# Patient Record
Sex: Female | Born: 1950 | Race: White | Hispanic: No | Smoking: Never smoker
Health system: Southern US, Community
[De-identification: ages and names within clinical notes are randomized; demographics above are authoritative.]

## PROBLEM LIST (undated history)

## (undated) DIAGNOSIS — T7840XA Allergy, unspecified, initial encounter: Secondary | ICD-10-CM

## (undated) DIAGNOSIS — F419 Anxiety disorder, unspecified: Secondary | ICD-10-CM

## (undated) DIAGNOSIS — F32A Depression, unspecified: Secondary | ICD-10-CM

## (undated) DIAGNOSIS — I10 Essential (primary) hypertension: Secondary | ICD-10-CM

## (undated) DIAGNOSIS — R519 Headache, unspecified: Secondary | ICD-10-CM

## (undated) DIAGNOSIS — K219 Gastro-esophageal reflux disease without esophagitis: Secondary | ICD-10-CM

## (undated) DIAGNOSIS — E785 Hyperlipidemia, unspecified: Secondary | ICD-10-CM

## (undated) DIAGNOSIS — M8580 Other specified disorders of bone density and structure, unspecified site: Secondary | ICD-10-CM

## (undated) DIAGNOSIS — R51 Headache: Secondary | ICD-10-CM

## (undated) DIAGNOSIS — J45909 Unspecified asthma, uncomplicated: Secondary | ICD-10-CM

## (undated) DIAGNOSIS — E079 Disorder of thyroid, unspecified: Secondary | ICD-10-CM

## (undated) DIAGNOSIS — M1990 Unspecified osteoarthritis, unspecified site: Secondary | ICD-10-CM

## (undated) DIAGNOSIS — R160 Hepatomegaly, not elsewhere classified: Secondary | ICD-10-CM

## (undated) HISTORY — DX: Depression, unspecified: F32.A

## (undated) HISTORY — DX: Gastro-esophageal reflux disease without esophagitis: K21.9

## (undated) HISTORY — DX: Headache, unspecified: R51.9

## (undated) HISTORY — DX: Anxiety disorder, unspecified: F41.9

## (undated) HISTORY — DX: Essential (primary) hypertension: I10

## (undated) HISTORY — DX: Hyperlipidemia, unspecified: E78.5

## (undated) HISTORY — DX: Unspecified asthma, uncomplicated: J45.909

## (undated) HISTORY — DX: Hepatomegaly, not elsewhere classified: R16.0

## (undated) HISTORY — DX: Allergy, unspecified, initial encounter: T78.40XA

## (undated) HISTORY — PX: DILATION AND CURETTAGE OF UTERUS: SHX78

## (undated) HISTORY — DX: Disorder of thyroid, unspecified: E07.9

## (undated) HISTORY — DX: Headache: R51

## (undated) HISTORY — PX: OTHER SURGICAL HISTORY: SHX169

## (undated) HISTORY — DX: Unspecified osteoarthritis, unspecified site: M19.90

## (undated) HISTORY — DX: Other specified disorders of bone density and structure, unspecified site: M85.80

---

## 1984-06-03 HISTORY — PX: NASAL SINUS SURGERY: SHX719

## 1998-01-18 ENCOUNTER — Encounter: Admission: RE | Admit: 1998-01-18 | Discharge: 1998-04-18 | Payer: Self-pay | Admitting: Internal Medicine

## 1999-06-01 ENCOUNTER — Encounter: Admission: RE | Admit: 1999-06-01 | Discharge: 1999-06-01 | Payer: Self-pay | Admitting: Internal Medicine

## 1999-06-01 ENCOUNTER — Encounter: Payer: Self-pay | Admitting: Internal Medicine

## 1999-06-06 ENCOUNTER — Encounter: Admission: RE | Admit: 1999-06-06 | Discharge: 1999-06-27 | Payer: Self-pay | Admitting: Internal Medicine

## 1999-11-28 ENCOUNTER — Other Ambulatory Visit: Admission: RE | Admit: 1999-11-28 | Discharge: 1999-11-28 | Payer: Self-pay | Admitting: Obstetrics & Gynecology

## 2001-01-28 ENCOUNTER — Encounter: Payer: Self-pay | Admitting: Internal Medicine

## 2001-01-28 ENCOUNTER — Encounter: Admission: RE | Admit: 2001-01-28 | Discharge: 2001-01-28 | Payer: Self-pay | Admitting: Internal Medicine

## 2001-04-28 ENCOUNTER — Other Ambulatory Visit: Admission: RE | Admit: 2001-04-28 | Discharge: 2001-04-28 | Payer: Self-pay | Admitting: Obstetrics & Gynecology

## 2002-05-03 ENCOUNTER — Other Ambulatory Visit: Admission: RE | Admit: 2002-05-03 | Discharge: 2002-05-03 | Payer: Self-pay | Admitting: Obstetrics & Gynecology

## 2002-08-20 ENCOUNTER — Encounter: Admission: RE | Admit: 2002-08-20 | Discharge: 2002-08-20 | Payer: Self-pay | Admitting: Internal Medicine

## 2002-08-20 ENCOUNTER — Encounter: Payer: Self-pay | Admitting: Internal Medicine

## 2003-05-18 ENCOUNTER — Other Ambulatory Visit: Admission: RE | Admit: 2003-05-18 | Discharge: 2003-05-18 | Payer: Self-pay | Admitting: Obstetrics & Gynecology

## 2004-05-29 ENCOUNTER — Other Ambulatory Visit: Admission: RE | Admit: 2004-05-29 | Discharge: 2004-05-29 | Payer: Self-pay | Admitting: Obstetrics & Gynecology

## 2004-07-16 ENCOUNTER — Ambulatory Visit (HOSPITAL_COMMUNITY): Admission: RE | Admit: 2004-07-16 | Discharge: 2004-07-16 | Payer: Self-pay | Admitting: Gastroenterology

## 2004-07-16 LAB — HM COLONOSCOPY

## 2004-08-07 ENCOUNTER — Other Ambulatory Visit: Admission: RE | Admit: 2004-08-07 | Discharge: 2004-08-07 | Payer: Self-pay | Admitting: Obstetrics & Gynecology

## 2005-01-11 ENCOUNTER — Encounter: Admission: RE | Admit: 2005-01-11 | Discharge: 2005-01-11 | Payer: Self-pay | Admitting: Internal Medicine

## 2005-07-14 ENCOUNTER — Ambulatory Visit: Payer: Self-pay | Admitting: Cardiology

## 2005-07-14 ENCOUNTER — Observation Stay (HOSPITAL_COMMUNITY): Admission: EM | Admit: 2005-07-14 | Discharge: 2005-07-15 | Payer: Self-pay | Admitting: Emergency Medicine

## 2005-07-15 ENCOUNTER — Ambulatory Visit: Payer: Self-pay

## 2006-10-04 ENCOUNTER — Emergency Department (HOSPITAL_COMMUNITY): Admission: EM | Admit: 2006-10-04 | Discharge: 2006-10-04 | Payer: Self-pay | Admitting: Family Medicine

## 2008-03-08 ENCOUNTER — Ambulatory Visit: Payer: Self-pay | Admitting: Internal Medicine

## 2008-09-27 ENCOUNTER — Ambulatory Visit: Payer: Self-pay | Admitting: Internal Medicine

## 2008-10-17 ENCOUNTER — Ambulatory Visit: Payer: Self-pay | Admitting: Internal Medicine

## 2009-03-22 ENCOUNTER — Ambulatory Visit: Payer: Self-pay | Admitting: Internal Medicine

## 2009-05-02 ENCOUNTER — Ambulatory Visit: Payer: Self-pay | Admitting: Internal Medicine

## 2009-05-09 ENCOUNTER — Ambulatory Visit: Payer: Self-pay | Admitting: Internal Medicine

## 2009-11-02 ENCOUNTER — Ambulatory Visit: Payer: Self-pay | Admitting: Internal Medicine

## 2010-03-23 ENCOUNTER — Ambulatory Visit: Payer: Self-pay | Admitting: Internal Medicine

## 2010-05-21 ENCOUNTER — Ambulatory Visit: Payer: Self-pay | Admitting: Internal Medicine

## 2010-08-06 ENCOUNTER — Ambulatory Visit (INDEPENDENT_AMBULATORY_CARE_PROVIDER_SITE_OTHER): Payer: 59 | Admitting: Internal Medicine

## 2010-09-06 ENCOUNTER — Ambulatory Visit (INDEPENDENT_AMBULATORY_CARE_PROVIDER_SITE_OTHER): Payer: 59 | Admitting: Internal Medicine

## 2010-09-06 DIAGNOSIS — R42 Dizziness and giddiness: Secondary | ICD-10-CM

## 2010-09-06 DIAGNOSIS — J209 Acute bronchitis, unspecified: Secondary | ICD-10-CM

## 2010-09-19 ENCOUNTER — Ambulatory Visit (INDEPENDENT_AMBULATORY_CARE_PROVIDER_SITE_OTHER): Payer: 59

## 2010-09-19 ENCOUNTER — Inpatient Hospital Stay (INDEPENDENT_AMBULATORY_CARE_PROVIDER_SITE_OTHER)
Admission: RE | Admit: 2010-09-19 | Discharge: 2010-09-19 | Disposition: A | Discharge status: Home / Self Care | Payer: 59 | Source: Ambulatory Visit | Attending: Family Medicine | Admitting: Family Medicine

## 2010-09-19 DIAGNOSIS — J45909 Unspecified asthma, uncomplicated: Secondary | ICD-10-CM

## 2010-09-27 ENCOUNTER — Ambulatory Visit (INDEPENDENT_AMBULATORY_CARE_PROVIDER_SITE_OTHER): Payer: 59 | Admitting: Internal Medicine

## 2010-09-27 DIAGNOSIS — J209 Acute bronchitis, unspecified: Secondary | ICD-10-CM

## 2010-10-19 NOTE — Consult Note (Signed)
Olivia Bender, Olivia Bender                ACCOUNT NO.:  [id number]   MEDICAL RECORD NO.:  [id number]          PATIENT TYPE:  OBV   LOCATION:  3703                         FACILITY:  [facility]   PHYSICIAN:  Pricilla Riffle, M.D.    DATE OF BIRTH:  09/24/1950   DATE OF CONSULTATION:  DATE OF DISCHARGE:  07/15/2005                                   CONSULTATION   PRIMARY CARDIOLOGIST:  Dr. Dietrich Pates.   PRIMARY CARE:  Dr. Marlan Palau.   DISCHARGING DIAGNOSES:  1.  Chest pain with both typical and atypical features with cardiac enzymes      negative x3 sets.  2.  Hyperlipidemia.  3.  Gastroesophageal reflux disease.  4.  Anxiety.  5.  History of palpitations.   HOSPITAL COURSE:  Olivia Bender is a very pleasant 60 year old Caucasian female  with no known coronary artery disease history, who woke up on day of  admission complaining of chest pain.  The patient states the symptoms were  worse with deep inspiration and use of left arm.  Olivia Bender has a long  history of brief sharp pains in chest and was actually evaluated by Dr. Tenny Craw  in 2003 with a benign workup.  Her pain on admission, however, was an 8 with  pressure.  She rated it a 5-6 on a scale of 1-10 that started at around 7  a.m.  She eventually took 2 sublingual nitroglycerin that belonged to her  husband.  She states the second nitroglycerin relieved her chest discomfort.  She presented to [facility] for further evaluation and was admitted to  telemetry, ruled out for a myocardial infarction.  Dr. Tenny Craw in to examine  patient prior to discharge.  The patient without further episodes of  discomfort, up ambulating in halls, telemetry showing normal sinus rhythm, a  12-lead EKG without ST or T-wave changes.  Hemoglobin stable at 12.4 and  hematocrit 35.4.  The patient afebrile, blood pressure 112/60 and satting  95% on room air.  The patient is being discharged home with saline lock  intact.  We have her scheduled for an exercise Myoview in our  office at  12:30 today and she has been n.p.o. for this test.  She is to continue her  previous medications including Protonix, Lexapro, Zyrtec, Zetia and Fosamax.  I have started her on a low-dose aspirin 81 mg daily, gave her a  prescription  for nitroglycerin if needed for chest discomfort, I have scheduled her for a  follow up appointment with April Humphry on February 13 at 9:45 to discuss  the results of her Myoview.   DURATION OF DISCHARGE ENCOUNTER:  Twenty minutes.      Dorian Pod, NP    ______________________________  Pricilla Riffle, M.D.    MB/MEDQ  D:  07/15/2005  T:  07/15/2005  Job:  [id number]   cc:   Pricilla Riffle, M.D.  [address]  [address]  [place]  [place] [address]   Luanna Cole. Lenord Fellers, M.D.  Fax: [phone]

## 2010-10-19 NOTE — Op Note (Signed)
Olivia Bender, Olivia Bender                ACCOUNT NO.:  [id number]   MEDICAL RECORD NO.:  [id number]          PATIENT TYPE:  AMB   LOCATION:  ENDO                         FACILITY:  [facility]   PHYSICIAN:  Anselmo Rod, M.D.  DATE OF BIRTH:  1951/05/29   DATE OF PROCEDURE:  07/16/2004  DATE OF DISCHARGE:                                 OPERATIVE REPORT   PROCEDURE PERFORMED:  Screening colonoscopy.   ENDOSCOPIST:  Anselmo Rod, M.D.   INSTRUMENT USED:  Olympus video colonoscope.   INDICATIONS FOR PROCEDURE:  A 60 year old white female undergoing a  screening colonoscopy.  The patient has a history of occasional BRPPR which  she attributes to hemorrhoids.  Rule out colonic polyps, masses, etc.   PRE-PROCEDURE PREPARATION:  Informed consent was procured from the patient.  Risks and benefits of the procedure, including a 10% miss rate of cancer and  polyps, was discussed with her as well.  The patient was fasted for eight  hours prior to the procedure after being prepped with a bottle of magnesium  citrate and a gallon of GoLYTELY the night prior to the procedure.   PRE-PROCEDURE PHYSICAL:  VITAL SIGNS:  The patient had stable vital signs.  NECK:  Supple.  CHEST:  Clear to auscultation.  CARDIOVASCULAR:  S1, S2 regular.  ABDOMEN:  Soft, with normal bowel sounds.   DESCRIPTION OF THE PROCEDURE:  The patient was placed in the left lateral  decubitus position, sedated with 60 mg of Demerol and 6 mg of Versed in slow  incremental doses.  Once the patient was adequately sedated and maintained  on low-flow oxygen and continuous cardiac monitoring, the Olympus video  colonoscope was advanced from the rectum to the cecum.  The appendiceal  orifice and the ileocecal valve were clearly visualized and photographed.  The patient's position was changed from the left lateral to the supine  position, with gentle application of abdominal pressure to reach the cecal  base.  No masses, polyps,  erosions, ulcerations, or diverticula were seen.  Small internal hemorrhoids were appreciated on retroflexion in the rectum.  The patient tolerated the procedure well, without immediate complications.   IMPRESSION:  1.  Normal colonoscopy up to the cecum, except for small internal      hemorrhoids.  2.  No masses, polyps, or diverticula seen.   RECOMMENDATIONS:  1.  Continue a high-fiber diet, with liberal fluid intake.  2.  Repeat colonoscopy in the next 10 years unless the patient develops any      abnormal symptoms in the interim.  3.  Outpatient followup as need arises in the future.      JNM/MEDQ  D:  07/16/2004  T:  07/16/2004  Job:  [id number]   cc:   Luanna Cole. Lenord Fellers, M.D.  [address] [place]  [place]  [place] [address]  Fax: [phone]

## 2010-11-02 ENCOUNTER — Other Ambulatory Visit: Payer: Self-pay | Admitting: Internal Medicine

## 2010-11-02 DIAGNOSIS — K219 Gastro-esophageal reflux disease without esophagitis: Secondary | ICD-10-CM

## 2010-11-05 ENCOUNTER — Ambulatory Visit (INDEPENDENT_AMBULATORY_CARE_PROVIDER_SITE_OTHER): Payer: 59 | Admitting: Internal Medicine

## 2010-11-05 ENCOUNTER — Encounter: Payer: Self-pay | Admitting: Internal Medicine

## 2010-11-05 DIAGNOSIS — E039 Hypothyroidism, unspecified: Secondary | ICD-10-CM

## 2010-11-05 DIAGNOSIS — M8580 Other specified disorders of bone density and structure, unspecified site: Secondary | ICD-10-CM

## 2010-11-05 DIAGNOSIS — J309 Allergic rhinitis, unspecified: Secondary | ICD-10-CM

## 2010-11-05 DIAGNOSIS — K219 Gastro-esophageal reflux disease without esophagitis: Secondary | ICD-10-CM

## 2010-11-05 DIAGNOSIS — E7800 Pure hypercholesterolemia, unspecified: Secondary | ICD-10-CM

## 2010-11-05 DIAGNOSIS — M899 Disorder of bone, unspecified: Secondary | ICD-10-CM

## 2010-11-05 DIAGNOSIS — Z0000 Encounter for general adult medical examination without abnormal findings: Secondary | ICD-10-CM

## 2010-11-05 DIAGNOSIS — J45909 Unspecified asthma, uncomplicated: Secondary | ICD-10-CM

## 2010-11-05 DIAGNOSIS — E785 Hyperlipidemia, unspecified: Secondary | ICD-10-CM

## 2010-11-05 LAB — LIPID PANEL
HDL: 49 mg/dL (ref >39)
LDL Cholesterol: 111 mg/dL — ABNORMAL HIGH (ref 0–99)
Total CHOL/HDL Ratio: 3.7 Ratio

## 2010-11-05 LAB — COMPREHENSIVE METABOLIC PANEL
ALT: 34 U/L (ref 0–35)
AST: 33 U/L (ref 0–37)
Alkaline Phosphatase: 52 U/L (ref 39–117)
CO2: 30 mEq/L (ref 19–32)
Creat: 0.71 mg/dL (ref 0.50–1.10)
Sodium: 140 mEq/L (ref 135–145)
Total Bilirubin: 0.5 mg/dL (ref 0.3–1.2)
Total Protein: 6.6 g/dL (ref 6.0–8.3)

## 2010-11-05 LAB — CBC WITH DIFFERENTIAL/PLATELET
Basophils Absolute: 0 10*3/uL (ref 0.0–0.1)
Eosinophils Absolute: 0.1 10*3/uL (ref 0.0–0.7)
Eosinophils Relative: 2 % (ref 0–5)
HCT: 37.4 % (ref 36.0–46.0)
Lymphocytes Relative: 30 % (ref 12–46)
MCH: 30.4 pg (ref 26.0–34.0)
MCV: 98.9 fL (ref 78.0–100.0)
Monocytes Absolute: 0.6 10*3/uL (ref 0.1–1.0)
Platelets: 246 10*3/uL (ref 150–400)
RDW: 14 % (ref 11.5–15.5)
WBC: 5.9 10*3/uL (ref 4.0–10.5)

## 2010-11-05 LAB — VITAMIN D 25 HYDROXY (VIT D DEFICIENCY, FRACTURES): Vit D, 25-Hydroxy: 37 ng/mL (ref 30–89)

## 2010-11-05 LAB — POCT URINALYSIS DIPSTICK
Bilirubin, UA: NEGATIVE
Blood, UA: NEGATIVE
Glucose, UA: NEGATIVE
Nitrite, UA: NEGATIVE

## 2010-11-05 LAB — TSH: TSH: 1.08 u[IU]/mL (ref 0.350–4.500)

## 2010-11-06 ENCOUNTER — Encounter: Payer: Self-pay | Admitting: Internal Medicine

## 2010-12-01 ENCOUNTER — Encounter: Payer: Self-pay | Admitting: Internal Medicine

## 2010-12-01 DIAGNOSIS — J309 Allergic rhinitis, unspecified: Secondary | ICD-10-CM | POA: Insufficient documentation

## 2010-12-01 DIAGNOSIS — E039 Hypothyroidism, unspecified: Secondary | ICD-10-CM | POA: Insufficient documentation

## 2010-12-01 DIAGNOSIS — K219 Gastro-esophageal reflux disease without esophagitis: Secondary | ICD-10-CM | POA: Insufficient documentation

## 2010-12-01 DIAGNOSIS — E785 Hyperlipidemia, unspecified: Secondary | ICD-10-CM | POA: Insufficient documentation

## 2010-12-01 DIAGNOSIS — M8580 Other specified disorders of bone density and structure, unspecified site: Secondary | ICD-10-CM | POA: Insufficient documentation

## 2010-12-01 DIAGNOSIS — J45909 Unspecified asthma, uncomplicated: Secondary | ICD-10-CM | POA: Insufficient documentation

## 2010-12-01 NOTE — Progress Notes (Signed)
  Subjective:    Patient ID: Olivia Bender, female    DOB: 1951-02-16, 60 y.o.   MRN: [id number]  HPI pleasant white female with history of hyperlipidemia, osteoarthritis of hands, allergic rhinitis, osteopenia, headaches, hypothyroidism, anxiety, constipation. Patient has had considerable problems with persistent cough and congestion for several weeks. Recently saw allergist 10/02/2010. Has taken immunotherapy in the past. Despite being on antibiotics, and Advair inhaler and albuterol inhaler, patient continued to have cough. Allergist changed patient to Alvesco aerosol solution and Advair inhaler was discontinued. Patient has gotten some better after steroid taper with Medrol for 5 days. Recent chest x-ray within normal limits. Patient took approximately five-year course of Fosamax. This was stopped in 2010. Maintained on Synthroid 0.05 mg daily since 2000 and when diagnosed with hypothyroidism.    Review of Systems     Objective:   Physical Exam HEENT exam: TMs clear pharynx is clear; neck supple no thyromegaly no carotid bruits; chest clear; cardiac exam regular rate and rhythm normal S1/S2; extremities without edema        Assessment & Plan:  Allergic rhinitis  Allergic bronchitis  Hyperlipidemia   GE reflux  Hypothyroidism  Osteoarthritis hands  Headaches  Constipation  Osteopenia  Plan patient will continue to see allergist and take medications as prescribed. Return in 6 months for physical examination here. Recommend annual influenza immunization. Consider Pneumovax immunization.

## 2011-01-13 ENCOUNTER — Other Ambulatory Visit: Payer: Self-pay | Admitting: Internal Medicine

## 2011-04-01 ENCOUNTER — Other Ambulatory Visit: Payer: Self-pay | Admitting: Internal Medicine

## 2011-04-01 NOTE — Telephone Encounter (Signed)
Pull chart and see if due for OV 

## 2011-05-07 ENCOUNTER — Encounter: Payer: Self-pay | Admitting: Internal Medicine

## 2011-05-07 ENCOUNTER — Ambulatory Visit (INDEPENDENT_AMBULATORY_CARE_PROVIDER_SITE_OTHER): Payer: 59 | Admitting: Internal Medicine

## 2011-05-07 VITALS — BP 144/84 | HR 76 | Temp 98.2°F | Wt 196.5 lb

## 2011-05-07 DIAGNOSIS — J309 Allergic rhinitis, unspecified: Secondary | ICD-10-CM

## 2011-05-07 DIAGNOSIS — J45909 Unspecified asthma, uncomplicated: Secondary | ICD-10-CM

## 2011-05-07 DIAGNOSIS — F411 Generalized anxiety disorder: Secondary | ICD-10-CM

## 2011-05-07 DIAGNOSIS — F419 Anxiety disorder, unspecified: Secondary | ICD-10-CM

## 2011-05-07 DIAGNOSIS — K219 Gastro-esophageal reflux disease without esophagitis: Secondary | ICD-10-CM

## 2011-05-07 DIAGNOSIS — E039 Hypothyroidism, unspecified: Secondary | ICD-10-CM

## 2011-05-07 DIAGNOSIS — E785 Hyperlipidemia, unspecified: Secondary | ICD-10-CM

## 2011-05-07 LAB — LIPID PANEL
Cholesterol: 261 mg/dL — ABNORMAL HIGH (ref 0–200)
LDL Cholesterol: 182 mg/dL — ABNORMAL HIGH (ref 0–99)
Triglycerides: 97 mg/dL (ref <150)
VLDL: 19 mg/dL (ref 0–40)

## 2011-05-07 NOTE — Progress Notes (Signed)
Addended by: Judy Pimple on: 05/07/2011 10:58 AM   Modules accepted: Orders

## 2011-05-07 NOTE — Patient Instructions (Signed)
Try Crestor 10 mg daily or hyperlipidemia. Use triamcinolone cream and use a run 3 times daily for rash until resolved. Continue same dose of Synthroid. We have not given influenza immunization because of possible allergic reaction last year. Call us if he developed flulike symptoms. Take Xanax 3 times a day for anxiety. Return in 6 months for physical exam.

## 2011-05-07 NOTE — Progress Notes (Signed)
  Subjective:    Patient ID: Olivia Bender, female    DOB: Jul 16, 1950, 60 y.o.   MRN: [id number]  HPI 60 year old white female with history of anxiety, hyperlipidemia, allergic rhinitis, asthma, osteopenia, hypothyroidism for six-month recheck. Has been unable to take a number of statin medications do to myalgias. Last when she tried was Livalo. Has not tried Crestor so I gave her prescription for Crestor 10 mg daily to try. Currently not on any statin medication. Lipid panel checked today. She has developed a nonspecific rash on the left lateral upper arm, abdomen, right arm. Her father was killed in a normal bili accident 02/12/2011 and her mother has had knee surgery. Mother is now living with her and is 17 years old. Patient is tearful in the office today. She used to have some Xanax to take for anxiety  but ran out. She is afraid to take an influenza immunization because last year after she took it hurts tongue swelled. She's also allergic to tetanus toxoid. Husband has chronic medical problems that worry her as well.    Review of Systems     Objective:   Physical Exam has macular papular rash on abdomen just below the umbilicus right upper lateral arm and left upper lateral arm and inside left arm. Says rash is itchy. Has not responded to calamine lotion or 1% over-the-counter hydrocortisone cream.  Chest is clear; cardiac exam regular rate and rhythm; neck no thyromegaly        Assessment & Plan:  Hypothyroidism  GE reflux  Anxiety  Eczema  Asthma  Allergic rhinitis  Hyperlipidemia  Plan: Triamcinolone cream 0.1% 60 g in 4 ounces of used to run to use 3 times a day on rash with one refill. Try Crestor 10 mg daily. Return in 6 months for physical examination and fasting lab work. Xanax 0.5 mg (#90 (1 by mouth 3 times a day with 3 refills. TSH drawn today as well as fasting lipid panel.

## 2011-09-28 ENCOUNTER — Other Ambulatory Visit: Payer: Self-pay | Admitting: Internal Medicine

## 2011-11-14 ENCOUNTER — Ambulatory Visit (INDEPENDENT_AMBULATORY_CARE_PROVIDER_SITE_OTHER): Payer: 59 | Admitting: Internal Medicine

## 2011-11-14 ENCOUNTER — Other Ambulatory Visit: Payer: 59 | Admitting: Internal Medicine

## 2011-11-14 ENCOUNTER — Encounter: Payer: Self-pay | Admitting: Internal Medicine

## 2011-11-14 VITALS — BP 126/84 | HR 92 | Temp 99.2°F | Ht 60.5 in | Wt 206.0 lb

## 2011-11-14 DIAGNOSIS — E039 Hypothyroidism, unspecified: Secondary | ICD-10-CM

## 2011-11-14 DIAGNOSIS — Z0000 Encounter for general adult medical examination without abnormal findings: Secondary | ICD-10-CM

## 2011-11-14 LAB — POCT URINALYSIS DIPSTICK
Bilirubin, UA: NEGATIVE
Glucose, UA: NEGATIVE
Leukocytes, UA: NEGATIVE
Nitrite, UA: NEGATIVE
pH, UA: 6

## 2011-11-14 LAB — CBC WITH DIFFERENTIAL/PLATELET
Hemoglobin: 11.7 g/dL — ABNORMAL LOW (ref 12.0–15.0)
Lymphocytes Relative: 37 % (ref 12–46)
Lymphs Abs: 1.7 10*3/uL (ref 0.7–4.0)
MCH: 31 pg (ref 26.0–34.0)
MCV: 95.5 fL (ref 78.0–100.0)
Monocytes Relative: 10 % (ref 3–12)
Neutrophils Relative %: 50 % (ref 43–77)
Platelets: 235 10*3/uL (ref 150–400)
RBC: 3.78 MIL/uL — ABNORMAL LOW (ref 3.87–5.11)
WBC: 4.5 10*3/uL (ref 4.0–10.5)

## 2011-11-14 LAB — LIPID PANEL
Cholesterol: 241 mg/dL — ABNORMAL HIGH (ref 0–200)
Triglycerides: 165 mg/dL — ABNORMAL HIGH (ref <150)

## 2011-11-14 LAB — COMPREHENSIVE METABOLIC PANEL
ALT: 58 U/L — ABNORMAL HIGH (ref 0–35)
Albumin: 4 g/dL (ref 3.5–5.2)
CO2: 27 mEq/L (ref 19–32)
Calcium: 9.2 mg/dL (ref 8.4–10.5)
Chloride: 109 mEq/L (ref 96–112)
Glucose, Bld: 91 mg/dL (ref 70–99)
Sodium: 143 mEq/L (ref 135–145)
Total Protein: 6.5 g/dL (ref 6.0–8.3)

## 2011-11-14 NOTE — Patient Instructions (Addendum)
Need to diet and exercise. Lose weight. Return in 6 months.

## 2011-11-15 LAB — VITAMIN D 25 HYDROXY (VIT D DEFICIENCY, FRACTURES): Vit D, 25-Hydroxy: 39 ng/mL (ref 30–89)

## 2011-11-22 ENCOUNTER — Other Ambulatory Visit: Payer: Self-pay | Admitting: Internal Medicine

## 2012-02-03 NOTE — Progress Notes (Signed)
Subjective:    Patient ID: Olivia Bender, female    DOB: [birth year], 61 y.o.   MRN: [id number]  HPI 61 year old white female with multiple medical problems in today for health maintenance exam and medical management. History of hyperlipidemia, allergic rhinitis and bronchitis, osteopenia, GE reflux, hypothyroidism. She's gained 10 pounds since December 2012. Is allergic to tetanus toxoid and cannot take prophylactic immunization for tetanus. Says Zocor causes leg and hip pain. Says Levaquin makes her constipated. Says she's allergic to latex. 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 Has frequent headaches. Has bunion of left foot. Had sinus surgery in [place] in 1986. History of left fractured foot 1985. Fractured left arm H. 12. Hospitalized for chest pain with MI ruled out February 2007. She took Fosamax for 4 years and stopped in 2010. Last bone density study April 2010. Dr. Arlyce Dice did colonoscopy December 2005 and Dr. Loreta Ave did colonoscopy 07/16/2004. Negative Cardiolite study February 2007. Diagnosed with hypothyroidism 2010.  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 Completed high school. He is married. Husband has had many medical problems involving his heart and this is been stressed to her. 2 adult sons. One sister and mother with history of headaches. Mother with history of hypertension and arthritis. Patient does not smoke or consume alcohol. And  GYN is Dr. Ilda Mori.  History of anxiety over situational stress.   Had allergy evaluation at [facility] in May 2012 used to take allergy injections in [place] and at [facility].  Had allergy testing several years ago at [facility] with minimal reactions. Repeated skin tests in 2012 not revealing any significant antigenicity. However patient complained of itchy, watery eyes, rhinorrhea, postnasal drip, and protracted cough. Was been started on Alvesco aerosol solution  instead of Advair. Advised to use Nasonex and Patanol nasl solution. Symptoms eventually improved. Tends to get bronchitis fairly easily.    Review of Systems  Constitutional: Positive for fatigue.       Weight gain  HENT:       Postnasal drip  Eyes:       Has had itchy eyes  Respiratory:       History of cough  Neurological:       History of headaches  Psychiatric/Behavioral: Positive for dysphoric mood.       Anxiety       Objective:   Physical Exam  Vitals reviewed. Constitutional: She is oriented to person, place, and time. She appears well-developed and well-nourished. No distress.  HENT:  Head: Normocephalic and atraumatic.  Right Ear: External ear normal.  Left Ear: External ear normal.  Mouth/Throat: Oropharynx is clear and moist.  Eyes: Conjunctivae are normal. Pupils are equal, round, and reactive to light. Right eye exhibits no discharge. Left eye exhibits no discharge. No scleral icterus.  Neck: No JVD present. No thyromegaly present.  Cardiovascular: Normal rate, regular rhythm, normal heart sounds and intact distal pulses.   No murmur heard. Pulmonary/Chest: Effort normal. She has wheezes. She has no rales.  Abdominal: Soft. Bowel sounds are normal. She exhibits no distension and no mass. There is no tenderness. There is no rebound and no guarding.  Genitourinary:       Deferred to Dr. Alysia Penna  Musculoskeletal: She exhibits no edema.  Lymphadenopathy:    She has no cervical adenopathy.  Neurological: She is alert and oriented to person, place, and time. She has normal reflexes. No cranial nerve deficit. Coordination normal.  Skin:  Skin is warm and dry. No rash noted. She is not diaphoretic.  Psychiatric: Her behavior is normal. Judgment and thought content normal.       Dysphoric mood          Assessment & Plan:  Situational stress with anxiety and depression. Issue seems to center around her mother is giving her a hard time and is requiring more  care. Mother is not being very cooperative. Patient will like her mother to move out of her home but so far she has not done that. Also has issues with husband's illness causing stress.  Hyperlipidemia  Osteopenia  History of bronchitis  GE reflux  Hypothyroidism  Plan: Patient needs refill on Xanax for anxiety. Continue same dose of Synthroid. Patient may need to consider counseling to deal with family stress. Return in 6 months or as needed.

## 2012-03-18 ENCOUNTER — Other Ambulatory Visit: Payer: Self-pay | Admitting: Internal Medicine

## 2012-05-15 ENCOUNTER — Ambulatory Visit (INDEPENDENT_AMBULATORY_CARE_PROVIDER_SITE_OTHER): Payer: 59 | Admitting: Internal Medicine

## 2012-05-15 ENCOUNTER — Other Ambulatory Visit: Payer: 59 | Admitting: Internal Medicine

## 2012-05-15 ENCOUNTER — Encounter: Payer: Self-pay | Admitting: Internal Medicine

## 2012-05-15 VITALS — BP 132/74 | HR 92 | Temp 98.6°F | Wt 201.0 lb

## 2012-05-15 DIAGNOSIS — M19049 Primary osteoarthritis, unspecified hand: Secondary | ICD-10-CM

## 2012-05-15 DIAGNOSIS — M79672 Pain in left foot: Secondary | ICD-10-CM

## 2012-05-15 DIAGNOSIS — K219 Gastro-esophageal reflux disease without esophagitis: Secondary | ICD-10-CM

## 2012-05-15 DIAGNOSIS — E785 Hyperlipidemia, unspecified: Secondary | ICD-10-CM

## 2012-05-15 DIAGNOSIS — E039 Hypothyroidism, unspecified: Secondary | ICD-10-CM

## 2012-05-15 DIAGNOSIS — M1710 Unilateral primary osteoarthritis, unspecified knee: Secondary | ICD-10-CM

## 2012-05-15 DIAGNOSIS — M79609 Pain in unspecified limb: Secondary | ICD-10-CM

## 2012-05-15 DIAGNOSIS — M1712 Unilateral primary osteoarthritis, left knee: Secondary | ICD-10-CM

## 2012-05-15 DIAGNOSIS — F411 Generalized anxiety disorder: Secondary | ICD-10-CM

## 2012-05-15 LAB — LIPID PANEL
HDL: 49 mg/dL (ref >39)
LDL Cholesterol: 201 mg/dL — ABNORMAL HIGH (ref 0–99)
Total CHOL/HDL Ratio: 5.7 Ratio
Triglycerides: 149 mg/dL (ref <150)
VLDL: 30 mg/dL (ref 0–40)

## 2012-05-15 NOTE — Patient Instructions (Addendum)
Call [facility] at [phone] for appointment regarding left knee pain and foot pain. Lab work drawn today off statin medication. Unable to tolerate Crestor because of myalgias. Blood work done for rheumatoid arthritis today at patient request

## 2012-05-15 NOTE — Progress Notes (Signed)
  Subjective:    Patient ID: Olivia Bender, female    DOB: 1951/01/05, 61 y.o.   MRN: [id number]  HPI For 6 month follow up of hyperlipidemia. History of hyperlipidemia, osteopenia, anxiety, hypothyroidism, GE reflux. Seems to have some chronic dysthymia associated with her anxiety. Worried about family issues. Discussed at length today. Also having considerable trouble with arthralgias. Arthritis studies will be drawn. Patient intolerant of Zocor. Has osteoarthritis of her hands. Has bunion of left foot. Took Fosamax for 4 years and stopped in 2010. Diagnosed with hypothyroidism in 2010. History of allergic rhinitis and used to take allergy injections. Repeat skin testing 2012 did not reveal any significant antigenicity. Patient was complaining of itchy watery eyes rhinorrhea and postnasal drip with cough. Allergist placed her on Alvesco aerosol solution instead of Advair. She was advised to use Nasonex and Patanol nasal solution and symptoms eventually improved. Could not tolerate Crestor because of myalgias. Therefore she has failed 2 statins and am not sure Zetia will help that much.    Review of Systems     Objective:   Physical Exam skin warm and dry. Heberden's and Bouchard's nodes both hands. No redness of joints. Neck is supple without thyromegaly JVD or carotid bruits. Chest clear to auscultation. Cardiac exam regular rate and rhythm. Extremities without edema.        Assessment & Plan:  Osteoarthritis of hands  Arthralgias-arthritis studies draw  Situational stress  Hyperlipidemia-unable to take Zocor do to musculoskeletal pain. Tried on other statins without success. Total cholesterol is 280 with an LDL cholesterol of 201. Patient has not been watching diet.  Hypothyroidism-TSH normal  Dysthymia and anxiety  Plan: Return in 6 months for physical exam and office visit. Arthritis studies drawn today including CCP to rule out rheumatoid arthritis. TSH is within normal limits.  Lipid panel is highly abnormal but she cannot tolerate statins. She needs to get serious about diet and exercis  Time spent with patient 30 minutese.

## 2012-05-18 LAB — ANA: Anti Nuclear Antibody(ANA): NEGATIVE

## 2012-05-19 NOTE — Progress Notes (Signed)
Patient informed of lab results. 

## 2012-10-19 ENCOUNTER — Other Ambulatory Visit: Payer: Self-pay | Admitting: Internal Medicine

## 2012-10-31 ENCOUNTER — Other Ambulatory Visit: Payer: Self-pay | Admitting: Internal Medicine

## 2012-11-16 ENCOUNTER — Other Ambulatory Visit: Payer: Self-pay | Admitting: Internal Medicine

## 2012-12-24 ENCOUNTER — Encounter: Payer: Self-pay | Admitting: Internal Medicine

## 2012-12-24 ENCOUNTER — Ambulatory Visit (INDEPENDENT_AMBULATORY_CARE_PROVIDER_SITE_OTHER): Payer: 59 | Admitting: Internal Medicine

## 2012-12-24 ENCOUNTER — Other Ambulatory Visit: Payer: 59 | Admitting: Internal Medicine

## 2012-12-24 VITALS — BP 132/80 | HR 88 | Temp 97.9°F | Ht 60.5 in | Wt 207.0 lb

## 2012-12-24 DIAGNOSIS — E039 Hypothyroidism, unspecified: Secondary | ICD-10-CM

## 2012-12-24 DIAGNOSIS — F329 Major depressive disorder, single episode, unspecified: Secondary | ICD-10-CM

## 2012-12-24 DIAGNOSIS — J45909 Unspecified asthma, uncomplicated: Secondary | ICD-10-CM

## 2012-12-24 DIAGNOSIS — M8580 Other specified disorders of bone density and structure, unspecified site: Secondary | ICD-10-CM

## 2012-12-24 DIAGNOSIS — Z130 Encounter for screening for diseases of the blood and blood-forming organs and certain disorders involving the immune mechanism: Secondary | ICD-10-CM

## 2012-12-24 DIAGNOSIS — E785 Hyperlipidemia, unspecified: Secondary | ICD-10-CM

## 2012-12-24 DIAGNOSIS — Z0000 Encounter for general adult medical examination without abnormal findings: Secondary | ICD-10-CM

## 2012-12-24 DIAGNOSIS — F32A Depression, unspecified: Secondary | ICD-10-CM

## 2012-12-24 DIAGNOSIS — J309 Allergic rhinitis, unspecified: Secondary | ICD-10-CM

## 2012-12-24 DIAGNOSIS — F439 Reaction to severe stress, unspecified: Secondary | ICD-10-CM

## 2012-12-24 DIAGNOSIS — Z733 Stress, not elsewhere classified: Secondary | ICD-10-CM

## 2012-12-24 LAB — POCT URINALYSIS DIPSTICK
Glucose, UA: NEGATIVE
Ketones, UA: NEGATIVE
Spec Grav, UA: 1.01
Urobilinogen, UA: NEGATIVE

## 2012-12-24 MED ORDER — ALPRAZOLAM 0.25 MG PO TABS: 0.2500 mg | ORAL_TABLET | Freq: Two times a day (BID) | ORAL | Status: DC | PRN

## 2012-12-24 MED ORDER — SERTRALINE HCL 50 MG PO TABS: ORAL_TABLET | ORAL | Status: DC

## 2012-12-24 NOTE — Patient Instructions (Addendum)
Take Xanax as needed for anxiety. Take Zoloft daily Return in 6 months

## 2012-12-24 NOTE — Progress Notes (Signed)
Subjective:    Patient ID: Olivia Bender, female    DOB: [birth year], 62 y.o.   MRN: [id number]  HPI  62 year old White female for health maintenance and evaluation of medical problems.  Mother and husband have medical problems . Mother has been in hospital and is now in nursing home. Pt is stressed with this.    Patient has history of hyperlipidemia and is statin intolerant, history of allergic rhinitis, allergic bronchitis, osteopenia, GE reflux, hypothyroidism. Says Zocor causes leg and hip pain. Says Levaquin makes her constipated. She is allergic to latex. 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 Has frequent headaches. Has bunion of left foot. Is allergic to tetanus toxoid.  Patient had sinus surgery [place] in 1986. History of left fractured foot 1985. Fractured left arm. Hospitalized for chest pain with MI being ruled out February 2007. She took Fosamax for 4 years and stopped in 2010. Had colonoscopy by Dr. Barnet Pall 2005, Dr. Loreta Ave did colonoscopy in February 2006. Had negative Cardiolite study February 2007. Diagnosed with hypothyroidism 2010.  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 Completed high school. She is married. Husband is had many medical problems involving his heart and this is been stressful. She has 2 adult sons. Does not smoke or consume alcohol.  Family history: One sister and mother with history of headaches. Mother with history of hypertension and arthritis.  GYN is Dr. Ilda Mori.  History of anxiety and situational stress.  Patient had allergy evaluation at [facility] May 2012. She is to take allergy injections in [place] and [place]. She had allergy testing several years ago with minimal reactions. Repeated skin testing 2012 not revealing any significant antigenicity. Patient was subsequently changed from Advair to L. [place] aerosol solution and 2012 after complaining of itchy watery  eyes rhinorrhea postnasal drip and protracted cough. Was advised to use Nasonex and Patanol nasal solution. Symptoms eventually improved. Tends to get bronchitis easily   Review of Systems  Constitutional: Positive for fatigue.  HENT: Negative.   Eyes: Negative.   Respiratory:       History of allergic rhinitis allergic bronchitis  Cardiovascular: Negative.   Gastrointestinal: Negative.   Endocrine:       History of hypothyroidism  Genitourinary: Negative.   Neurological:       Frequent headaches  Psychiatric/Behavioral:       Anxiety       Objective:   Physical Exam  Vitals reviewed. Constitutional: She is oriented to person, place, and time. She appears well-developed and well-nourished. No distress.  HENT:  Head: Normocephalic and atraumatic.  Right Ear: External ear normal.  Left Ear: External ear normal.  Nose: Nose normal.  Mouth/Throat: Oropharynx is clear and moist. No oropharyngeal exudate.  Eyes: Conjunctivae and EOM are normal. Pupils are equal, round, and reactive to light. Right eye exhibits no discharge. Left eye exhibits no discharge.  Neck: Neck supple. No JVD present. No thyromegaly present.  Cardiovascular: Normal rate and regular rhythm.   No murmur heard. Pulmonary/Chest: Breath sounds normal. No respiratory distress. She has no wheezes. She has no rales. She exhibits no tenderness.  Breasts normal female  Abdominal: Soft. Bowel sounds are normal. She exhibits no distension and no mass. There is no tenderness. There is no rebound and no guarding.  Genitourinary:  Deferred to Dr. Ilda Mori  Musculoskeletal: Normal range of motion. She exhibits no edema.  Lymphadenopathy:    She has  no cervical adenopathy.  Neurological: She is alert and oriented to person, place, and time. She has normal reflexes. She displays normal reflexes. No cranial nerve deficit. Coordination normal.  Skin: Skin is warm and dry. No rash noted. She is not diaphoretic.   Psychiatric: She has a normal mood and affect. Her behavior is normal. Judgment and thought content normal.          Assessment & Plan:   Situational stress. Main issues today are she is worried about husband's health and mother's health. Mother is demanding at times.  Anxiety  Depression-treat with Xanax and Zoloft  History of hyperlipidemia-intolerant of statin  History of allergy to tetanus toxoid and latex  Hypothyroidism  History of allergic rhinitis  History of allergic bronchitis  Plan: Return in 6 months. Continue same medications.

## 2012-12-25 LAB — COMPREHENSIVE METABOLIC PANEL
Albumin: 4.1 g/dL (ref 3.5–5.2)
BUN: 12 mg/dL (ref 6–23)
Calcium: 9.2 mg/dL (ref 8.4–10.5)
Chloride: 106 mEq/L (ref 96–112)
Glucose, Bld: 95 mg/dL (ref 70–99)
Potassium: 4 mEq/L (ref 3.5–5.3)

## 2012-12-25 LAB — CBC WITH DIFFERENTIAL/PLATELET
Basophils Relative: 1 % (ref 0–1)
HCT: 37.7 % (ref 36.0–46.0)
Hemoglobin: 12.2 g/dL (ref 12.0–15.0)
Lymphocytes Relative: 35 % (ref 12–46)
MCHC: 32.4 g/dL (ref 30.0–36.0)
Monocytes Absolute: 0.4 10*3/uL (ref 0.1–1.0)
Monocytes Relative: 7 % (ref 3–12)
Neutro Abs: 3 10*3/uL (ref 1.7–7.7)

## 2012-12-25 LAB — LIPID PANEL
HDL: 41 mg/dL (ref >39)
Triglycerides: 169 mg/dL — ABNORMAL HIGH (ref <150)

## 2013-04-18 ENCOUNTER — Other Ambulatory Visit: Payer: Self-pay | Admitting: Internal Medicine

## 2013-06-15 ENCOUNTER — Ambulatory Visit: Payer: 59 | Admitting: Internal Medicine

## 2013-07-01 ENCOUNTER — Ambulatory Visit (INDEPENDENT_AMBULATORY_CARE_PROVIDER_SITE_OTHER): Payer: 59 | Admitting: Internal Medicine

## 2013-07-01 ENCOUNTER — Encounter: Payer: Self-pay | Admitting: Internal Medicine

## 2013-07-01 VITALS — BP 138/90 | HR 80 | Temp 98.0°F | Wt 209.0 lb

## 2013-07-01 DIAGNOSIS — J209 Acute bronchitis, unspecified: Secondary | ICD-10-CM

## 2013-07-01 DIAGNOSIS — E039 Hypothyroidism, unspecified: Secondary | ICD-10-CM

## 2013-07-01 DIAGNOSIS — Z79899 Other long term (current) drug therapy: Secondary | ICD-10-CM

## 2013-07-01 DIAGNOSIS — Z131 Encounter for screening for diabetes mellitus: Secondary | ICD-10-CM

## 2013-07-01 DIAGNOSIS — R509 Fever, unspecified: Secondary | ICD-10-CM

## 2013-07-01 DIAGNOSIS — E785 Hyperlipidemia, unspecified: Secondary | ICD-10-CM

## 2013-07-01 LAB — LIPID PANEL
CHOL/HDL RATIO: 6 ratio
CHOLESTEROL: 266 mg/dL — AB (ref 0–200)
HDL: 44 mg/dL (ref >39)
LDL CALC: 181 mg/dL — AB (ref 0–99)
TRIGLYCERIDES: 206 mg/dL — AB (ref <150)
VLDL: 41 mg/dL — AB (ref 0–40)

## 2013-07-01 LAB — CBC WITH DIFFERENTIAL/PLATELET
Basophils Absolute: 0 10*3/uL (ref 0.0–0.1)
Basophils Relative: 0 % (ref 0–1)
Eosinophils Absolute: 0.2 10*3/uL (ref 0.0–0.7)
Eosinophils Relative: 3 % (ref 0–5)
HEMATOCRIT: 35.5 % — AB (ref 36.0–46.0)
HEMOGLOBIN: 11.8 g/dL — AB (ref 12.0–15.0)
Lymphocytes Relative: 28 % (ref 12–46)
Lymphs Abs: 1.6 10*3/uL (ref 0.7–4.0)
MCH: 30.7 pg (ref 26.0–34.0)
MCHC: 33.2 g/dL (ref 30.0–36.0)
MCV: 92.4 fL (ref 78.0–100.0)
MONO ABS: 0.6 10*3/uL (ref 0.1–1.0)
MONOS PCT: 11 % (ref 3–12)
NEUTROS ABS: 3.3 10*3/uL (ref 1.7–7.7)
Neutrophils Relative %: 58 % (ref 43–77)
Platelets: 229 10*3/uL (ref 150–400)
RBC: 3.84 MIL/uL — AB (ref 3.87–5.11)
RDW: 14.1 % (ref 11.5–15.5)
WBC: 5.7 10*3/uL (ref 4.0–10.5)

## 2013-07-01 LAB — HEPATIC FUNCTION PANEL
ALT: 71 U/L — ABNORMAL HIGH (ref 0–35)
AST: 64 U/L — ABNORMAL HIGH (ref 0–37)
Albumin: 3.9 g/dL (ref 3.5–5.2)
Alkaline Phosphatase: 66 U/L (ref 39–117)
Bilirubin, Direct: 0.1 mg/dL (ref 0.0–0.3)
Indirect Bilirubin: 0.5 mg/dL (ref 0.2–1.2)
TOTAL PROTEIN: 6.7 g/dL (ref 6.0–8.3)
Total Bilirubin: 0.6 mg/dL (ref 0.2–1.2)

## 2013-07-01 LAB — POCT URINALYSIS DIPSTICK
Bilirubin, UA: NEGATIVE
GLUCOSE UA: NEGATIVE
Ketones, UA: NEGATIVE
Nitrite, UA: NEGATIVE
PH UA: 5.5
PROTEIN UA: NEGATIVE
RBC UA: NEGATIVE
Spec Grav, UA: <=1.005
UROBILINOGEN UA: NEGATIVE

## 2013-07-01 LAB — HEMOGLOBIN A1C
HEMOGLOBIN A1C: 6 % — AB (ref <5.7)
MEAN PLASMA GLUCOSE: 126 mg/dL — AB (ref <117)

## 2013-07-01 LAB — TSH: TSH: 1.535 u[IU]/mL (ref 0.350–4.500)

## 2013-07-01 MED ORDER — HYDROCODONE-HOMATROPINE 5-1.5 MG/5ML PO SYRP: 5.0000 mL | ORAL_SOLUTION | Freq: Two times a day (BID) | ORAL | Status: DC

## 2013-07-01 MED ORDER — AZITHROMYCIN 250 MG PO TABS: ORAL_TABLET | ORAL | Status: DC

## 2013-07-01 NOTE — Patient Instructions (Signed)
Take steroids prescribed at urgent care. Take Z-pak as directed. Take Hycodan for cough.

## 2013-07-01 NOTE — Progress Notes (Signed)
   Subjective:    Patient ID: Olivia Bender, female    DOB: 01/06/1951, 63 y.o.   MRN: [id number]  HPI  63 year old white female in today for six-month recheck. Her mother died recently of a stroke within the past 6 months in her late 56s. Patient had flulike illness around the Christmas holidays and has had protracted dry cough with very little sputum production. Sometimes has a little bit of green sputum. Says she had fever and headache with that illness. She went to an urgent care at [facility] and was given a three-day course of steroids which she did not take and some Tessalon Perles which did not help her cough. She had a near syncopal episode at home before going there and was concerned about her blood sugar. I checked her urine today and it is negative for glucose or infection. She's here to followup on hypothyroidism, hyperlipidemia, GE reflux, allergic rhinitis.    Review of Systems     Objective:   Physical Exam skin is warm and dry. Nodes none. HEENT exam: Right TM is slightly full. Left TM clear. Pharynx clear. No JVD. Chest completely clear to auscultation without rales or wheezing.        Assessment & Plan:  Bronchitis  Hyperlipidemia-lipid panel pending.  Hypothyroidism-TSH pending  History of allergic rhinitis  Plan: I think I will hold off on giving her Pneumovax immunization until she is 63 years old. She has no history of chronic recurrent pneumonia that I think would warrant Pneumovax immunization at this point in time. She seldom has bronchitis. She declines influenza immunization due to adverse reaction in the past. CBC with differential drawn do to multiple complaints of cough, malaise, near syncope and headache. Urinalysis normal. Treat bronchitis with Zithromax Z-Pak take as directed with one refill. If not better in one week have prescription refilled. Says Levaquin makes her constipated. Hycodan 120 cc 1 teaspoon by mouth every 12 hours when necessary cough. Return  July 2015 for physical exam.

## 2013-10-10 ENCOUNTER — Other Ambulatory Visit: Payer: Self-pay | Admitting: Internal Medicine

## 2013-12-13 ENCOUNTER — Other Ambulatory Visit: Payer: Self-pay | Admitting: Internal Medicine

## 2013-12-14 ENCOUNTER — Other Ambulatory Visit: Payer: Self-pay | Admitting: Internal Medicine

## 2013-12-15 ENCOUNTER — Other Ambulatory Visit: Payer: Self-pay | Admitting: Internal Medicine

## 2013-12-27 ENCOUNTER — Ambulatory Visit (INDEPENDENT_AMBULATORY_CARE_PROVIDER_SITE_OTHER): Payer: 59 | Admitting: Internal Medicine

## 2013-12-27 ENCOUNTER — Encounter: Payer: Self-pay | Admitting: Internal Medicine

## 2013-12-27 ENCOUNTER — Other Ambulatory Visit: Payer: 59 | Admitting: Internal Medicine

## 2013-12-27 VITALS — BP 126/82 | HR 80 | Ht 60.25 in | Wt 211.0 lb

## 2013-12-27 DIAGNOSIS — Z0000 Encounter for general adult medical examination without abnormal findings: Secondary | ICD-10-CM

## 2013-12-27 DIAGNOSIS — Z130 Encounter for screening for diseases of the blood and blood-forming organs and certain disorders involving the immune mechanism: Secondary | ICD-10-CM

## 2013-12-27 DIAGNOSIS — E785 Hyperlipidemia, unspecified: Secondary | ICD-10-CM

## 2013-12-27 DIAGNOSIS — Z79899 Other long term (current) drug therapy: Secondary | ICD-10-CM

## 2013-12-27 DIAGNOSIS — E039 Hypothyroidism, unspecified: Secondary | ICD-10-CM

## 2013-12-27 DIAGNOSIS — Z13228 Encounter for screening for other metabolic disorders: Secondary | ICD-10-CM

## 2013-12-27 DIAGNOSIS — Z1329 Encounter for screening for other suspected endocrine disorder: Secondary | ICD-10-CM

## 2013-12-27 LAB — COMPREHENSIVE METABOLIC PANEL
ALBUMIN: 3.9 g/dL (ref 3.5–5.2)
ALT: 69 U/L — AB (ref 0–35)
AST: 64 U/L — AB (ref 0–37)
Alkaline Phosphatase: 69 U/L (ref 39–117)
BUN: 11 mg/dL (ref 6–23)
CALCIUM: 8.9 mg/dL (ref 8.4–10.5)
CHLORIDE: 106 meq/L (ref 96–112)
CO2: 26 meq/L (ref 19–32)
Creat: 0.77 mg/dL (ref 0.50–1.10)
Glucose, Bld: 94 mg/dL (ref 70–99)
POTASSIUM: 4.3 meq/L (ref 3.5–5.3)
Sodium: 140 mEq/L (ref 135–145)
Total Bilirubin: 0.4 mg/dL (ref 0.2–1.2)
Total Protein: 6.9 g/dL (ref 6.0–8.3)

## 2013-12-27 LAB — POCT URINALYSIS DIPSTICK
Bilirubin, UA: NEGATIVE
Blood, UA: NEGATIVE
GLUCOSE UA: NEGATIVE
Ketones, UA: NEGATIVE
LEUKOCYTES UA: NEGATIVE
NITRITE UA: NEGATIVE
Protein, UA: NEGATIVE
Spec Grav, UA: <=1.005
UROBILINOGEN UA: NEGATIVE
pH, UA: 6

## 2013-12-27 LAB — CBC WITH DIFFERENTIAL/PLATELET
BASOS ABS: 0.1 10*3/uL (ref 0.0–0.1)
Basophils Relative: 1 % (ref 0–1)
Eosinophils Absolute: 0.2 10*3/uL (ref 0.0–0.7)
Eosinophils Relative: 3 % (ref 0–5)
HCT: 37 % (ref 36.0–46.0)
HEMOGLOBIN: 12.2 g/dL (ref 12.0–15.0)
LYMPHS PCT: 41 % (ref 12–46)
Lymphs Abs: 2.1 10*3/uL (ref 0.7–4.0)
MCH: 30.4 pg (ref 26.0–34.0)
MCHC: 33 g/dL (ref 30.0–36.0)
MCV: 92.3 fL (ref 78.0–100.0)
MONO ABS: 0.5 10*3/uL (ref 0.1–1.0)
Monocytes Relative: 10 % (ref 3–12)
NEUTROS ABS: 2.3 10*3/uL (ref 1.7–7.7)
Neutrophils Relative %: 45 % (ref 43–77)
Platelets: 230 10*3/uL (ref 150–400)
RBC: 4.01 MIL/uL (ref 3.87–5.11)
RDW: 14 % (ref 11.5–15.5)
WBC: 5.2 10*3/uL (ref 4.0–10.5)

## 2013-12-27 LAB — LIPID PANEL
Cholesterol: 258 mg/dL — ABNORMAL HIGH (ref 0–200)
HDL: 47 mg/dL (ref >39)
LDL Cholesterol: 185 mg/dL — ABNORMAL HIGH (ref 0–99)
Total CHOL/HDL Ratio: 5.5 Ratio
Triglycerides: 131 mg/dL (ref <150)
VLDL: 26 mg/dL (ref 0–40)

## 2013-12-27 LAB — TSH: TSH: 2.656 u[IU]/mL (ref 0.350–4.500)

## 2013-12-27 MED ORDER — TRIAMCINOLONE ACETONIDE 0.1 % EX CREA: 1.0000 "application " | TOPICAL_CREAM | Freq: Three times a day (TID) | CUTANEOUS | Status: DC

## 2013-12-27 NOTE — Progress Notes (Signed)
Subjective:    Patient ID: Olivia Bender, female    DOB: 01/05/1951, 63 y.o.   MRN: [id number]  HPI  63 year old White Female for health maintenance and evaluation of medical issues. Hx of asthma seen by Dr. Harold Hedge at [facility] and is on ProAir inhaler and Qvar inhaler.  Hx hyperlipidemia  and is statin intolerant. Hx hypothyroidism. Fasting labs drawn and pending today. No new complaints. GYN is Dr. Evlyn Kanner. Hx of allergic rhinitis, allergic bronchitis, osteopenia and GE reflux. History of anxiety and situational stress.  She is allergic to latex. Says Levaquin makes her constipated. Is allergic to tetanus toxoid.  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 History of frequent headaches. Bunion left foot. Patient had sinus surgery in [place] in 1986. Left fractured foot 1985. History of fractured left arm. Hospitalized for chest pain with MI being ruled out February 2007. Had negative Cardiolite study February 2007. Diagnosed with hypothyroidism in 2010. Had colonoscopy by Dr. Collene Mares 2006. Took Fosamax for 4 years and stopped in 2010. Patient had allergy evaluation [facility] allergy in May 2012. Repeat skin testing did not reveal any significant antigenicity. She was changed from Advair to Alvesco inhaler and did better. She uses Patanol eyedrops and steroid nasal spray. She tends to get bronchitis easily.  Family History: Mother died from complications of stroke and rheumatoid arthritis last August. Both mother and sister have history of headaches. Mother had hypertension. She also had an enlarged heart.  Recently went to nail salon and had gel applied to nails. Apparently developed a contact dermatitis. Will prescribe triamcinolone cream 01% to use 3 times a day until resolved.  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 Completed high school. She is married. Husband has many medical problems including heart  disease and back pain. She has 2 adult sons. Does not smoke or consume alcohol. 40 year old grandson is living with her. He's been there now for about 8 months and she's found that helpful. One son is a Mudlogger.  Review of Systems  Constitutional: Negative.   HENT: Negative.   Eyes: Negative.   Respiratory: Negative.   Cardiovascular: Negative.   Gastrointestinal:       History of GERD  Endocrine:       History of hypothyroidism  Genitourinary: Negative.   Musculoskeletal: Negative.   Skin:       Redness and itching around nail beds after recent manicure  Psychiatric/Behavioral:       History of anxiety       Objective:   Physical Exam  Vitals reviewed. Constitutional: She is oriented to person, place, and time. She appears well-developed and well-nourished. No distress.  HENT:  Head: Normocephalic and atraumatic.  Right Ear: External ear normal.  Left Ear: External ear normal.  Mouth/Throat: Oropharynx is clear and moist. No oropharyngeal exudate.  Eyes: Conjunctivae and EOM are normal. Pupils are equal, round, and reactive to light. Right eye exhibits no discharge. Left eye exhibits no discharge. No scleral icterus.  Neck: Neck supple. No JVD present. No thyromegaly present.  Cardiovascular: Normal rate, normal heart sounds and intact distal pulses.   No murmur heard. Pulmonary/Chest: Effort normal and breath sounds normal. No respiratory distress. She has no wheezes. She has no rales. She exhibits no tenderness.  Breasts normal female  Abdominal: Soft. Bowel sounds are normal. She exhibits no distension and no mass. There is no tenderness. There is  no rebound and no guarding.  Genitourinary:  Deferred to Dr. Deatra Ina  Musculoskeletal: Normal range of motion. She exhibits no edema.  Lymphadenopathy:    She has no cervical adenopathy.  Neurological: She is alert and oriented to person, place, and time. She has normal reflexes. No cranial nerve deficit. Coordination normal.    Skin: Skin is warm and dry. She is not diaphoretic.  Redness about nails. Nails are thin from manicure procedures.  Psychiatric: She has a normal mood and affect. Her behavior is normal. Judgment and thought content normal.          Assessment & Plan:  Hypothyroidism-TSH stable  Hyperlipidemia-statin intolerant fasting lipid panel drawn and pending  GE reflux-continue PPI  Situational stress-improved  History of anxiety  Contact dermatitis distal fingertips-treat with triamcinolone 0.1% cream 30 g 3 times a day until resolved. Watch manicure products for reactions.  Obesity-needs to diet and exercise. Gained 4 pounds since last year  Plan: Patient was to return in 6 months for office visit ,weight check, TSH and fasting lipid panel

## 2013-12-27 NOTE — Patient Instructions (Signed)
Continue diet exercise and weight loss efforts. He had gained 4 pounds since last year. Return in 6 months for office visit, lipid panel and TSH. Continue same medications.

## 2013-12-28 LAB — VITAMIN D 25 HYDROXY (VIT D DEFICIENCY, FRACTURES): VIT D 25 HYDROXY: 48 ng/mL (ref 30–89)

## 2014-02-10 ENCOUNTER — Ambulatory Visit (INDEPENDENT_AMBULATORY_CARE_PROVIDER_SITE_OTHER): Payer: 59 | Admitting: Internal Medicine

## 2014-02-10 ENCOUNTER — Encounter: Payer: Self-pay | Admitting: Internal Medicine

## 2014-02-10 VITALS — BP 140/82 | HR 80 | Temp 97.7°F | Ht 60.25 in | Wt 210.0 lb

## 2014-02-10 DIAGNOSIS — N390 Urinary tract infection, site not specified: Secondary | ICD-10-CM

## 2014-02-10 MED ORDER — CEPHALEXIN 500 MG PO CAPS: 500.0000 mg | ORAL_CAPSULE | Freq: Four times a day (QID) | ORAL | Status: DC

## 2014-02-10 NOTE — Progress Notes (Signed)
   Subjective:    Patient ID: Olivia Bender, female    DOB: Mar 19, 1951, 63 y.o.   MRN: [id number]  HPI  Patient complaining of UTI symptoms. Has noted frequency and urgency. Some dysuria. No fever or chills    Review of Systems     Objective:   Physical Exam  Urinalysis is normal but patient is symptomatic. No CVA tenderness.      Assessment & Plan:  Urethritis  Possible cystitis  Plan: Urine culture. Keflex 500 mg 4 times daily for 7 days.  Addendum: Urine culture 50,000 Col/mL multiple species

## 2014-02-10 NOTE — Patient Instructions (Addendum)
Take Keflex 500 mg 4 times daily for 7 days for urinary infection. Culture pending.

## 2014-02-11 LAB — URINE CULTURE: Colony Count: 50000

## 2014-02-24 ENCOUNTER — Other Ambulatory Visit: Payer: Self-pay | Admitting: Gastroenterology

## 2014-02-24 DIAGNOSIS — R7989 Other specified abnormal findings of blood chemistry: Secondary | ICD-10-CM

## 2014-02-24 DIAGNOSIS — R945 Abnormal results of liver function studies: Principal | ICD-10-CM

## 2014-02-24 LAB — HM HEPATITIS C SCREENING LAB: HM Hepatitis Screen: NEGATIVE

## 2014-03-04 ENCOUNTER — Ambulatory Visit
Admission: RE | Admit: 2014-03-04 | Discharge: 2014-03-04 | Disposition: A | Discharge status: Home / Self Care | Payer: 59 | Source: Ambulatory Visit | Attending: Gastroenterology | Admitting: Gastroenterology

## 2014-03-04 ENCOUNTER — Encounter (INDEPENDENT_AMBULATORY_CARE_PROVIDER_SITE_OTHER): Payer: Self-pay

## 2014-03-04 DIAGNOSIS — R945 Abnormal results of liver function studies: Principal | ICD-10-CM

## 2014-03-04 DIAGNOSIS — R7989 Other specified abnormal findings of blood chemistry: Secondary | ICD-10-CM

## 2014-04-07 ENCOUNTER — Other Ambulatory Visit: Payer: Self-pay | Admitting: Internal Medicine

## 2014-06-30 ENCOUNTER — Encounter: Payer: Self-pay | Admitting: Internal Medicine

## 2014-06-30 ENCOUNTER — Ambulatory Visit (INDEPENDENT_AMBULATORY_CARE_PROVIDER_SITE_OTHER): Payer: 59 | Admitting: Internal Medicine

## 2014-06-30 VITALS — BP 126/82 | HR 84 | Temp 97.8°F | Wt 205.0 lb

## 2014-06-30 DIAGNOSIS — M25562 Pain in left knee: Secondary | ICD-10-CM | POA: Insufficient documentation

## 2014-06-30 DIAGNOSIS — K8020 Calculus of gallbladder without cholecystitis without obstruction: Secondary | ICD-10-CM | POA: Insufficient documentation

## 2014-06-30 DIAGNOSIS — E669 Obesity, unspecified: Secondary | ICD-10-CM

## 2014-06-30 DIAGNOSIS — K219 Gastro-esophageal reflux disease without esophagitis: Secondary | ICD-10-CM

## 2014-06-30 DIAGNOSIS — F411 Generalized anxiety disorder: Secondary | ICD-10-CM

## 2014-06-30 DIAGNOSIS — R7303 Prediabetes: Secondary | ICD-10-CM | POA: Insufficient documentation

## 2014-06-30 DIAGNOSIS — E785 Hyperlipidemia, unspecified: Secondary | ICD-10-CM

## 2014-06-30 DIAGNOSIS — R7309 Other abnormal glucose: Secondary | ICD-10-CM

## 2014-06-30 DIAGNOSIS — Z789 Other specified health status: Secondary | ICD-10-CM | POA: Insufficient documentation

## 2014-06-30 DIAGNOSIS — Z889 Allergy status to unspecified drugs, medicaments and biological substances status: Secondary | ICD-10-CM

## 2014-06-30 DIAGNOSIS — Z131 Encounter for screening for diabetes mellitus: Secondary | ICD-10-CM

## 2014-06-30 DIAGNOSIS — K760 Fatty (change of) liver, not elsewhere classified: Secondary | ICD-10-CM

## 2014-06-30 DIAGNOSIS — Z8739 Personal history of other diseases of the musculoskeletal system and connective tissue: Secondary | ICD-10-CM

## 2014-06-30 DIAGNOSIS — E039 Hypothyroidism, unspecified: Secondary | ICD-10-CM

## 2014-06-30 LAB — HEPATIC FUNCTION PANEL
ALBUMIN: 4.2 g/dL (ref 3.5–5.2)
ALT: 51 U/L — AB (ref 0–35)
AST: 55 U/L — AB (ref 0–37)
Alkaline Phosphatase: 78 U/L (ref 39–117)
BILIRUBIN DIRECT: 0.1 mg/dL (ref 0.0–0.3)
Indirect Bilirubin: 0.5 mg/dL (ref 0.2–1.2)
TOTAL PROTEIN: 7.1 g/dL (ref 6.0–8.3)
Total Bilirubin: 0.6 mg/dL (ref 0.2–1.2)

## 2014-06-30 LAB — HEMOGLOBIN A1C
HEMOGLOBIN A1C: 5.8 % — AB (ref <5.7)
MEAN PLASMA GLUCOSE: 120 mg/dL — AB (ref <117)

## 2014-06-30 LAB — TSH: TSH: 2.166 u[IU]/mL (ref 0.350–4.500)

## 2014-06-30 LAB — LIPID PANEL
Cholesterol: 269 mg/dL — ABNORMAL HIGH (ref 0–200)
HDL: 50 mg/dL (ref >39)
LDL Cholesterol: 193 mg/dL — ABNORMAL HIGH (ref 0–99)
TRIGLYCERIDES: 131 mg/dL (ref <150)
Total CHOL/HDL Ratio: 5.4 Ratio
VLDL: 26 mg/dL (ref 0–40)

## 2014-06-30 NOTE — Progress Notes (Signed)
   Subjective:    Patient ID: Olivia Bender, female    DOB: Oct 22, 1950, 64 y.o.   MRN: [id number]  HPI In today for six-month recheck. History of mild elevation of liver functions thought to be secondary to fatty liver. She did recently have ultrasound of the liver in October ordered by Dr. Collene Mares confirming fatty liver. Also was found to have cholelithiasis. This is asymptomatic. She is having colonoscopy in the near future. Unable to tolerate statin medication because of myalgias. Unable to exercise very much because of left knee pain and back pain. Has been trying to walk some on treadmill but bothers knee. I suggested she try 2 Advil twice daily. History of left knee surgery. Has not been back to orthopedist. I suggested she see orthopedist once again to see what can be done about left knee so she can exercise more. She has lost 6 pounds since July 2015. Trying to eat better. Husband is a diabetic but he doesn't follow a strict diabetic diet. However she has had education in diabetic diet for him. Says sometimes she goes for while and doesn't eat and subsequently becomes shaky. May have  reactive hypoglycemia. I have written a prescription for her to have a home glucose monitor and diabetic test strips to start watching her glucose twice daily. We are also going to check hemoglobin A1c. Last time it was checked it was 6.0%. Last time liver functions were checked here SGOT and SGPT were in the 60s. She also has a history of hypothyroidism and TSH was checked today along with lipid panel.    Review of Systems see above: regarding symptoms when not eating regularly and left knee pain     Objective:   Physical Exam Neck is supple without JVD thyromegaly or carotid bruits. Chest clear to auscultation. Cardiac exam regular rate and rhythm normal S1-S2. Extremities without edema. She is alert and oriented. Skin is warm and dry.       Assessment & Plan:  Obesity-needs to diet exercise and lose  weight  Left knee pain-try 2 Aleve twice daily. See orthopedist  Elevated liver functions-thought to be due to fatty liver  Fatty liver infiltration related to obesity  History of gallstones-asymptomatic  History of hypothyroidism-TSH checked today on thyroid replacement  Hyperlipidemia-statin intolerant. Fasting lipid panel drawn today  Glucose intolerance-hemoglobin A1c drawn today. Prescription for home glucose monitoring test strips  Plan: Schedule physical examination July 2016  25 minutes spent with patient in coordination of care and counseling

## 2014-06-30 NOTE — Patient Instructions (Signed)
Please diet exercise and lose weight. Return in 6 months. Have colonoscopy in the near future. Hemoglobin A1c drawn. Purchase home Accu-Chek machine and test glucose twice daily. See orthopedist regarding left knee pain. Thyroid functions checked. Liver panel checked at patient's request and will be forwarded to Dr. Collene Mares

## 2014-07-01 ENCOUNTER — Telehealth: Payer: Self-pay | Admitting: *Deleted

## 2014-07-01 NOTE — Telephone Encounter (Signed)
Patient returned call. Reviewed lab results with patient copies faxed to Dr Collene Mares

## 2014-07-01 NOTE — Telephone Encounter (Signed)
Left message for patient to call back  

## 2014-07-06 ENCOUNTER — Telehealth: Payer: Self-pay | Admitting: *Deleted

## 2014-07-06 NOTE — Telephone Encounter (Signed)
Went on [employer] site entered referral information it came up that patient does not need a referral for this type of insurance. [employer] policy is an active policy according to web site. Notified patient

## 2014-07-06 NOTE — Telephone Encounter (Signed)
Patient called states she needs a referral for [facility] for her colonoscopy scheduled for 07/11/2014.

## 2014-07-24 ENCOUNTER — Other Ambulatory Visit: Payer: Self-pay | Admitting: Internal Medicine

## 2014-08-15 ENCOUNTER — Encounter: Payer: Self-pay | Admitting: Internal Medicine

## 2014-09-18 ENCOUNTER — Other Ambulatory Visit: Payer: Self-pay | Admitting: Internal Medicine

## 2014-12-18 ENCOUNTER — Other Ambulatory Visit: Payer: Self-pay | Admitting: Internal Medicine

## 2014-12-30 ENCOUNTER — Other Ambulatory Visit: Payer: Commercial Managed Care - HMO | Admitting: Internal Medicine

## 2014-12-30 ENCOUNTER — Encounter: Payer: Self-pay | Admitting: Internal Medicine

## 2014-12-30 ENCOUNTER — Ambulatory Visit (INDEPENDENT_AMBULATORY_CARE_PROVIDER_SITE_OTHER): Payer: Commercial Managed Care - HMO | Admitting: Internal Medicine

## 2014-12-30 VITALS — BP 110/82 | HR 76 | Temp 98.0°F | Ht 60.0 in | Wt 199.0 lb

## 2014-12-30 DIAGNOSIS — R748 Abnormal levels of other serum enzymes: Secondary | ICD-10-CM | POA: Diagnosis not present

## 2014-12-30 DIAGNOSIS — J4520 Mild intermittent asthma, uncomplicated: Secondary | ICD-10-CM

## 2014-12-30 DIAGNOSIS — E039 Hypothyroidism, unspecified: Secondary | ICD-10-CM | POA: Diagnosis not present

## 2014-12-30 DIAGNOSIS — M19041 Primary osteoarthritis, right hand: Secondary | ICD-10-CM

## 2014-12-30 DIAGNOSIS — K219 Gastro-esophageal reflux disease without esophagitis: Secondary | ICD-10-CM

## 2014-12-30 DIAGNOSIS — J309 Allergic rhinitis, unspecified: Secondary | ICD-10-CM

## 2014-12-30 DIAGNOSIS — Z0000 Encounter for general adult medical examination without abnormal findings: Secondary | ICD-10-CM

## 2014-12-30 DIAGNOSIS — M19042 Primary osteoarthritis, left hand: Secondary | ICD-10-CM | POA: Diagnosis not present

## 2014-12-30 DIAGNOSIS — Z130 Encounter for screening for diseases of the blood and blood-forming organs and certain disorders involving the immune mechanism: Secondary | ICD-10-CM

## 2014-12-30 DIAGNOSIS — R7309 Other abnormal glucose: Secondary | ICD-10-CM

## 2014-12-30 DIAGNOSIS — Z13220 Encounter for screening for lipoid disorders: Secondary | ICD-10-CM

## 2014-12-30 DIAGNOSIS — E785 Hyperlipidemia, unspecified: Secondary | ICD-10-CM

## 2014-12-30 DIAGNOSIS — F411 Generalized anxiety disorder: Secondary | ICD-10-CM | POA: Diagnosis not present

## 2014-12-30 DIAGNOSIS — K760 Fatty (change of) liver, not elsewhere classified: Secondary | ICD-10-CM | POA: Insufficient documentation

## 2014-12-30 DIAGNOSIS — E669 Obesity, unspecified: Secondary | ICD-10-CM

## 2014-12-30 DIAGNOSIS — Z1321 Encounter for screening for nutritional disorder: Secondary | ICD-10-CM

## 2014-12-30 DIAGNOSIS — Z1329 Encounter for screening for other suspected endocrine disorder: Secondary | ICD-10-CM

## 2014-12-30 DIAGNOSIS — R7303 Prediabetes: Secondary | ICD-10-CM

## 2014-12-30 LAB — COMPLETE METABOLIC PANEL WITH GFR
ALK PHOS: 70 U/L (ref 33–130)
ALT: 52 U/L — ABNORMAL HIGH (ref 6–29)
AST: 58 U/L — AB (ref 10–35)
Albumin: 4.1 g/dL (ref 3.6–5.1)
BILIRUBIN TOTAL: 0.5 mg/dL (ref 0.2–1.2)
BUN: 14 mg/dL (ref 7–25)
CHLORIDE: 103 mmol/L (ref 98–110)
CO2: 24 mmol/L (ref 20–31)
Calcium: 9.3 mg/dL (ref 8.6–10.4)
Creat: 0.79 mg/dL (ref 0.50–0.99)
GFR, Est African American: >89 mL/min (ref >=60)
GFR, Est Non African American: 79 mL/min (ref >=60)
Glucose, Bld: 88 mg/dL (ref 65–99)
Potassium: 4.2 mmol/L (ref 3.5–5.3)
Sodium: 140 mmol/L (ref 135–146)
Total Protein: 6.8 g/dL (ref 6.1–8.1)

## 2014-12-30 LAB — CBC WITH DIFFERENTIAL/PLATELET
BASOS ABS: 0.1 10*3/uL (ref 0.0–0.1)
Basophils Relative: 1 % (ref 0–1)
Eosinophils Absolute: 0.2 10*3/uL (ref 0.0–0.7)
Eosinophils Relative: 3 % (ref 0–5)
HCT: 36.7 % (ref 36.0–46.0)
Hemoglobin: 12.2 g/dL (ref 12.0–15.0)
LYMPHS PCT: 29 % (ref 12–46)
Lymphs Abs: 1.6 10*3/uL (ref 0.7–4.0)
MCH: 30.7 pg (ref 26.0–34.0)
MCHC: 33.2 g/dL (ref 30.0–36.0)
MCV: 92.4 fL (ref 78.0–100.0)
MONOS PCT: 11 % (ref 3–12)
MPV: 11 fL (ref 8.6–12.4)
Monocytes Absolute: 0.6 10*3/uL (ref 0.1–1.0)
NEUTROS ABS: 3.1 10*3/uL (ref 1.7–7.7)
NEUTROS PCT: 56 % (ref 43–77)
Platelets: 230 10*3/uL (ref 150–400)
RBC: 3.97 MIL/uL (ref 3.87–5.11)
RDW: 14.1 % (ref 11.5–15.5)
WBC: 5.6 10*3/uL (ref 4.0–10.5)

## 2014-12-30 LAB — POCT URINALYSIS DIPSTICK
Bilirubin, UA: NEGATIVE
Blood, UA: NEGATIVE
Glucose, UA: NEGATIVE
Ketones, UA: NEGATIVE
Leukocytes, UA: NEGATIVE
Nitrite, UA: NEGATIVE
PH UA: 5
Protein, UA: NEGATIVE
Spec Grav, UA: 1.02
Urobilinogen, UA: NEGATIVE

## 2014-12-30 LAB — LIPID PANEL
Cholesterol: 232 mg/dL — ABNORMAL HIGH (ref 125–200)
HDL: 45 mg/dL — AB (ref >=46)
LDL Cholesterol: 169 mg/dL — ABNORMAL HIGH (ref <130)
Total CHOL/HDL Ratio: 5.2 Ratio — ABNORMAL HIGH (ref <=5.0)
Triglycerides: 91 mg/dL (ref <150)
VLDL: 18 mg/dL (ref <30)

## 2014-12-30 LAB — HEMOGLOBIN A1C
Hgb A1c MFr Bld: 5.9 % — ABNORMAL HIGH (ref <5.7)
MEAN PLASMA GLUCOSE: 123 mg/dL — AB (ref <117)

## 2014-12-30 LAB — TSH: TSH: 2.29 u[IU]/mL (ref 0.350–4.500)

## 2014-12-30 MED ORDER — LEVOTHYROXINE SODIUM 75 MCG PO TABS: 75.0000 ug | ORAL_TABLET | Freq: Every day | ORAL | Status: DC

## 2014-12-30 NOTE — Progress Notes (Signed)
Subjective:    Patient ID: Olivia Bender, female    DOB: [birth year], 64 y.o.   MRN: [id number]  HPI 64 year old White Female for health maintenance and evaluation of medical problems. History of asthma treated by Dr. Harold Hedge at [facility]. History of hyperlipidemia. She is statin intolerant. History of hypothyroidism and obesity. Has been going to [employer]. Has lost 12 pounds in about 3 or 4 weeks. History of allergic rhinitis, allergic bronchitis, osteopenia, GE reflux. History of anxiety and situational stress.  History of mildly elevated liver functions due to fatty liver infiltration.  Patient had allergy evaluation May 2012. Skin testing did not reveal any significant antigenicity. She was changed from Advair to Alvesco inhaler and did better. She is a steroid nasal spray and Patanol eyedrops. She tends to get bronchitis easily.  History of hand osteoarthritis. History of frequent headaches. Bunion left foot. Diagnosed with hypothyroidism in 2010. Had colonoscopy by Dr. Collene Mares in 2006. Took Fosamax for osteopenia for 4 years and stopped in 2010.  GYN is Dr. Evlyn Kanner  She is allergic to latex. She says Levaquin makes her constipated. Is allergic to tetanus toxoid.  Family history: Mother died from complications of stroke and rheumatoid arthritis. Both mother and sister have history of headaches. Mother had hypertension. She also had an enlarged heart.  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 Completed high school. She is married. Husband has many medical problems including heart disease and back pain. She has 2 adult sons. Does not smoke or consume alcohol. One son is a Mudlogger.   Additional history: Patient had sinus surgery in [place] in 1986. Bunion of left foot. Left fractured foot 1985. History of left arm fracture. Hospitalized for chest pain with MI being ruled out February 2007.  Had negative Cardiolite study February 2007.                Review of Systems  Constitutional: Negative.   Respiratory: Negative.   Cardiovascular: Negative.   Gastrointestinal:       History of GE reflux  Genitourinary:       Recent UTI treated by Dr. Deatra Ina. Urinalysis today is normal  Neurological: Negative.   Hematological: Negative.   Psychiatric/Behavioral:       Anxiety       Objective:   Physical Exam  Constitutional: She is oriented to person, place, and time. She appears well-developed and well-nourished. No distress.  HENT:  Head: Normocephalic and atraumatic.  Right Ear: External ear normal.  Left Ear: External ear normal.  Mouth/Throat: Oropharynx is clear and moist. No oropharyngeal exudate.  Eyes: Conjunctivae and EOM are normal. Pupils are equal, round, and reactive to light. Right eye exhibits no discharge. Left eye exhibits no discharge. No scleral icterus.  Neck: Neck supple. No JVD present. No thyromegaly present.  Cardiovascular: Normal rate, regular rhythm, normal heart sounds and intact distal pulses.   No murmur heard. Pulmonary/Chest: Effort normal and breath sounds normal. No respiratory distress. She has no wheezes. She has no rales.  Abdominal: Soft. Bowel sounds are normal. She exhibits no distension and no mass. There is no tenderness. There is no rebound and no guarding.  Genitourinary:  Deferred to Dr. Deatra Ina  Musculoskeletal: She exhibits no edema.  Neurological: She is alert and oriented to person, place, and time. She has normal reflexes. No cranial nerve deficit. Coordination normal.  Skin: Skin is  warm and dry. No rash noted. She is not diaphoretic.  Psychiatric: She has a normal mood and affect. Her behavior is normal. Judgment and thought content normal.  Vitals reviewed.         Assessment & Plan:  Obesity-now going to [employer] with some success but needs to follow through with that. Did not go this past  week.  GE reflux-maintain on PPI  Hyperlipidemia-statin intolerant  Osteopenia  Anxiety  Situational stress  Allergic rhinitis and bronchitis  Fatty liver infiltration causing elevation of liver functions  Hypothyroidism  Fasting labs drawn today and are pending. She will need to receive Prevnar vaccine in the near future. She received pneumococcal 23 vaccine 07/14/2005. She cannot receive Tdap because of severe local reaction. Gets annual flu vaccine. Return in 6 months. TSH is within normal limits. Refill thyroid replacement at same dose for 6 months.

## 2014-12-30 NOTE — Patient Instructions (Signed)
Continue same medications. Continue Weight Watchers and weight loss efforts. Return in 6 months.

## 2014-12-31 LAB — MICROALBUMIN / CREATININE URINE RATIO
CREATININE, URINE: 178.5 mg/dL
MICROALB UR: 1 mg/dL (ref <2.0)
Microalb Creat Ratio: 5.6 mg/g (ref 0.0–30.0)

## 2014-12-31 LAB — VITAMIN D 25 HYDROXY (VIT D DEFICIENCY, FRACTURES): Vit D, 25-Hydroxy: 34 ng/mL (ref 30–100)

## 2015-01-02 ENCOUNTER — Telehealth: Payer: Self-pay | Admitting: *Deleted

## 2015-01-02 MED ORDER — EZETIMIBE 10 MG PO TABS: 10.0000 mg | ORAL_TABLET | Freq: Every day | ORAL | Status: DC

## 2015-01-02 NOTE — Telephone Encounter (Signed)
Left message for patient to return call.

## 2015-01-28 ENCOUNTER — Other Ambulatory Visit: Payer: Self-pay | Admitting: Internal Medicine

## 2015-04-11 ENCOUNTER — Other Ambulatory Visit: Payer: Commercial Managed Care - HMO | Admitting: Internal Medicine

## 2015-04-11 ENCOUNTER — Ambulatory Visit (INDEPENDENT_AMBULATORY_CARE_PROVIDER_SITE_OTHER): Payer: Commercial Managed Care - HMO | Admitting: Internal Medicine

## 2015-04-11 ENCOUNTER — Encounter: Payer: Self-pay | Admitting: Internal Medicine

## 2015-04-11 VITALS — BP 122/74 | HR 97 | Temp 98.5°F | Resp 18 | Ht 60.0 in | Wt 192.0 lb

## 2015-04-11 DIAGNOSIS — E785 Hyperlipidemia, unspecified: Secondary | ICD-10-CM

## 2015-04-11 DIAGNOSIS — J209 Acute bronchitis, unspecified: Secondary | ICD-10-CM

## 2015-04-11 DIAGNOSIS — G4762 Sleep related leg cramps: Secondary | ICD-10-CM | POA: Diagnosis not present

## 2015-04-11 DIAGNOSIS — K5900 Constipation, unspecified: Secondary | ICD-10-CM

## 2015-04-11 LAB — LIPID PANEL
CHOL/HDL RATIO: 6.3 ratio — AB (ref <=5.0)
CHOLESTEROL: 240 mg/dL — AB (ref 125–200)
HDL: 38 mg/dL — ABNORMAL LOW (ref >=46)
LDL Cholesterol: 182 mg/dL — ABNORMAL HIGH (ref <130)
Triglycerides: 99 mg/dL (ref <150)
VLDL: 20 mg/dL (ref <30)

## 2015-04-11 MED ORDER — GABAPENTIN 300 MG PO CAPS: ORAL_CAPSULE | ORAL | Status: DC

## 2015-04-11 MED ORDER — EZETIMIBE 10 MG PO TABS: 10.0000 mg | ORAL_TABLET | Freq: Every day | ORAL | Status: DC

## 2015-04-11 MED ORDER — LINACLOTIDE 145 MCG PO CAPS: 145.0000 ug | ORAL_CAPSULE | Freq: Every day | ORAL | Status: DC

## 2015-04-11 MED ORDER — AZITHROMYCIN 250 MG PO TABS: ORAL_TABLET | ORAL | Status: DC

## 2015-04-11 MED ORDER — HYDROCODONE-HOMATROPINE 5-1.5 MG/5ML PO SYRP: 5.0000 mL | ORAL_SOLUTION | Freq: Three times a day (TID) | ORAL | Status: DC | PRN

## 2015-04-11 NOTE — Patient Instructions (Signed)
Try gabapentin for nocturnal leg cramps. Try Linzess for irritable bowel syndrome. Lipid panel pending. Recommendations to follow. Return in 2 weeks for flu vaccine and Prevnar. Takes Zithromax Z-Pak and Hycodan for respiratory infection.

## 2015-04-11 NOTE — Progress Notes (Signed)
   Subjective:    Patient ID: Olivia Bender, female    DOB: 1951-05-26, 64 y.o.   MRN: [id number]  HPI 64 year old Female who was here originally to have follow-up on hyperlipidemia on Zetia. Unfortunately she discontinued Zetia couple weeks ago claiming that it was causing leg cramps and GI distress. Explained to her that these were not common side effects of Zetia. Her lab work was drawn before we knew that she had not been taking Zetia. She says she never heard results of her lipid panel in July. These results were gone over with her today. Explained to her that she could've called if she did not hear back from lab results. She also can sign up for My Chart.  New complaint today are URI symptoms with coughing congestion. Has had some chills but no documented fever. Some discolored sputum.  Also complaining of irritable bowel symptoms with chronic constipation not responding to MiraLAX  Complaining of nocturnal leg cramps. Has been taking magnesium supplement without any relief.  Has been tried on Livalo, Crestor, Zocor and now Zetia.  Review of Systems see above     Objective:   Physical Exam  Skin warm and dry. Nodes none. TMs are slightly full bilaterally. Pharynx is clear without exudate. Neck is supple without adenopathy. Chest is clear without rales or wheezing      Assessment & Plan:  Acute URI-treat with Zithromax Z-PAK. At her request Hycodan refilled for cough.  Hyperlipidemia-not taking Zetia. Intolerant of statin medication as well.  Nocturnal leg cramps-trial of gabapentin  Chronic constipation/irritable bowel symptoms. Trial of Linzess  History of anxiety  Prediabetes  Fatty liver  Elevated liver functions due to fatty liver. Her physical exam was in July or nearly she would be back in January for six-month follow-up.  Health maintenance she is asking about flu vaccine and Prevnar. I want to hold off on these vaccines today since she is ill with a respiratory  infection.  Obesity-needs to be more serious about diet and exercise. Says she is going to Weight [employer]chers. Has lost 13 pounds since January 2016.

## 2015-05-01 ENCOUNTER — Ambulatory Visit: Payer: Commercial Managed Care - HMO | Admitting: Internal Medicine

## 2015-06-28 ENCOUNTER — Telehealth: Payer: Self-pay

## 2015-06-28 ENCOUNTER — Other Ambulatory Visit: Payer: Self-pay

## 2015-06-28 MED ORDER — ACCU-CHEK SMARTVIEW VI STRP: ORAL_STRIP | Status: DC

## 2015-06-28 NOTE — Telephone Encounter (Signed)
Patient contacted office and states that she did not think she needed to be seen tomorrow for her 6 mo f/u on thyroid because you told her in November that you would see her in July for her physical. She states that it was because she wasn't taking her medication and you told her to restart it. I advised patient that we had not checked her thyroid since July and that I would discuss with you. Please advise.

## 2015-06-28 NOTE — Telephone Encounter (Signed)
Patient notified and will come to office for labs only.

## 2015-06-28 NOTE — Telephone Encounter (Signed)
Per Dr. Renold Genta patient is ok to only have TSH drawn with no OV; will see her in July for CPE.

## 2015-06-28 NOTE — Telephone Encounter (Signed)
Left message for return call.

## 2015-06-28 NOTE — Telephone Encounter (Signed)
Refill request received via fax 06/28/15

## 2015-06-28 NOTE — Telephone Encounter (Signed)
Just check TSH tomorrow. NO OV See in July

## 2015-06-29 ENCOUNTER — Other Ambulatory Visit: Payer: Medicare Other | Admitting: Internal Medicine

## 2015-06-29 DIAGNOSIS — E039 Hypothyroidism, unspecified: Secondary | ICD-10-CM | POA: Diagnosis not present

## 2015-06-29 LAB — TSH: TSH: 1.465 u[IU]/mL (ref 0.350–4.500)

## 2015-07-28 ENCOUNTER — Ambulatory Visit (INDEPENDENT_AMBULATORY_CARE_PROVIDER_SITE_OTHER): Payer: Medicare Other | Admitting: Internal Medicine

## 2015-07-28 VITALS — BP 112/76

## 2015-07-28 DIAGNOSIS — Z23 Encounter for immunization: Secondary | ICD-10-CM

## 2015-07-28 MED ORDER — PNEUMOCOCCAL 13-VAL CONJ VACC IM SUSP
0.5000 mL | Freq: Once | INTRAMUSCULAR | Status: AC
  Administered 2015-07-28: 0.5 mL via INTRAMUSCULAR

## 2015-08-03 ENCOUNTER — Telehealth: Payer: Self-pay | Admitting: Internal Medicine

## 2015-08-03 MED ORDER — LEVOTHYROXINE SODIUM 75 MCG PO TABS: 75.0000 ug | ORAL_TABLET | Freq: Every day | ORAL | Status: DC

## 2015-08-03 NOTE — Telephone Encounter (Signed)
Refill x 6 months 

## 2015-08-03 NOTE — Telephone Encounter (Signed)
Wants to get a refill on her thyroid medication (30 day is fine) with refills, sent to a NEW pharmacy.  Medicare will not pay for [facility] any longer.    Update Pharmacy:  [facility] in [place]

## 2015-09-01 ENCOUNTER — Ambulatory Visit (INDEPENDENT_AMBULATORY_CARE_PROVIDER_SITE_OTHER): Payer: Medicare Other | Admitting: Internal Medicine

## 2015-09-01 ENCOUNTER — Encounter: Payer: Self-pay | Admitting: Internal Medicine

## 2015-09-01 VITALS — BP 136/88 | HR 96 | Temp 98.7°F | Resp 20 | Ht 60.0 in | Wt 186.5 lb

## 2015-09-01 DIAGNOSIS — B349 Viral infection, unspecified: Secondary | ICD-10-CM | POA: Diagnosis not present

## 2015-09-01 DIAGNOSIS — J069 Acute upper respiratory infection, unspecified: Secondary | ICD-10-CM | POA: Diagnosis not present

## 2015-09-01 MED ORDER — HYDROCODONE-HOMATROPINE 5-1.5 MG/5ML PO SYRP: 5.0000 mL | ORAL_SOLUTION | Freq: Three times a day (TID) | ORAL | Status: DC | PRN

## 2015-09-01 MED ORDER — AZITHROMYCIN 250 MG PO TABS: ORAL_TABLET | ORAL | Status: DC

## 2015-09-01 NOTE — Patient Instructions (Signed)
Takes Zithromax Z-PAK as directed. Hycodan as needed for cough. Tylenol as needed for headache. Rest and drink plenty of fluids.

## 2015-09-01 NOTE — Progress Notes (Signed)
   Subjective:    Patient ID: Olivia Bender, female    DOB: January 01, 1951, 65 y.o.   MRN: [id number]  HPI Has been visiting friends in 2 different hospitals recently. A few days ago came down with URI symptoms. Has felt like she had a flulike illness with myalgias and headache. Has had cough and congestion. No documented fever. He is almost out of hydrocodone cough syrup. Has been taking Tylenol for myalgias and headache. Last here with respiratory infection November 2016 treated with Zithromax Z-Pak    Review of Systems     Objective:   Physical Exam Skin warm and dry. Sounds nasally congested and hoarse. TMs are clear bilaterally. Pharynx is clear. Neck is supple. Chest clear to auscultation without rales or wheezing. No adenopathy.       Assessment & Plan:  Acute URI  Viral syndrome  Plan: Zithromax Z-Pak take 2 tablets day one followed by 1 tablet days 2 through 5. Refill Hycodan 1 teaspoon by mouth every 8 hours when necessary cough. Continue Tylenol as needed for headache. Rest and drink plenty of fluids.

## 2015-09-13 DIAGNOSIS — Z1289 Encounter for screening for malignant neoplasm of other sites: Secondary | ICD-10-CM | POA: Diagnosis not present

## 2015-11-20 ENCOUNTER — Encounter: Payer: Self-pay | Admitting: Internal Medicine

## 2015-11-20 DIAGNOSIS — Z1231 Encounter for screening mammogram for malignant neoplasm of breast: Secondary | ICD-10-CM | POA: Diagnosis not present

## 2015-11-27 ENCOUNTER — Other Ambulatory Visit: Payer: Self-pay

## 2015-11-27 MED ORDER — SERTRALINE HCL 50 MG PO TABS: ORAL_TABLET | ORAL | Status: DC

## 2015-12-11 ENCOUNTER — Other Ambulatory Visit: Payer: Self-pay | Admitting: Internal Medicine

## 2015-12-11 ENCOUNTER — Other Ambulatory Visit: Payer: Medicare Other | Admitting: Internal Medicine

## 2015-12-11 ENCOUNTER — Encounter: Payer: Self-pay | Admitting: Internal Medicine

## 2015-12-11 ENCOUNTER — Other Ambulatory Visit: Payer: Self-pay

## 2015-12-11 ENCOUNTER — Ambulatory Visit (INDEPENDENT_AMBULATORY_CARE_PROVIDER_SITE_OTHER): Payer: Medicare Other | Admitting: Internal Medicine

## 2015-12-11 VITALS — BP 114/82 | HR 89 | Temp 98.0°F | Resp 16 | Ht 60.0 in | Wt 189.0 lb

## 2015-12-11 DIAGNOSIS — Z79899 Other long term (current) drug therapy: Secondary | ICD-10-CM | POA: Diagnosis not present

## 2015-12-11 DIAGNOSIS — E039 Hypothyroidism, unspecified: Secondary | ICD-10-CM | POA: Diagnosis not present

## 2015-12-11 DIAGNOSIS — Z658 Other specified problems related to psychosocial circumstances: Secondary | ICD-10-CM | POA: Diagnosis not present

## 2015-12-11 DIAGNOSIS — M7062 Trochanteric bursitis, left hip: Secondary | ICD-10-CM

## 2015-12-11 DIAGNOSIS — E559 Vitamin D deficiency, unspecified: Secondary | ICD-10-CM

## 2015-12-11 DIAGNOSIS — E785 Hyperlipidemia, unspecified: Secondary | ICD-10-CM

## 2015-12-11 DIAGNOSIS — F411 Generalized anxiety disorder: Secondary | ICD-10-CM

## 2015-12-11 DIAGNOSIS — Z0000 Encounter for general adult medical examination without abnormal findings: Secondary | ICD-10-CM | POA: Diagnosis not present

## 2015-12-11 DIAGNOSIS — J309 Allergic rhinitis, unspecified: Secondary | ICD-10-CM | POA: Diagnosis not present

## 2015-12-11 DIAGNOSIS — F439 Reaction to severe stress, unspecified: Secondary | ICD-10-CM

## 2015-12-11 DIAGNOSIS — R739 Hyperglycemia, unspecified: Secondary | ICD-10-CM | POA: Diagnosis not present

## 2015-12-11 LAB — CBC WITH DIFFERENTIAL/PLATELET
BASOS ABS: 0 {cells}/uL (ref 0–200)
Basophils Relative: 0 %
EOS ABS: 165 {cells}/uL (ref 15–500)
Eosinophils Relative: 3 %
HEMATOCRIT: 35.3 % (ref 35.0–45.0)
Hemoglobin: 11.4 g/dL — ABNORMAL LOW (ref 11.7–15.5)
Lymphocytes Relative: 33 %
Lymphs Abs: 1815 cells/uL (ref 850–3900)
MCH: 29.8 pg (ref 27.0–33.0)
MCHC: 32.3 g/dL (ref 32.0–36.0)
MCV: 92.4 fL (ref 80.0–100.0)
MONOS PCT: 9 %
MPV: 11.3 fL (ref 7.5–12.5)
Monocytes Absolute: 495 cells/uL (ref 200–950)
NEUTROS ABS: 3025 {cells}/uL (ref 1500–7800)
Neutrophils Relative %: 55 %
PLATELETS: 261 10*3/uL (ref 140–400)
RBC: 3.82 MIL/uL (ref 3.80–5.10)
RDW: 14.4 % (ref 11.0–15.0)
WBC: 5.5 10*3/uL (ref 3.8–10.8)

## 2015-12-11 LAB — COMPLETE METABOLIC PANEL WITH GFR
ALBUMIN: 4.2 g/dL (ref 3.6–5.1)
ALK PHOS: 65 U/L (ref 33–130)
ALT: 16 U/L (ref 6–29)
AST: 26 U/L (ref 10–35)
BILIRUBIN TOTAL: 0.4 mg/dL (ref 0.2–1.2)
BUN: 14 mg/dL (ref 7–25)
CALCIUM: 8.9 mg/dL (ref 8.6–10.4)
CO2: 24 mmol/L (ref 20–31)
Chloride: 105 mmol/L (ref 98–110)
Creat: 0.82 mg/dL (ref 0.50–0.99)
GFR, EST AFRICAN AMERICAN: 87 mL/min (ref >=60)
GFR, EST NON AFRICAN AMERICAN: 75 mL/min (ref >=60)
Glucose, Bld: 83 mg/dL (ref 65–99)
Potassium: 4.4 mmol/L (ref 3.5–5.3)
Sodium: 139 mmol/L (ref 135–146)
TOTAL PROTEIN: 7 g/dL (ref 6.1–8.1)

## 2015-12-11 LAB — LIPID PANEL
CHOLESTEROL: 260 mg/dL — AB (ref 125–200)
HDL: 52 mg/dL (ref >=46)
LDL Cholesterol: 183 mg/dL — ABNORMAL HIGH (ref <130)
Total CHOL/HDL Ratio: 5 Ratio (ref <=5.0)
Triglycerides: 127 mg/dL (ref <150)
VLDL: 25 mg/dL (ref <30)

## 2015-12-11 LAB — HEMOGLOBIN A1C
HEMOGLOBIN A1C: 5.6 % (ref <5.7)
MEAN PLASMA GLUCOSE: 114 mg/dL

## 2015-12-11 LAB — TSH: TSH: 2.31 m[IU]/L

## 2015-12-11 NOTE — Patient Instructions (Signed)
Continue same medications and return in 6 months. Continue to work on diet and exercise issues.

## 2015-12-11 NOTE — Progress Notes (Signed)
Subjective:    Patient ID: Olivia Bender, female    DOB: 04-07-1951, 65 y.o.   MRN: [id number]  HPI 65 year old Female in today for Welcome to Medicare physical examination. History of hyperlipidemia and hypothyroidism. History of anxiety. New problem is left hip pain. Has seen Dr. Alvan Dame in the past regarding left knee pain. Suggest patient see Dr. Alvan Dame regarding hip pain.  History of allergic bronchitis and allergic rhinitis treated by Dr. Fredderick Phenix at [facility] allergy. She is statin intolerant despite history of hyperlipidemia. History of hypothyroidism and obesity. History of GE reflux and osteopenia. History of anxiety and situational stress.   Does not tolerate tetanus immunizations because of prior reaction. Has had Prevnar immunization. Will be due for Pneumovax 23 vaccine in one year.  History of hand osteoarthritis. History of frequent headaches. Bunion left foot. Diagnosed with hypothyroidism in 2010.  Had colonoscopy by Dr. Collene Mares in 2006.  Took Fosamax for osteopenia for 4 years and stopped in 2010.  GYN physician is Dr. Evlyn Kanner. She has had recent mammogram.  She is allergic to latex. She says Levaquin makes her constipated. Has had significant local reaction to tetanus toxoid.  Family history: Mother died from complications of stroke and rheumatoid arthritis. Mother and sister with history of headaches. Mother had hypertension. Mother also had an enlarged heart. Father died in a motor vehicle asked about had history of prostate cancer.  Social history: She is married. Husband has many medical problems. He needs back surgery but refuses it at the present time. 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 Yolanda Bonine lives with her but he is getting married soon and moving about 3 miles down the road. 2 adult sons. Does not smoke or consume alcohol. One son is a Mudlogger.  Additional history: Patient had sinus surgery in  [place] in 1986. Fractured left foot 1985. History of left arm fracture.  Hospitalized for chest pain with MI being ruled out February 2007. Had negative Cardiolite study February 2007    Review of Systems  HENT: Negative.   Cardiovascular: Negative.   Genitourinary: Negative.   Musculoskeletal:       Left hip pain  Skin: Negative.   Neurological: Negative.        Objective:   Physical Exam  Constitutional: She is oriented to person, place, and time. She appears well-developed and well-nourished. No distress.  HENT:  Head: Normocephalic and atraumatic.  Right Ear: External ear normal.  Left Ear: External ear normal.  Mouth/Throat: Oropharynx is clear and moist.  Eyes: Conjunctivae and EOM are normal. Pupils are equal, round, and reactive to light. Right eye exhibits no discharge. Left eye exhibits no discharge.  Neck: Neck supple. No JVD present. No thyromegaly present.  Cardiovascular: Normal rate, normal heart sounds and intact distal pulses.   No murmur heard. Pulmonary/Chest: Effort normal and breath sounds normal. No respiratory distress. She has no wheezes.  Breasts normal female without masses  Abdominal: Soft. Bowel sounds are normal. She exhibits no distension and no mass. There is no tenderness. There is no rebound and no guarding.  Genitourinary:  Deferred to GYN  Musculoskeletal: She exhibits no edema.  She has palpable tenderness over left greater trochanter. Straight leg raising is negative at 90. No significant pain with internal or external rotation of left ear  Lymphadenopathy:    She has no cervical adenopathy.  Neurological: She is alert and oriented to person,  place, and time. No cranial nerve deficit. Coordination normal.  Skin: Skin is warm and dry. No rash noted. She is not diaphoretic.  Psychiatric: She has a normal mood and affect. Her behavior is normal. Judgment and thought content normal.  Vitals reviewed.         Assessment & Plan:    Hyperlipidemia-fasting lab work pending. History of statin intolerance  Left hip pain-suspect trochanteric bursitis-to see orthopedist  Hypothyroidism-TSH drawn and pending  Obesity-continue diet and exercise efforts  History of anxiety  History of allergic rhinitis and allergic bronchitis treated by allergist  History of anxiety and situational stress    Plan: Return in 6 months for office visit, TSH, lipid panel. To see orthopedist regarding left hip pain.  Subjective:   Patient presents for Medicare Annual/Subsequent preventive examination.  Review Past Medical/Family/Social:See above   Risk Factors  Current exercise habits: Mostly sedentary due to hip pain Dietary issues discussed: Low fat low carbohydrate  Cardiac risk factors: Hyperlipidemia, history of stroke in mother  Depression Screen  (Note: if answer to either of the following is "Yes", a more complete depression screening is indicated)   Over the past two weeks, have you felt down, depressed or hopeless? No  Over the past two weeks, have you felt little interest or pleasure in doing things? No Have you lost interest or pleasure in daily life? No Do you often feel hopeless? No Do you cry easily over simple problems? No   Activities of Daily Living  In your present state of health, do you have any difficulty performing the following activities?:   Driving? No  Managing money? No  Feeding yourself? No  Getting from bed to chair? No  Climbing a flight of stairs? No  Preparing food and eating?: No  Bathing or showering? No  Getting dressed: No  Getting to the toilet? No  Using the toilet:No  Moving around from place to place: No  In the past year have you fallen or had a near fall?:No  Are you sexually active? No  Do you have more than one partner? No   Hearing Difficulties: No  Do you often ask people to speak up or repeat themselves? No  Do you experience ringing or noises in your ears?Yes  Do  you have difficulty understanding soft or whispered voices? Yes Do you feel that you have a problem with memory? No Do you often misplace items? No    Home Safety:  Do you have a smoke alarm at your residence? Yes Do you have grab bars in the bathroom?No Do you have throw rugs in your house? Yes   Cognitive Testing  Alert? Yes Normal Appearance?Yes  Oriented to person? Yes Place? Yes  Time? Yes  Recall of three objects? Yes  Can perform simple calculations? Yes  Displays appropriate judgment?Yes  Can read the correct time from a watch face?Yes   List the Names of Other Physician/Practitioners you currently use:  See referral list for the physicians patient is currently seeing.  Dr. Alvan Dame- orthopedist  Dr. Ailene Rud   Review of Systems: See above   Objective:     General appearance: Appears stated age and mildly obese  Head: Normocephalic, without obvious abnormality, atraumatic  Eyes: conj clear, EOMi PEERLA  Ears: normal TM's and external ear canals both ears  Nose: Nares normal. Septum midline. Mucosa normal. No drainage or sinus tenderness.  Throat: lips, mucosa, and tongue normal; teeth and gums normal  Neck: no adenopathy, no carotid  bruit, no JVD, supple, symmetrical, trachea midline and thyroid not enlarged, symmetric, no tenderness/mass/nodules  No CVA tenderness.  Lungs: clear to auscultation bilaterally  Breasts: normal appearance, no masses or tenderness Heart: regular rate and rhythm, S1, S2 normal, no murmur, click, rub or gallop  Abdomen: soft, non-tender; bowel sounds normal; no masses, no organomegaly  Musculoskeletal: ROM normal in all joints, no crepitus, no deformity, Normal muscle strengthen. Back  is symmetric, no curvature. Skin: Skin color, texture, turgor normal. No rashes or lesions  Lymph nodes: Cervical, supraclavicular, and axillary nodes normal.  Neurologic: CN 2 -12 Normal, Normal symmetric reflexes. Normal coordination and gait  Psych:  Alert & Oriented x 3, Mood appear stable.    Assessment:    Annual wellness medicare exam   Plan:    During the course of the visit the patient was educated and counseled about appropriate screening and preventive services including:  Has had annual mammogram  Sees Dr. Ailene Rud physician  Has had Prevnar  Does not take tetanus immunization due to reaction in the past  Recommend annual flu vaccine      Patient Instructions (the written plan) was given to the patient.  Medicare Attestation  I have personally reviewed:  The patient's medical and social history  Their use of alcohol, tobacco or illicit drugs  Their current medications and supplements  The patient's functional ability including ADLs,fall risks, home safety risks, cognitive, and hearing and visual impairment  Diet and physical activities  Evidence for depression or mood disorders  The patient's weight, height, BMI, and visual acuity have been recorded in the chart. I have made referrals, counseling, and provided education to the patient based on review of the above and I have provided the patient with a written personalized care plan for preventive services.

## 2015-12-12 LAB — VITAMIN D 25 HYDROXY (VIT D DEFICIENCY, FRACTURES): VIT D 25 HYDROXY: 31 ng/mL (ref 30–100)

## 2015-12-13 ENCOUNTER — Telehealth: Payer: Self-pay

## 2015-12-13 MED ORDER — EZETIMIBE 10 MG PO TABS: 10.0000 mg | ORAL_TABLET | Freq: Every day | ORAL | Status: DC

## 2015-12-13 NOTE — Telephone Encounter (Signed)
There is nothing else to try except new injectable meds which are VERY expensive and have not been on market very long. I would like for her to try Zetia once again as it is NOT supposed to cause leg pain

## 2015-12-13 NOTE — Telephone Encounter (Signed)
-----   Message from Elby Showers, MD sent at 12/12/2015  9:38 AM EDT ----- Has pt ever tried Zetia for elevated cholesterol? Can she check and see if it is covered with her plan?

## 2015-12-13 NOTE — Telephone Encounter (Signed)
Patient in agreement to try zetia again. I will send this to [facility] for her.

## 2015-12-13 NOTE — Telephone Encounter (Signed)
I spoke to patient about her labs. She states that she has taken zetia in the past and it caused leg pain. She states that she is willing to try something different.

## 2015-12-18 ENCOUNTER — Other Ambulatory Visit: Payer: Self-pay | Admitting: Internal Medicine

## 2015-12-18 ENCOUNTER — Encounter: Payer: Self-pay | Admitting: Internal Medicine

## 2015-12-19 DIAGNOSIS — M25562 Pain in left knee: Secondary | ICD-10-CM | POA: Diagnosis not present

## 2015-12-19 DIAGNOSIS — M2392 Unspecified internal derangement of left knee: Secondary | ICD-10-CM | POA: Diagnosis not present

## 2016-01-03 ENCOUNTER — Other Ambulatory Visit: Payer: Self-pay

## 2016-01-03 MED ORDER — ESOMEPRAZOLE MAGNESIUM 40 MG PO CPDR: 40.0000 mg | DELAYED_RELEASE_CAPSULE | Freq: Every day | ORAL | 10 refills | Status: DC

## 2016-01-28 ENCOUNTER — Other Ambulatory Visit: Payer: Self-pay | Admitting: Internal Medicine

## 2016-04-18 DIAGNOSIS — J4530 Mild persistent asthma, uncomplicated: Secondary | ICD-10-CM | POA: Diagnosis not present

## 2016-04-18 DIAGNOSIS — H1045 Other chronic allergic conjunctivitis: Secondary | ICD-10-CM | POA: Diagnosis not present

## 2016-04-18 DIAGNOSIS — K219 Gastro-esophageal reflux disease without esophagitis: Secondary | ICD-10-CM | POA: Diagnosis not present

## 2016-04-18 DIAGNOSIS — J310 Chronic rhinitis: Secondary | ICD-10-CM | POA: Diagnosis not present

## 2016-05-29 ENCOUNTER — Telehealth: Payer: Self-pay | Admitting: Internal Medicine

## 2016-05-29 ENCOUNTER — Encounter: Payer: Self-pay | Admitting: Internal Medicine

## 2016-05-29 MED ORDER — SERTRALINE HCL 50 MG PO TABS: ORAL_TABLET | ORAL | 3 refills | Status: DC

## 2016-05-29 NOTE — Telephone Encounter (Signed)
Requests from [facility] to refill generic Zoloft. Electronically prescribe refill for one year. #90 with 3 refills

## 2016-06-10 ENCOUNTER — Ambulatory Visit (INDEPENDENT_AMBULATORY_CARE_PROVIDER_SITE_OTHER): Payer: Medicare Other | Admitting: Internal Medicine

## 2016-06-10 ENCOUNTER — Other Ambulatory Visit: Payer: Medicare Other | Admitting: Internal Medicine

## 2016-06-10 ENCOUNTER — Encounter: Payer: Self-pay | Admitting: Internal Medicine

## 2016-06-10 VITALS — BP 142/82 | HR 97 | Temp 97.6°F | Wt 199.0 lb

## 2016-06-10 DIAGNOSIS — F439 Reaction to severe stress, unspecified: Secondary | ICD-10-CM

## 2016-06-10 DIAGNOSIS — E039 Hypothyroidism, unspecified: Secondary | ICD-10-CM

## 2016-06-10 DIAGNOSIS — M19041 Primary osteoarthritis, right hand: Secondary | ICD-10-CM | POA: Diagnosis not present

## 2016-06-10 DIAGNOSIS — Z789 Other specified health status: Secondary | ICD-10-CM

## 2016-06-10 DIAGNOSIS — M19042 Primary osteoarthritis, left hand: Secondary | ICD-10-CM | POA: Diagnosis not present

## 2016-06-10 DIAGNOSIS — M5432 Sciatica, left side: Secondary | ICD-10-CM | POA: Diagnosis not present

## 2016-06-10 DIAGNOSIS — F411 Generalized anxiety disorder: Secondary | ICD-10-CM | POA: Diagnosis not present

## 2016-06-10 DIAGNOSIS — E785 Hyperlipidemia, unspecified: Secondary | ICD-10-CM | POA: Diagnosis not present

## 2016-06-10 DIAGNOSIS — M7062 Trochanteric bursitis, left hip: Secondary | ICD-10-CM

## 2016-06-10 DIAGNOSIS — G8929 Other chronic pain: Secondary | ICD-10-CM | POA: Diagnosis not present

## 2016-06-10 DIAGNOSIS — R7303 Prediabetes: Secondary | ICD-10-CM | POA: Diagnosis not present

## 2016-06-10 DIAGNOSIS — M25562 Pain in left knee: Secondary | ICD-10-CM

## 2016-06-10 DIAGNOSIS — E7800 Pure hypercholesterolemia, unspecified: Secondary | ICD-10-CM | POA: Diagnosis not present

## 2016-06-10 DIAGNOSIS — K219 Gastro-esophageal reflux disease without esophagitis: Secondary | ICD-10-CM | POA: Diagnosis not present

## 2016-06-10 LAB — LIPID PANEL
CHOL/HDL RATIO: 5.2 ratio — AB (ref <5.0)
CHOLESTEROL: 235 mg/dL — AB (ref <200)
HDL: 45 mg/dL — ABNORMAL LOW (ref >50)
LDL Cholesterol: 167 mg/dL — ABNORMAL HIGH (ref <100)
Triglycerides: 113 mg/dL (ref <150)
VLDL: 23 mg/dL (ref <30)

## 2016-06-10 LAB — HEMOGLOBIN A1C
HEMOGLOBIN A1C: 5.4 % (ref <5.7)
MEAN PLASMA GLUCOSE: 108 mg/dL

## 2016-06-10 LAB — TSH: TSH: 2.98 mIU/L

## 2016-06-10 MED ORDER — PREDNISONE 10 MG PO TABS: ORAL_TABLET | ORAL | 0 refills | Status: DC

## 2016-06-10 NOTE — Addendum Note (Signed)
Addended by: Amado Coe on: 06/10/2016 11:23 AM   Modules accepted: Orders

## 2016-06-10 NOTE — Patient Instructions (Addendum)
Pt to go to physical therapy for 2-3 weeks for sciaitica. Try sterapred DS for 12 days. Then start meloxicam, RTC in 2 weeks. Labs drawn and pending with further instructions to follow.

## 2016-06-10 NOTE — Progress Notes (Signed)
   Subjective:    Patient ID: Olivia Bender, female    DOB: 1951/03/10, 66 y.o.   MRN: [id number]  HPI  66 year old for 6 month recheck. Not taking Zetia. Thought it caused leg pain. Seen at [facility] in the summer. Saw a PA with Dr. Alvan Dame had injection in hip. Also tried on Mobic but still with pain. Was thought initially to have trochanteric bursitis but now goes alll the way down the left lateral leg. Feels that hip will give out. Was told she had a spur on her left knee as well. Says her left knee hurts. She also has bilateral hand arthritis. Mobic helps some.  Fasting lipid panel  drawn today but she is not taking any lipid-lowering medication. TSH drawn. Hemoglobin A1c drawn for history of impaired glucose tolerance. In July hemoglobin A1c was 5.6% and previously had been 5.9%. In July total cholesterol was 260 and had been 240. LDL cholesterol was 183 and had been 182 year previously.    Review of Systems complaining bitterly of left buttock hip and leg pain     Objective:   Physical Exam Skin warm and dry. Nodes none. No thyromegaly. Chest clear. Cardiac exam regular rate and rhythm normal S1 and S2. Extremities without pitting edema. Slightly positive straight leg raising on the left. No lower extremity weakness appreciated. Tender over left trochanter.       Assessment & Plan:  I feel she may have a combination of issues going on with left lower extremity. Do think she may have an element of trochanteric bursitis. I do need to get the note from Dr. Alvan Dame. I do not have that in Epic. It seems plausible she could have some sciatica. I have given her a Sterapred DS 10 mg 12 day dosepak. She'll go to physical therapy at [facility] in May a day and 2-3 times a week for 2-3 weeks now see her again in a couple of weeks for follow-up. In the meantime I'll get the note from Dr. Alvan Dame. Once she finishes the steroid taper she is to start motor vehicle 15 mg once daily. Labs are  pending.

## 2016-06-12 ENCOUNTER — Telehealth: Payer: Self-pay | Admitting: Internal Medicine

## 2016-06-12 NOTE — Telephone Encounter (Signed)
Patient called to let us know she has to cancel her physical therapy appointments for a bit until her husband recovers from open heart surgery.  She is taking the prednisone and it is helping.  She has also cancelled her follow up appointment and will reschedule as she can.

## 2016-06-13 ENCOUNTER — Ambulatory Visit: Payer: Self-pay | Admitting: Physical Therapy

## 2016-06-25 ENCOUNTER — Ambulatory Visit: Payer: Medicare Other | Admitting: Internal Medicine

## 2016-07-31 ENCOUNTER — Other Ambulatory Visit: Payer: Self-pay | Admitting: Internal Medicine

## 2016-07-31 ENCOUNTER — Ambulatory Visit: Payer: Medicare Other | Attending: Internal Medicine | Admitting: Physical Therapy

## 2016-07-31 ENCOUNTER — Encounter: Payer: Self-pay | Admitting: Physical Therapy

## 2016-07-31 DIAGNOSIS — M5440 Lumbago with sciatica, unspecified side: Secondary | ICD-10-CM | POA: Diagnosis not present

## 2016-07-31 DIAGNOSIS — M6281 Muscle weakness (generalized): Secondary | ICD-10-CM | POA: Diagnosis not present

## 2016-07-31 DIAGNOSIS — R262 Difficulty in walking, not elsewhere classified: Secondary | ICD-10-CM

## 2016-07-31 DIAGNOSIS — G8929 Other chronic pain: Secondary | ICD-10-CM

## 2016-07-31 NOTE — Patient Instructions (Signed)
   You can use a towel to pull bottom leg off the ground until a stretch is felt.  Hold 30 seconds 3 times Twice a day    Lift bottom off of bed or table, tightening up your abdominals before lifting. Hold 3-5 seconds Repeat 10 times Twice a day

## 2016-07-31 NOTE — Therapy (Addendum)
[facility] [place], [place], [address] Phone: [phone]   Fax:  [phone]  Physical Therapy Evaluation  Patient Details  Name: Olivia Bender MRN: [id number] Date of Birth: [birth year] Referring Provider: Emeline General, MD  Encounter Date: 07/31/2016      PT End of Session - 08/05/16 0857    Visit Number 2   Number of Visits 12   PT Start Time 0900   PT Stop Time 0959   PT Time Calculation (min) 59 min   Activity Tolerance Patient tolerated treatment well   Behavior During Therapy [facility] for tasks assessed/performed      Past Medical History:  Diagnosis Date  . Allergy   . Anxiety   . Arthritis   . Constipation   . Generalized headaches   . Hyperlipidemia   . Osteopenia   . Thyroid disease    hypothyroidism    Past Surgical History:  Procedure Laterality Date  . NASAL SINUS SURGERY  1986    There were no vitals filed for this visit.       Subjective Assessment - 08/05/16 0858    Subjective Patient reports no pain this morning. She states she had some intermittent pain earlier when trying to pull herself into her truck.   Limitations House hold activities;Standing;Walking   Patient Stated Goals Stop hurting, be able to take care of my husband who just had a CABG surgery   Currently in Pain? No/denies                       [facility] Adult PT Treatment/Exercise - 08/05/16 0001      Exercises   Exercises Lumbar;Knee/Hip     Lumbar Exercises: Supine   Ab Set 10 reps;5 seconds  using pelvic floor to engage TA   Bridge 5 seconds;Non-compliant;20 reps     Knee/Hip Exercises: Stretches   Active Hamstring Stretch Left;5 reps;10 seconds   ITB Stretch Left;1 rep;60 seconds  using pillow under waist for QL stretch   Piriformis Stretch 2 reps;30 seconds  B     Knee/Hip Exercises: Aerobic   Nustep L5 x 10 min  VCs to tighten up abdominals     Modalities   Modalities Electrical  Stimulation;Moist Heat     Moist Heat Therapy   Number Minutes Moist Heat 15 Minutes   Moist Heat Location Lumbar Spine     Electrical Stimulation   Electrical Stimulation Location L lumbar and gluteals/hip   Electrical Stimulation Action IFC   Electrical Stimulation Parameters 80-150Hz  x 15 min   Electrical Stimulation Goals Pain     Manual Therapy   Manual Therapy Myofascial release   Myofascial Release to L ITB                  PT Short Term Goals - 07/31/16 1442      PT SHORT TERM GOAL #1   Title Pt will be independent in her initial HEP   Time 3   Period Weeks   Status New           PT Long Term Goals - 07/31/16 1439      PT LONG TERM GOAL #1   Title Pt will be independent in her HEP and progression   Time 6   Period Weeks   Status New     PT LONG TERM GOAL #2   Title Pt will improve bilateral LE hip strength to 5/5 in order to  improve functional mobility.    Baseline -4 to 4/5   Time 6   Period Weeks   Status New     PT LONG TERM GOAL #3   Title Pt will be able to improve her FOTO score from 49% limitation to </= 42% limitation   Time 6   Period Weeks   Status New     PT LONG TERM GOAL #4   Title Pt will be able to amb up and down a flight of stairs using single hand rail with pain less than/= 2/10.    Time 6   Period Weeks   Status New             Patient will benefit from skilled therapeutic intervention in order to improve the following deficits and impairments:  Postural dysfunction, Decreased mobility, Decreased strength, Pain, Difficulty walking, Decreased activity tolerance, Impaired flexibility  Visit Diagnosis: Chronic left-sided low back pain with sciatica, sciatica laterality unspecified - Plan: PT plan of care cert/re-cert  Difficulty in walking, not elsewhere classified - Plan: PT plan of care cert/re-cert  Muscle weakness (generalized) - Plan: PT plan of care cert/re-cert     Problem List Patient Active  Problem List   Diagnosis Date Noted  . Trochanteric bursitis of left hip 12/11/2015  . Fatty infiltration of liver 12/30/2014  . Abnormal liver enzymes 12/30/2014  . Statin intolerance 06/30/2014  . Cholelithiasis 06/30/2014  . Left knee pain 06/30/2014  . Prediabetes 06/30/2014  . Hypothyroidism 12/01/2010  . Hyperlipidemia 12/01/2010  . Osteopenia 12/01/2010  . Allergic rhinitis 12/01/2010  . Allergic bronchitis 12/01/2010  . GE reflux 12/01/2010    Oretha Caprice, MPT  08/05/2016, 9:54 AM  [facility] [address] [place], [place], [address] Phone: [phone]   Fax:  [phone]  Name: Olivia Bender MRN: [id number] Date of Birth: [birth year]

## 2016-08-05 ENCOUNTER — Ambulatory Visit: Payer: Medicare Other | Attending: Internal Medicine | Admitting: Physical Therapy

## 2016-08-05 DIAGNOSIS — M5440 Lumbago with sciatica, unspecified side: Secondary | ICD-10-CM | POA: Diagnosis not present

## 2016-08-05 DIAGNOSIS — G8929 Other chronic pain: Secondary | ICD-10-CM | POA: Insufficient documentation

## 2016-08-05 DIAGNOSIS — M6281 Muscle weakness (generalized): Secondary | ICD-10-CM | POA: Diagnosis not present

## 2016-08-05 DIAGNOSIS — R262 Difficulty in walking, not elsewhere classified: Secondary | ICD-10-CM | POA: Insufficient documentation

## 2016-08-05 NOTE — Patient Instructions (Signed)
Iliotibial Band Stretch, Side-Lying   Lie on side, PLACE PILLOW OR BOLSTER UNDER YOUR WAIST,  back to edge of bed, top arm in front. Allow top leg to drape behind over edge. Hold 60 or more seconds up to 5 min.  Repeat 3 times per session. Do _2-3 sessions per day.  Lower abdominal/core stability exercises  1. Practice your breathing technique: Inhale through your nose expanding your belly and rib cage. Try not to breathe into your chest. Exhale slowly and gradually out your mouth feeling a sense of softness to your body. Practice multiple times. This can be performed unlimited.  2. Finding the lower abdominals. Laying on your back with the knees bent, place your fingers just below your belly button. Using your breathing technique from above, on your exhale gently pull the belly button away from your fingertips without tensing any other muscles. Practice this 5x. Next, as you exhale, draw belly button inwards and hold onto it...then feel as if you are pulling that muscle across your pelvis like you are tightening a belt. This can be hard to do at first so be patient and practice. Do 5-10 reps 1-3 x day. Always recognize quality over quantity; if your abdominal muscles become tired you will notice you may tighten/contract other muscles. This is the time to take a break.   Practice this first laying on your back, then in sitting, progressing to standing and finally adding it to all your daily movements.   3. Finding your pelvic floor. Using the breathing technique above, when your exhale, this time draw your pelvic floor muscles up as if you were attempting to stop the flow of urination. Be careful NOT to tense any other muscles. This can be hard, BE PATIENT. Try to hold up to 10 seconds repeating 10x. Try 2x a day. Once you feel you are doing this well, add this contraction to exercise #2. First contracting your pelvic floor followed by lower abdominals.   Trigger Point Dry Needling  . What is  Trigger Point Dry Needling (DN)? o DN is a physical therapy technique used to treat muscle pain and dysfunction. Specifically, DN helps deactivate muscle trigger points (muscle knots).  o A thin filiform needle is used to penetrate the skin and stimulate the underlying trigger point. The goal is for a local twitch response (LTR) to occur and for the trigger point to relax. No medication of any kind is injected during the procedure.   . What Does Trigger Point Dry Needling Feel Like?  o The procedure feels different for each individual patient. Some patients report that they do not actually feel the needle enter the skin and overall the process is not painful. Very mild bleeding may occur. However, many patients feel a deep cramping in the muscle in which the needle was inserted. This is the local twitch response.   Marland Kitchen How Will I feel after the treatment? o Soreness is normal, and the onset of soreness may not occur for a few hours. Typically this soreness does not last longer than two days.  o Bruising is uncommon, however; ice can be used to decrease any possible bruising.  o In rare cases feeling tired or nauseous after the treatment is normal. In addition, your symptoms may get worse before they get better, this period will typically not last longer than 24 hours.   . What Can I do After My Treatment? o Increase your hydration by drinking more water for the next 24 hours. o  You may place ice or heat on the areas treated that have become sore, however, do not use heat on inflamed or bruised areas. Heat often brings more relief post needling. o You can continue your regular activities, but vigorous activity is not recommended initially after the treatment for 24 hours. o DN is best combined with other physical therapy such as strengthening, stretching, and other therapies.    Precautions:  In some cases, dry needling is done over the lung field. While rare, there is a risk of pneumothorax  (punctured lung). Because of this, if you ever experience shortness of breath on exertion, difficulty taking a deep breath, chest pain or a dry cough following dry needling, you should report to an emergency room and tell them that you have been dry needled over the thorax.   Olivia Bender, PT 08/05/16 9:37 AM; [facility] [place], [place], [address] Phone: [phone]   Fax:  [phone]

## 2016-08-05 NOTE — Therapy (Signed)
[facility] [place], [place], [address] Phone: [phone]   Fax:  [phone]  Physical Therapy Treatment  Patient Details  Name: Olivia Bender MRN: [id number] Date of Birth: July 08, 1950 Referring Provider: Emeline General, MD  Encounter Date: 08/05/2016      PT End of Session - 08/05/16 0857    Visit Number 2   Number of Visits 12   PT Start Time 0900   PT Stop Time 0959   PT Time Calculation (min) 59 min   Activity Tolerance Patient tolerated treatment well   Behavior During Therapy [facility] for tasks assessed/performed      Past Medical History:  Diagnosis Date  . Allergy   . Anxiety   . Arthritis   . Constipation   . Generalized headaches   . Hyperlipidemia   . Osteopenia   . Thyroid disease    hypothyroidism    Past Surgical History:  Procedure Laterality Date  . NASAL SINUS SURGERY  1986    There were no vitals filed for this visit.      Subjective Assessment - 08/05/16 0858    Subjective Patient reports no pain this morning. She states she had some intermittent pain earlier when trying to pull herself into her truck.   Limitations House hold activities;Standing;Walking   Patient Stated Goals Stop hurting, be able to take care of my husband who just had a CABG surgery   Currently in Pain? No/denies                         [facility] Adult PT Treatment/Exercise - 08/05/16 0001      Exercises   Exercises Lumbar;Knee/Hip     Lumbar Exercises: Supine   Ab Set 10 reps;5 seconds  using pelvic floor to engage TA   AB Set Limitations also worked on diaphragmatic breathing   Bridge 5 seconds;20 reps     Knee/Hip Exercises: Stretches   Active Hamstring Stretch Left;5 reps;10 seconds   ITB Stretch Left;1 rep;60 seconds  using pillow under waist for QL stretch   Piriformis Stretch 2 reps;30 seconds  B     Knee/Hip Exercises: Aerobic   Nustep L5 x 10 min  VCs to tighten up abdominals      Modalities   Modalities Electrical Stimulation;Moist Heat     Moist Heat Therapy   Number Minutes Moist Heat 15 Minutes   Moist Heat Location Lumbar Spine     Electrical Stimulation   Electrical Stimulation Location L lumbar and gluteals/hip   Electrical Stimulation Action IFC   Electrical Stimulation Parameters 80-_0  x 15 min   Electrical Stimulation Goals Pain     Manual Therapy   Manual Therapy Myofascial release   Myofascial Release to L ITB                PT Education - 08/05/16 1223    Education provided Yes   Education Details HEP; self care: DN education and aftercare   Person(s) Educated Patient   Methods Explanation;Demonstration;Handout   Comprehension Verbalized understanding;Returned demonstration          PT Short Term Goals - 07/31/16 1442      PT SHORT TERM GOAL #1   Title Pt will be independent in her initial HEP   Time 3   Period Weeks   Status New           PT Long Term Goals - 07/31/16 1439  PT LONG TERM GOAL #1   Title Pt will be independent in her HEP and progression   Time 6   Period Weeks   Status New     PT LONG TERM GOAL #2   Title Pt will improve bilateral LE hip strength to 5/5 in order to improve functional mobility.    Baseline -4 to 4/5   Time 6   Period Weeks   Status New     PT LONG TERM GOAL #3   Title Pt will be able to improve her FOTO score from 49% limitation to </= 42% limitation   Time 6   Period Weeks   Status New     PT LONG TERM GOAL #4   Title Pt will be able to amb up and down a flight of stairs using single hand rail with pain less than/= 2/10.    Time 6   Period Weeks   Status New               Plan - 08/05/16 1224    Clinical Impression Statement Patient did well today with TE and NRE. She was able to engage TA using her pelvic floor. She demos trigger points in her L QL, gluteals and ITB which may benefit from DN and more STW. She tolerated manual therapy well. No  goals met today as only second visit.   Rehab Potential Good   PT Frequency 2x / week   PT Duration 6 weeks   PT Treatment/Interventions ADLs/Self Care Home Management;Electrical Stimulation;Iontophoresis 69m/ml Dexamethasone;Functional mobility training;Stair training;Gait training;Moist Heat;Traction;Therapeutic activities;Therapeutic exercise;Patient/family education;Passive range of motion;Manual techniques;Dry needling   PT Next Visit Plan  hamstring stretching, core strengthening, manual therapy, DN prn;  IFC as appropriate   PT Home Exercise Plan TA/breathing/pelvic floor engagement; SDLY QL and ITB stretch; piriformis strech, bridging   Consulted and Agree with Plan of Care Patient      Patient will benefit from skilled therapeutic intervention in order to improve the following deficits and impairments:  Postural dysfunction, Decreased mobility, Decreased strength, Pain, Difficulty walking, Decreased activity tolerance, Impaired flexibility  Visit Diagnosis: Chronic left-sided low back pain with sciatica, sciatica laterality unspecified     Problem List Patient Active Problem List   Diagnosis Date Noted  . Trochanteric bursitis of left hip 12/11/2015  . Fatty infiltration of liver 12/30/2014  . Abnormal liver enzymes 12/30/2014  . Statin intolerance 06/30/2014  . Cholelithiasis 06/30/2014  . Left knee pain 06/30/2014  . Prediabetes 06/30/2014  . Hypothyroidism 12/01/2010  . Hyperlipidemia 12/01/2010  . Osteopenia 12/01/2010  . Allergic rhinitis 12/01/2010  . Allergic bronchitis 12/01/2010  . GE reflux 12/01/2010    JMadelyn FlavorsPT 08/05/2016, 12:29 PM  [facility] [address] [address]Phone: [phone]  Fax:  [phone] Name: Olivia MCFAULMRN: [id number] of Birth: 1Feb 15, 1952

## 2016-08-06 ENCOUNTER — Encounter: Payer: Self-pay | Admitting: Physical Therapy

## 2016-08-06 ENCOUNTER — Ambulatory Visit: Payer: Medicare Other | Admitting: Physical Therapy

## 2016-08-06 DIAGNOSIS — R262 Difficulty in walking, not elsewhere classified: Secondary | ICD-10-CM | POA: Diagnosis not present

## 2016-08-06 DIAGNOSIS — G8929 Other chronic pain: Secondary | ICD-10-CM | POA: Diagnosis not present

## 2016-08-06 DIAGNOSIS — M6281 Muscle weakness (generalized): Secondary | ICD-10-CM | POA: Diagnosis not present

## 2016-08-06 DIAGNOSIS — M5440 Lumbago with sciatica, unspecified side: Principal | ICD-10-CM

## 2016-08-06 NOTE — Therapy (Signed)
[facility] [place], [place], [address] Phone: [phone]   Fax:  [phone]  Physical Therapy Treatment  Patient Details  Name: Olivia Bender MRN: [id number] Date of Birth: 01/03/1951 Referring Provider: Emeline General, MD  Encounter Date: 08/06/2016      PT End of Session - 08/06/16 0817    Visit Number 3   Number of Visits 12   PT Start Time 0815   PT Stop Time 0906   PT Time Calculation (min) 51 min   Activity Tolerance Patient tolerated treatment well   Behavior During Therapy [facility] for tasks assessed/performed      Past Medical History:  Diagnosis Date  . Allergy   . Anxiety   . Arthritis   . Constipation   . Generalized headaches   . Hyperlipidemia   . Osteopenia   . Thyroid disease    hypothyroidism    Past Surgical History:  Procedure Laterality Date  . NASAL SINUS SURGERY  1986    There were no vitals filed for this visit.      Subjective Assessment - 08/06/16 0816    Subjective Reports that she had stinging down the lateral aspect of L thigh last night.   Limitations House hold activities;Standing;Walking   Patient Stated Goals Stop hurting, be able to take care of my husband who just had a CABG surgery   Currently in Pain? Yes   Pain Score 4    Pain Location Leg   Pain Orientation Left;Upper;Lateral   Pain Descriptors / Indicators Sore   Pain Type Chronic pain   Pain Onset More than a month ago            [facility] PT Assessment - 08/06/16 0001      Assessment   Medical Diagnosis left low back pain   Hand Dominance Right   Next MD Visit 4-6 weeks following therapy   Prior Therapy on left shoulder a few years ago     Precautions   Precautions None                     [facility] Adult PT Treatment/Exercise - 08/06/16 0001      Lumbar Exercises: Stretches   Passive Hamstring Stretch 3 reps;30 seconds   ITB Stretch 3 reps;30 seconds     Lumbar Exercises: Aerobic   Stationary Bike NuStep L4 x10 min     Lumbar Exercises: Supine   Ab Set 20 reps;3 seconds   AB Set Limitations with cues for pelvic floor and core   Bent Knee Raise 15 reps   Bridge 15 reps;3 seconds   Other Supine Lumbar Exercises Kegel's 5 sec hold x20 reps     Modalities   Modalities Electrical Stimulation;Moist Heat     Moist Heat Therapy   Number Minutes Moist Heat 15 Minutes   Moist Heat Location Other (comment)  L thigh     Electrical Stimulation   Electrical Stimulation Location L distal ITB   Electrical Stimulation Action Pre-Mod   Electrical Stimulation Parameters 80-150 hz x15 min   Electrical Stimulation Goals Pain     Manual Therapy   Manual Therapy Myofascial release   Myofascial Release MFR/TPR to L distal ITB to reduce tone and TPs noted in distal ITB                PT Education - 08/05/16 1223    Education provided Yes   Education Details HEP; self care: DN education  and aftercare   Person(s) Educated Patient   Methods Explanation;Demonstration;Handout   Comprehension Verbalized understanding;Returned demonstration          PT Short Term Goals - 07/31/16 1442      PT SHORT TERM GOAL #1   Title Pt will be independent in her initial HEP   Time 3   Period Weeks   Status New           PT Long Term Goals - 07/31/16 1439      PT LONG TERM GOAL #1   Title Pt will be independent in her HEP and progression   Time 6   Period Weeks   Status New     PT LONG TERM GOAL #2   Title Pt will improve bilateral LE hip strength to 5/5 in order to improve functional mobility.    Baseline -4 to 4/5   Time 6   Period Weeks   Status New     PT LONG TERM GOAL #3   Title Pt will be able to improve her FOTO score from 49% limitation to </= 42% limitation   Time 6   Period Weeks   Status New     PT LONG TERM GOAL #4   Title Pt will be able to amb up and down a flight of stairs using single hand rail with pain less than/= 2/10.    Time 6    Period Weeks   Status New               Plan - 08/06/16 [id number]    Clinical Impression Statement Patient tolerated today's treatment well as she had no reports of any increased pain from today's treatment. TPs and tone noted in distal L ITB today with reduction following passive stretching and manual therapy. Patient guided through pelvic and core activation exercises in which core and pelvic floor activation encouraged with each exercise. Normal modalities response noted following removal of the modalities. Patient encouraged to continue HEP provided in previous treatment at home as we can progress in clinic.   Rehab Potential Good   PT Frequency 2x / week   PT Duration 6 weeks   PT Treatment/Interventions ADLs/Self Care Home Management;Electrical Stimulation;Iontophoresis 4mg /ml Dexamethasone;Functional mobility training;Stair training;Gait training;Moist Heat;Traction;Therapeutic activities;Therapeutic exercise;Patient/family education;Passive range of motion;Manual techniques;Dry needling   PT Next Visit Plan  hamstring stretching, core strengthening, manual therapy, DN prn;  IFC as appropriate   PT Home Exercise Plan TA/breathing/pelvic floor engagement; SDLY QL and ITB stretch; piriformis strech, bridging   Consulted and Agree with Plan of Care Patient      Patient will benefit from skilled therapeutic intervention in order to improve the following deficits and impairments:  Postural dysfunction, Decreased mobility, Decreased strength, Pain, Difficulty walking, Decreased activity tolerance, Impaired flexibility  Visit Diagnosis: Chronic left-sided low back pain with sciatica, sciatica laterality unspecified  Difficulty in walking, not elsewhere classified  Muscle weakness (generalized)     Problem List Patient Active Problem List   Diagnosis Date Noted  . Trochanteric bursitis of left hip 12/11/2015  . Fatty infiltration of liver 12/30/2014  . Abnormal liver enzymes  12/30/2014  . Statin intolerance 06/30/2014  . Cholelithiasis 06/30/2014  . Left knee pain 06/30/2014  . Prediabetes 06/30/2014  . Hypothyroidism 12/01/2010  . Hyperlipidemia 12/01/2010  . Osteopenia 12/01/2010  . Allergic rhinitis 12/01/2010  . Allergic bronchitis 12/01/2010  . GE reflux 12/01/2010    Wynelle Fanny, PTA 08/06/2016, 9:10 AM  [facility] [place], [place], [address] Phone: [phone]   Fax:  [phone]  Name: Olivia Bender MRN: [id number] Date of Birth: 05-04-1951

## 2016-08-07 DIAGNOSIS — Z1231 Encounter for screening mammogram for malignant neoplasm of breast: Secondary | ICD-10-CM | POA: Diagnosis not present

## 2016-08-07 DIAGNOSIS — M8589 Other specified disorders of bone density and structure, multiple sites: Secondary | ICD-10-CM | POA: Diagnosis not present

## 2016-08-12 ENCOUNTER — Encounter: Payer: Medicare Other | Admitting: Physical Therapy

## 2016-08-13 ENCOUNTER — Encounter: Payer: Medicare Other | Admitting: Physical Therapy

## 2016-08-14 ENCOUNTER — Encounter: Payer: Self-pay | Admitting: Physical Therapy

## 2016-08-14 ENCOUNTER — Ambulatory Visit: Payer: Medicare Other | Admitting: Physical Therapy

## 2016-08-14 DIAGNOSIS — G8929 Other chronic pain: Secondary | ICD-10-CM

## 2016-08-14 DIAGNOSIS — M5440 Lumbago with sciatica, unspecified side: Secondary | ICD-10-CM | POA: Diagnosis not present

## 2016-08-14 DIAGNOSIS — M6281 Muscle weakness (generalized): Secondary | ICD-10-CM

## 2016-08-14 DIAGNOSIS — R262 Difficulty in walking, not elsewhere classified: Secondary | ICD-10-CM | POA: Diagnosis not present

## 2016-08-14 NOTE — Therapy (Signed)
[facility] [place], [place], [address] Phone: [phone]   Fax:  [phone]  Physical Therapy Treatment  Patient Details  Name: Olivia Bender MRN: [id number] Date of Birth: [birth year] Referring Provider: Emeline General, MD  Encounter Date: 08/14/2016      PT End of Session - 08/14/16 0935    Visit Number 4   Number of Visits 12   PT Start Time 0900   PT Stop Time 0955   PT Time Calculation (min) 55 min   Activity Tolerance Patient tolerated treatment well   Behavior During Therapy [facility] for tasks assessed/performed      Past Medical History:  Diagnosis Date  . Allergy   . Anxiety   . Arthritis   . Constipation   . Generalized headaches   . Hyperlipidemia   . Osteopenia   . Thyroid disease    hypothyroidism    Past Surgical History:  Procedure Laterality Date  . NASAL SINUS SURGERY  1986    There were no vitals filed for this visit.      Subjective Assessment - 08/14/16 0909    Subjective Patient feeling good today and no pain complaints   Limitations House hold activities;Standing;Walking   Patient Stated Goals Stop hurting, be able to take care of my husband who just had a CABG surgery   Currently in Pain? No/denies                         [facility] Adult PT Treatment/Exercise - 08/14/16 0001      Lumbar Exercises: Stretches   Piriformis Stretch 3 reps;20 seconds  bil      Lumbar Exercises: Aerobic   Stationary Bike NuStep L4 x10 min UE/LE activity     Lumbar Exercises: Supine   Ab Set 20 reps;3 seconds   AB Set Limitations with cues for pelvic floor and core   Bent Knee Raise Other (comment);3 seconds  2x10   Straight Leg Raise 3 seconds  2x10     Knee/Hip Exercises: Standing   Lateral Step Up Both;10 reps;Step Height: 6"   Forward Step Up Both;2 sets;10 reps;Step Height: 6";Hand Hold: 2     Knee/Hip Exercises: Sidelying   Hip ABduction Both;2 sets;10 reps     Moist Heat Therapy   Number Minutes Moist Heat 15 Minutes   Moist Heat Location Other (comment)     Electrical Stimulation   Electrical Stimulation Location L distal ITB   Electrical Stimulation Action premod   Electrical Stimulation Parameters 80-150z x29min   Electrical Stimulation Goals Pain                  PT Short Term Goals - 07/31/16 1442      PT SHORT TERM GOAL #1   Title Pt will be independent in her initial HEP   Time 3   Period Weeks   Status New           PT Long Term Goals - 07/31/16 1439      PT LONG TERM GOAL #1   Title Pt will be independent in her HEP and progression   Time 6   Period Weeks   Status New     PT LONG TERM GOAL #2   Title Pt will improve bilateral LE hip strength to 5/5 in order to improve functional mobility.    Baseline -4 to 4/5   Time 6   Period Weeks   Status  New     PT LONG TERM GOAL #3   Title Pt will be able to improve her FOTO score from 49% limitation to </= 42% limitation   Time 6   Period Weeks   Status New     PT LONG TERM GOAL #4   Title Pt will be able to amb up and down a flight of stairs using single hand rail with pain less than/= 2/10.    Time 6   Period Weeks   Status New               Plan - 08/14/16 0936    Clinical Impression Statement Patient tolerated treatment well today. Patient reported no pain pre or post treatment today. Patient has some pain that refers down left LE at times. Patient requested heat/ES post treatment today. Patient feels like theraapy is helping. Patient has no follow up with MD until after she completes her therapy. Current goals ongoing due to pain and strength deficts.    Rehab Potential Good   PT Frequency 2x / week   PT Duration 6 weeks   PT Treatment/Interventions ADLs/Self Care Home Management;Electrical Stimulation;Iontophoresis 4mg /ml Dexamethasone;Functional mobility training;Stair training;Gait training;Moist Heat;Traction;Therapeutic  activities;Therapeutic exercise;Patient/family education;Passive range of motion;Manual techniques;Dry needling   PT Next Visit Plan  cont with POC for hamstring stretching, core strengthening, manual therapy, DN prn;  IFC as appropriate   Consulted and Agree with Plan of Care Patient      Patient will benefit from skilled therapeutic intervention in order to improve the following deficits and impairments:  Postural dysfunction, Decreased mobility, Decreased strength, Pain, Difficulty walking, Decreased activity tolerance, Impaired flexibility  Visit Diagnosis: Chronic left-sided low back pain with sciatica, sciatica laterality unspecified  Difficulty in walking, not elsewhere classified  Muscle weakness (generalized)     Problem List Patient Active Problem List   Diagnosis Date Noted  . Trochanteric bursitis of left hip 12/11/2015  . Fatty infiltration of liver 12/30/2014  . Abnormal liver enzymes 12/30/2014  . Statin intolerance 06/30/2014  . Cholelithiasis 06/30/2014  . Left knee pain 06/30/2014  . Prediabetes 06/30/2014  . Hypothyroidism 12/01/2010  . Hyperlipidemia 12/01/2010  . Osteopenia 12/01/2010  . Allergic rhinitis 12/01/2010  . Allergic bronchitis 12/01/2010  . GE reflux 12/01/2010    Phillips Climes, PTA 08/14/2016, 9:57 AM  [facility] [place], [place], [address] Phone: [phone]   Fax:  [phone]  Name: Olivia Bender MRN: [id number] Date of Birth: 1950/10/17

## 2016-08-19 ENCOUNTER — Encounter: Payer: Self-pay | Admitting: Physical Therapy

## 2016-08-19 ENCOUNTER — Ambulatory Visit: Payer: Medicare Other | Admitting: Physical Therapy

## 2016-08-19 DIAGNOSIS — G8929 Other chronic pain: Secondary | ICD-10-CM

## 2016-08-19 DIAGNOSIS — R262 Difficulty in walking, not elsewhere classified: Secondary | ICD-10-CM

## 2016-08-19 DIAGNOSIS — M5440 Lumbago with sciatica, unspecified side: Principal | ICD-10-CM

## 2016-08-19 DIAGNOSIS — M6281 Muscle weakness (generalized): Secondary | ICD-10-CM | POA: Diagnosis not present

## 2016-08-19 NOTE — Therapy (Signed)
[facility] [place], [place], [address] Phone: [phone]   Fax:  [phone]  Physical Therapy Treatment  Patient Details  Name: Olivia Bender MRN: [id number] Date of Birth: 1951/05/15 Referring Provider: Emeline General, MD  Encounter Date: 08/19/2016      PT End of Session - 08/19/16 3762    Visit Number 5   Number of Visits 12   PT Start Time 0817   PT Stop Time 0857   PT Time Calculation (min) 40 min   Activity Tolerance Patient tolerated treatment well   Behavior During Therapy [facility] for tasks assessed/performed      Past Medical History:  Diagnosis Date  . Allergy   . Anxiety   . Arthritis   . Constipation   . Generalized headaches   . Hyperlipidemia   . Osteopenia   . Thyroid disease    hypothyroidism    Past Surgical History:  Procedure Laterality Date  . NASAL SINUS SURGERY  1986    There were no vitals filed for this visit.      Subjective Assessment - 08/19/16 0812    Subjective Reports that her back is better as pain is not limiting her as much. Reports that she walked a lot friday and saturday and had no pain only fatigue from increased activity.   Limitations House hold activities;Standing;Walking   Patient Stated Goals Stop hurting, be able to take care of my husband who just had a CABG surgery   Currently in Pain? No/denies            [facility] PT Assessment - 08/19/16 0001      Assessment   Medical Diagnosis left low back pain   Hand Dominance Right   Next MD Visit 4-6 weeks following therapy   Prior Therapy on left shoulder a few years ago     Precautions   Precautions None                     [facility] Adult PT Treatment/Exercise - 08/19/16 0001      Lumbar Exercises: Stretches   Passive Hamstring Stretch 3 reps;30 seconds   Passive Hamstring Stretch Limitations BLE   Piriformis Stretch 4 reps;10 seconds   Piriformis Stretch Limitations BLE     Lumbar  Exercises: Aerobic   Stationary Bike NuStep L5 x10 min     Lumbar Exercises: Supine   Bridge 20 reps;3 seconds   Straight Leg Raise 20 reps;3 seconds     Knee/Hip Exercises: Standing   Heel Raises Both;2 sets;10 reps   Forward Step Up Both;3 sets;10 reps;Hand Hold: 2;Step Height: 6"     Knee/Hip Exercises: Seated   Long Arc Quad Strengthening;Both;3 sets;10 reps;Weights   Long Arc Quad Weight 4 lbs.     Knee/Hip Exercises: Supine   Straight Leg Raise with External Rotation Strengthening;Both;2 sets;10 reps     Knee/Hip Exercises: Sidelying   Hip ABduction AROM;Both;2 sets;10 reps     Manual Therapy   Manual Therapy Myofascial release   Myofascial Release MFR/TPR to L distal ITB to reduce tone and TPs noted in distal ITB                  PT Short Term Goals - 08/19/16 0813      PT SHORT TERM GOAL #1   Title Pt will be independent in her initial HEP   Time 3   Period Weeks   Status Achieved  PT Long Term Goals - 07/31/16 1439      PT LONG TERM GOAL #1   Title Pt will be independent in her HEP and progression   Time 6   Period Weeks   Status New     PT LONG TERM GOAL #2   Title Pt will improve bilateral LE hip strength to 5/5 in order to improve functional mobility.    Baseline -4 to 4/5   Time 6   Period Weeks   Status New     PT LONG TERM GOAL #3   Title Pt will be able to improve her FOTO score from 49% limitation to </= 42% limitation   Time 6   Period Weeks   Status New     PT LONG TERM GOAL #4   Title Pt will be able to amb up and down a flight of stairs using single hand rail with pain less than/= 2/10.    Time 6   Period Weeks   Status New             Patient will benefit from skilled therapeutic intervention in order to improve the following deficits and impairments:     Visit Diagnosis: Chronic left-sided low back pain with sciatica, sciatica laterality unspecified  Difficulty in walking, not elsewhere  classified  Muscle weakness (generalized)     Problem List Patient Active Problem List   Diagnosis Date Noted  . Trochanteric bursitis of left hip 12/11/2015  . Fatty infiltration of liver 12/30/2014  . Abnormal liver enzymes 12/30/2014  . Statin intolerance 06/30/2014  . Cholelithiasis 06/30/2014  . Left knee pain 06/30/2014  . Prediabetes 06/30/2014  . Hypothyroidism 12/01/2010  . Hyperlipidemia 12/01/2010  . Osteopenia 12/01/2010  . Allergic rhinitis 12/01/2010  . Allergic bronchitis 12/01/2010  . GE reflux 12/01/2010    Wynelle Fanny, PTA 08/19/2016, 9:00 AM  [facility] [place], [place], [address] Phone: [phone]   Fax:  [phone]  Name: Olivia Bender MRN: [id number] Date of Birth: 10-Jun-1950

## 2016-08-21 ENCOUNTER — Ambulatory Visit: Payer: Medicare Other | Admitting: Physical Therapy

## 2016-08-21 ENCOUNTER — Encounter: Payer: Self-pay | Admitting: Physical Therapy

## 2016-08-21 DIAGNOSIS — G8929 Other chronic pain: Secondary | ICD-10-CM

## 2016-08-21 DIAGNOSIS — M5440 Lumbago with sciatica, unspecified side: Principal | ICD-10-CM

## 2016-08-21 DIAGNOSIS — M6281 Muscle weakness (generalized): Secondary | ICD-10-CM

## 2016-08-21 DIAGNOSIS — R262 Difficulty in walking, not elsewhere classified: Secondary | ICD-10-CM | POA: Diagnosis not present

## 2016-08-21 NOTE — Therapy (Signed)
[facility] [place], [place], [address] Phone: [phone]   Fax:  [phone]  Physical Therapy Treatment  Patient Details  Name: Olivia Bender MRN: [id number] Date of Birth: 08-12-1950 Referring Provider: Emeline General, MD  Encounter Date: 08/21/2016      PT End of Session - 08/21/16 0817    Visit Number 6   Number of Visits 12   PT Start Time 0815   PT Stop Time 0071   PT Time Calculation (min) 42 min   Activity Tolerance Patient tolerated treatment well   Behavior During Therapy [facility] for tasks assessed/performed      Past Medical History:  Diagnosis Date  . Allergy   . Anxiety   . Arthritis   . Constipation   . Generalized headaches   . Hyperlipidemia   . Osteopenia   . Thyroid disease    hypothyroidism    Past Surgical History:  Procedure Laterality Date  . NASAL SINUS SURGERY  1986    There were no vitals filed for this visit.      Subjective Assessment - 08/21/16 0816    Subjective Reports that she woke with a little pain this morning but none now.   Limitations House hold activities;Standing;Walking   Patient Stated Goals Stop hurting, be able to take care of my husband who just had a CABG surgery   Currently in Pain? No/denies            [facility] PT Assessment - 08/21/16 0001      Assessment   Medical Diagnosis left low back pain   Hand Dominance Right   Next MD Visit 4-6 weeks following therapy   Prior Therapy on left shoulder a few years ago     Precautions   Precautions None                     [facility] Adult PT Treatment/Exercise - 08/21/16 0001      Lumbar Exercises: Aerobic   Stationary Bike NuStep L6 x10 min     Lumbar Exercises: Machines for Strengthening   Cybex Knee Extension 10# 2x10 reps   Cybex Knee Flexion 30# 2x10 reps     Lumbar Exercises: Standing   Row Strengthening;Both;20 reps   Row Limitations Pink XTS   Shoulder Extension  Strengthening;Both;20 reps   Shoulder Extension Limitations Pink XTS     Lumbar Exercises: Supine   Bent Knee Raise 20 reps;4 seconds  with yellow theraband   Bridge 20 reps;4 seconds  with yellow theraband   Straight Leg Raise Other (comment)  3x10 reps      Knee/Hip Exercises: Supine   Straight Leg Raise with External Rotation Strengthening;Both;2 sets;10 reps     Knee/Hip Exercises: Sidelying   Hip ABduction AROM;Both;2 sets;10 reps   Clams x20 reps per LE                  PT Short Term Goals - 08/19/16 0813      PT SHORT TERM GOAL #1   Title Pt will be independent in her initial HEP   Time 3   Period Weeks   Status Achieved           PT Long Term Goals - 07/31/16 1439      PT LONG TERM GOAL #1   Title Pt will be independent in her HEP and progression   Time 6   Period Weeks   Status New  PT LONG TERM GOAL #2   Title Pt will improve bilateral LE hip strength to 5/5 in order to improve functional mobility.    Baseline -4 to 4/5   Time 6   Period Weeks   Status New     PT LONG TERM GOAL #3   Title Pt will be able to improve her FOTO score from 49% limitation to </= 42% limitation   Time 6   Period Weeks   Status New     PT LONG TERM GOAL #4   Title Pt will be able to amb up and down a flight of stairs using single hand rail with pain less than/= 2/10.    Time 6   Period Weeks   Status New               Plan - 08/21/16 3202    Clinical Impression Statement Patient tolerated today's treatment well with no reports of any low back pain only LE fatigue with strengthening exercises. Patient was guided through LE and core strengthening with resistance with certain exercisesand VCs for core activation. No modalities completed today as patient did not request them.   Rehab Potential Good   PT Frequency 2x / week   PT Duration 6 weeks   PT Treatment/Interventions ADLs/Self Care Home Management;Electrical Stimulation;Iontophoresis 4mg /ml  Dexamethasone;Functional mobility training;Stair training;Gait training;Moist Heat;Traction;Therapeutic activities;Therapeutic exercise;Patient/family education;Passive range of motion;Manual techniques;Dry needling   PT Next Visit Plan  cont with POC for hamstring stretching, core strengthening, manual therapy, DN prn;  IFC as appropriate   PT Home Exercise Plan TA/breathing/pelvic floor engagement; SDLY QL and ITB stretch; piriformis strech, bridging   Consulted and Agree with Plan of Care Patient      Patient will benefit from skilled therapeutic intervention in order to improve the following deficits and impairments:  Postural dysfunction, Decreased mobility, Decreased strength, Pain, Difficulty walking, Decreased activity tolerance, Impaired flexibility  Visit Diagnosis: Chronic left-sided low back pain with sciatica, sciatica laterality unspecified  Difficulty in walking, not elsewhere classified  Muscle weakness (generalized)     Problem List Patient Active Problem List   Diagnosis Date Noted  . Trochanteric bursitis of left hip 12/11/2015  . Fatty infiltration of liver 12/30/2014  . Abnormal liver enzymes 12/30/2014  . Statin intolerance 06/30/2014  . Cholelithiasis 06/30/2014  . Left knee pain 06/30/2014  . Prediabetes 06/30/2014  . Hypothyroidism 12/01/2010  . Hyperlipidemia 12/01/2010  . Osteopenia 12/01/2010  . Allergic rhinitis 12/01/2010  . Allergic bronchitis 12/01/2010  . GE reflux 12/01/2010    Wynelle Fanny, PTA 08/21/2016, 9:01 AM  [facility] [address] [place], [place], [address] Phone: [phone]   Fax:  [phone]  Name: Olivia Bender MRN: [id number] Date of Birth: [birth year]

## 2016-08-26 ENCOUNTER — Encounter: Payer: Self-pay | Admitting: Physical Therapy

## 2016-08-26 ENCOUNTER — Ambulatory Visit: Payer: Medicare Other | Admitting: Physical Therapy

## 2016-08-26 DIAGNOSIS — M6281 Muscle weakness (generalized): Secondary | ICD-10-CM | POA: Diagnosis not present

## 2016-08-26 DIAGNOSIS — G8929 Other chronic pain: Secondary | ICD-10-CM | POA: Diagnosis not present

## 2016-08-26 DIAGNOSIS — M5440 Lumbago with sciatica, unspecified side: Secondary | ICD-10-CM | POA: Diagnosis not present

## 2016-08-26 DIAGNOSIS — R262 Difficulty in walking, not elsewhere classified: Secondary | ICD-10-CM

## 2016-08-26 NOTE — Therapy (Signed)
[facility] [place], [place], [address] Phone: [phone]   Fax:  [phone]  Physical Therapy Treatment  Patient Details  Name: Olivia Bender MRN: [id number] Date of Birth: [birth year] Referring Provider: Emeline General, MD  Encounter Date: 08/26/2016      PT End of Session - 08/26/16 0817    Visit Number 7   Number of Visits 12   PT Start Time 0814   PT Stop Time 0858   PT Time Calculation (min) 44 min   Activity Tolerance Patient tolerated treatment well   Behavior During Therapy [facility] for tasks assessed/performed      Past Medical History:  Diagnosis Date  . Allergy   . Anxiety   . Arthritis   . Constipation   . Generalized headaches   . Hyperlipidemia   . Osteopenia   . Thyroid disease    hypothyroidism    Past Surgical History:  Procedure Laterality Date  . NASAL SINUS SURGERY  1986    There were no vitals filed for this visit.      Subjective Assessment - 08/26/16 0817    Subjective Reports that she has some LBP this morning but thinks it may be from getting up from her floor to do exercises. Reports improvement with stair ambulation as well as getting into and out of pick up truck.   Limitations House hold activities;Standing;Walking   Patient Stated Goals Stop hurting, be able to take care of my husband who just had a CABG surgery   Currently in Pain? Yes   Pain Score 2    Pain Location Back   Pain Orientation Lower   Pain Descriptors / Indicators Sore   Pain Type Chronic pain   Pain Onset More than a month ago   Aggravating Factors  None            [facility] PT Assessment - 08/26/16 0001      Assessment   Medical Diagnosis left low back pain   Hand Dominance Right   Next MD Visit 4-6 weeks following therapy   Prior Therapy on left shoulder a few years ago     Precautions   Precautions None                     [facility] Adult PT Treatment/Exercise - 08/26/16 0001       Ambulation/Gait   Stairs Yes   Stairs Assistance 7: Independent   Stair Management Technique One rail Left;Alternating pattern;Forwards   Number of Stairs 4  x2 RT   Height of Stairs 7     Lumbar Exercises: Aerobic   Stationary Bike NuStep L6 x15 min     Lumbar Exercises: Machines for Strengthening   Cybex Knee Extension 10# 3x10 reps   Cybex Knee Flexion 30# 3x10 reps     Lumbar Exercises: Standing   Shoulder Extension Strengthening;Both;20 reps   Shoulder Extension Limitations Pink XTS     Lumbar Exercises: Supine   Bent Knee Raise 20 reps;4 seconds   Bridge 20 reps;4 seconds   Straight Leg Raise Other (comment)  BLE 3x10 reps     Knee/Hip Exercises: Supine   Straight Leg Raise with External Rotation Strengthening;Both;2 sets;10 reps     Knee/Hip Exercises: Sidelying   Hip ABduction AROM;Both;2 sets;10 reps     Manual Therapy   Manual Therapy Soft tissue mobilization   Soft tissue mobilization STW to L ITB and lateral glute to reduce tone and soreness  PT Short Term Goals - 08/19/16 0813      PT SHORT TERM GOAL #1   Title Pt will be independent in her initial HEP   Time 3   Period Weeks   Status Achieved           PT Long Term Goals - 08/26/16 0840      PT LONG TERM GOAL #1   Title Pt will be independent in her HEP and progression   Time 6   Period Weeks   Status On-going     PT LONG TERM GOAL #2   Title Pt will improve bilateral LE hip strength to 5/5 in order to improve functional mobility.    Baseline -4 to 4/5   Time 6   Period Weeks   Status On-going     PT LONG TERM GOAL #3   Title Pt will be able to improve her FOTO score from 49% limitation to </= 42% limitation   Time 6   Period Weeks   Status On-going     PT LONG TERM GOAL #4   Title Pt will be able to amb up and down a flight of stairs using single hand rail with pain less than/= 2/10.    Time 6   Period Weeks   Status Achieved  Achieved 08/26/2016  with no pain per patient report               Plan - 08/26/16 8338    Clinical Impression Statement Patient tolerated today's treatment well with only reports of LE weakness during exercises. Patient demonstrated improvement in regards to stair ambulation with no pain reported during assessment in clinic. Patient instructed to complete exercises with core activation and with VCs for rest if needed.  Greatest weakness noted as knee extension on machine with 10#. Tightness and soreness reported by patient along superior L ITB and lateral glute with reduction during manual therapy. Patient continued to report some soreness following treatment today in L hip.   Rehab Potential Good   PT Frequency 2x / week   PT Duration 6 weeks   PT Treatment/Interventions ADLs/Self Care Home Management;Electrical Stimulation;Iontophoresis 4mg /ml Dexamethasone;Functional mobility training;Stair training;Gait training;Moist Heat;Traction;Therapeutic activities;Therapeutic exercise;Patient/family education;Passive range of motion;Manual techniques;Dry needling   PT Next Visit Plan Continue with POC with strength assessment for goal assessment.   PT Home Exercise Plan TA/breathing/pelvic floor engagement; SDLY QL and ITB stretch; piriformis strech, bridging   Consulted and Agree with Plan of Care Patient      Patient will benefit from skilled therapeutic intervention in order to improve the following deficits and impairments:  Postural dysfunction, Decreased mobility, Decreased strength, Pain, Difficulty walking, Decreased activity tolerance, Impaired flexibility  Visit Diagnosis: Chronic left-sided low back pain with sciatica, sciatica laterality unspecified  Difficulty in walking, not elsewhere classified  Muscle weakness (generalized)     Problem List Patient Active Problem List   Diagnosis Date Noted  . Trochanteric bursitis of left hip 12/11/2015  . Fatty infiltration of liver 12/30/2014  .  Abnormal liver enzymes 12/30/2014  . Statin intolerance 06/30/2014  . Cholelithiasis 06/30/2014  . Left knee pain 06/30/2014  . Prediabetes 06/30/2014  . Hypothyroidism 12/01/2010  . Hyperlipidemia 12/01/2010  . Osteopenia 12/01/2010  . Allergic rhinitis 12/01/2010  . Allergic bronchitis 12/01/2010  . GE reflux 12/01/2010    Wynelle Fanny, PTA 08/26/2016, 9:05 AM  [facility] [address] [place], [place], [address] Phone: [phone]   Fax:  [phone]  Name: Olivia Bender MRN: [id number] Date of Birth: 1950/07/27

## 2016-08-28 ENCOUNTER — Encounter: Payer: Self-pay | Admitting: Physical Therapy

## 2016-08-28 ENCOUNTER — Ambulatory Visit: Payer: Medicare Other | Admitting: Physical Therapy

## 2016-08-28 DIAGNOSIS — R262 Difficulty in walking, not elsewhere classified: Secondary | ICD-10-CM | POA: Diagnosis not present

## 2016-08-28 DIAGNOSIS — G8929 Other chronic pain: Secondary | ICD-10-CM

## 2016-08-28 DIAGNOSIS — M5440 Lumbago with sciatica, unspecified side: Principal | ICD-10-CM

## 2016-08-28 DIAGNOSIS — M6281 Muscle weakness (generalized): Secondary | ICD-10-CM | POA: Diagnosis not present

## 2016-08-28 NOTE — Therapy (Signed)
[facility] [place], [place], [address] Phone: [phone]   Fax:  [phone]  Physical Therapy Treatment  Patient Details  Name: Olivia Bender MRN: [id number] Date of Birth: March 13, 1951 Referring Provider: Emeline General, MD  Encounter Date: 08/28/2016      PT End of Session - 08/28/16 0815    Visit Number 8   Number of Visits 12   PT Start Time 0812   PT Stop Time 0855   PT Time Calculation (min) 43 min   Activity Tolerance Patient tolerated treatment well   Behavior During Therapy [facility] for tasks assessed/performed      Past Medical History:  Diagnosis Date  . Allergy   . Anxiety   . Arthritis   . Constipation   . Generalized headaches   . Hyperlipidemia   . Osteopenia   . Thyroid disease    hypothyroidism    Past Surgical History:  Procedure Laterality Date  . NASAL SINUS SURGERY  1986    There were no vitals filed for this visit.      Subjective Assessment - 08/28/16 0814    Subjective Denies any pain or any complaints.   Limitations House hold activities;Standing;Walking   Patient Stated Goals Stop hurting, be able to take care of my husband who just had a CABG surgery   Currently in Pain? No/denies            [facility] PT Assessment - 08/28/16 0001      Assessment   Medical Diagnosis left low back pain   Hand Dominance Right   Next MD Visit 4-6 weeks following therapy   Prior Therapy on left shoulder a few years ago     Precautions   Precautions None     ROM / Strength   AROM / PROM / Strength Strength     Strength   Overall Strength Deficits   Strength Assessment Site Hip;Knee   Right/Left Hip Right;Left   Right Hip Flexion 4+/5   Right Hip ABduction 5/5   Left Hip Flexion 4+/5   Left Hip ABduction 4/5   Right/Left Knee Right;Left   Right Knee Flexion 4+/5   Right Knee Extension 4+/5   Left Knee Flexion 4+/5   Left Knee Extension 4+/5                      [facility] Adult PT Treatment/Exercise - 08/28/16 0001      Lumbar Exercises: Aerobic   Stationary Bike NuStep L4 x15 min     Lumbar Exercises: Standing   Shoulder Extension Strengthening;Both;20 reps   Shoulder Extension Limitations Pink XTS   Other Standing Lumbar Exercises B step outs 2# x20 reps each; B shoulder punches with core activation pink XTS x20 reps   Other Standing Lumbar Exercises Seated D2 with core activation 2# x20 reps each     Lumbar Exercises: Supine   Bridge 20 reps;4 seconds   Straight Leg Raise 20 reps   Other Supine Lumbar Exercises Ball to lap 2# x20 reps   Other Supine Lumbar Exercises D2 2# BUE with core x20 reps     Knee/Hip Exercises: Machines for Strengthening   Cybex Knee Extension 20# 3x10 reps with core activation   Cybex Knee Flexion 40# 3x10 reps     Knee/Hip Exercises: Sidelying   Hip ABduction AROM;Both;2 sets;10 reps                  PT Short Term  Goals - 08/19/16 0813      PT SHORT TERM GOAL #1   Title Pt will be independent in her initial HEP   Time 3   Period Weeks   Status Achieved           PT Long Term Goals - 08/26/16 0840      PT LONG TERM GOAL #1   Title Pt will be independent in her HEP and progression   Time 6   Period Weeks   Status On-going     PT LONG TERM GOAL #2   Title Pt will improve bilateral LE hip strength to 5/5 in order to improve functional mobility.    Baseline -4 to 4/5   Time 6   Period Weeks   Status On-going     PT LONG TERM GOAL #3   Title Pt will be able to improve her FOTO score from 49% limitation to </= 42% limitation   Time 6   Period Weeks   Status On-going     PT LONG TERM GOAL #4   Title Pt will be able to amb up and down a flight of stairs using single hand rail with pain less than/= 2/10.    Time 6   Period Weeks   Status Achieved  Achieved 08/26/2016 with no pain per patient report               Plan - 08/28/16 9798    Clinical Impression Statement Patient  tolerated today's treatment well with no complaints of any increased pain. Patient reported only one occurance of LLE weakness to point of feeling as if it were buckling was following all supine and seated exercises per patient report. Patient guided through various new core and lumbar exercises with minimal to moderate multimodal cueing for proper technique. BLE strength measured as 4+/5- 5/5 throughout.   Rehab Potential Good   PT Frequency 2x / week   PT Duration 6 weeks   PT Treatment/Interventions ADLs/Self Care Home Management;Electrical Stimulation;Iontophoresis 4mg /ml Dexamethasone;Functional mobility training;Stair training;Gait training;Moist Heat;Traction;Therapeutic activities;Therapeutic exercise;Patient/family education;Passive range of motion;Manual techniques;Dry needling   PT Next Visit Plan Continue with LE and core strengthening per MPT POC.   PT Home Exercise Plan TA/breathing/pelvic floor engagement; SDLY QL and ITB stretch; piriformis strech, bridging   Consulted and Agree with Plan of Care Patient      Patient will benefit from skilled therapeutic intervention in order to improve the following deficits and impairments:  Postural dysfunction, Decreased mobility, Decreased strength, Pain, Difficulty walking, Decreased activity tolerance, Impaired flexibility  Visit Diagnosis: Chronic left-sided low back pain with sciatica, sciatica laterality unspecified  Difficulty in walking, not elsewhere classified  Muscle weakness (generalized)     Problem List Patient Active Problem List   Diagnosis Date Noted  . Trochanteric bursitis of left hip 12/11/2015  . Fatty infiltration of liver 12/30/2014  . Abnormal liver enzymes 12/30/2014  . Statin intolerance 06/30/2014  . Cholelithiasis 06/30/2014  . Left knee pain 06/30/2014  . Prediabetes 06/30/2014  . Hypothyroidism 12/01/2010  . Hyperlipidemia 12/01/2010  . Osteopenia 12/01/2010  . Allergic rhinitis 12/01/2010  .  Allergic bronchitis 12/01/2010  . GE reflux 12/01/2010    Wynelle Fanny, PTA 08/28/2016, 8:58 AM  [facility] [address] [place], [place], [address] Phone: [phone]   Fax:  [phone]  Name: Olivia Bender MRN: [id number] Date of Birth: Sep 13, 1950

## 2016-09-02 ENCOUNTER — Ambulatory Visit: Payer: Medicare Other | Attending: Internal Medicine | Admitting: Physical Therapy

## 2016-09-02 DIAGNOSIS — M5440 Lumbago with sciatica, unspecified side: Secondary | ICD-10-CM | POA: Insufficient documentation

## 2016-09-02 DIAGNOSIS — R262 Difficulty in walking, not elsewhere classified: Secondary | ICD-10-CM | POA: Insufficient documentation

## 2016-09-02 DIAGNOSIS — M6281 Muscle weakness (generalized): Secondary | ICD-10-CM | POA: Diagnosis not present

## 2016-09-02 DIAGNOSIS — G8929 Other chronic pain: Secondary | ICD-10-CM | POA: Insufficient documentation

## 2016-09-02 NOTE — Therapy (Signed)
[facility] [place], [place], [address] Phone: [phone]   Fax:  [phone]  Physical Therapy Treatment  Patient Details  Name: Olivia Bender MRN: [id number] Date of Birth: November 30, 1950 Referring Provider: Emeline General, MD  Encounter Date: 09/02/2016      PT End of Session - 09/02/16 0855    Visit Number 9   Number of Visits 12   PT Start Time 0815   PT Stop Time 0855   PT Time Calculation (min) 40 min   Activity Tolerance Patient tolerated treatment well   Behavior During Therapy [facility] for tasks assessed/performed      Past Medical History:  Diagnosis Date  . Allergy   . Anxiety   . Arthritis   . Constipation   . Generalized headaches   . Hyperlipidemia   . Osteopenia   . Thyroid disease    hypothyroidism    Past Surgical History:  Procedure Laterality Date  . NASAL SINUS SURGERY  1986    There were no vitals filed for this visit.      Subjective Assessment - 09/02/16 0815    Subjective no pain; carried heavy bag (>10#) and aggravated back.  took about 24 hours to resolve.   Limitations House hold activities;Standing;Walking   Patient Stated Goals Stop hurting, be able to take care of my husband who just had a CABG surgery   Currently in Pain? No/denies                         [facility] Adult PT Treatment/Exercise - 09/02/16 0817      Lumbar Exercises: Aerobic   Stationary Bike NuStep L5 x15 min     Lumbar Exercises: Machines for Strengthening   Cybex Knee Extension 20# 3x10   Cybex Knee Flexion 30# 3x10 reps     Lumbar Exercises: Supine   Bridge 10 reps;5 seconds   Bridge Limitations with red theraband maintaining neutral hip   Straight Leg Raise 10 reps   Straight Leg Raises Limitations 2# bil     Knee/Hip Exercises: Standing   Hip Flexion Both;2 sets;10 reps;Knee bent   Hip Flexion Limitations 3#; cues for core activation   Hip Abduction Both;2 sets;10 reps;Knee  straight   Abduction Limitations 3#; cues for core activation   Hip Extension Both;2 sets;10 reps;Knee straight   Extension Limitations 3#; cues for core activation     Knee/Hip Exercises: Seated   Sit to Sand 20 reps;without UE support                  PT Short Term Goals - 08/19/16 0813      PT SHORT TERM GOAL #1   Title Pt will be independent in her initial HEP   Time 3   Period Weeks   Status Achieved           PT Long Term Goals - 08/26/16 0840      PT LONG TERM GOAL #1   Title Pt will be independent in her HEP and progression   Time 6   Period Weeks   Status On-going     PT LONG TERM GOAL #2   Title Pt will improve bilateral LE hip strength to 5/5 in order to improve functional mobility.    Baseline -4 to 4/5   Time 6   Period Weeks   Status On-going     PT LONG TERM GOAL #3   Title Pt will  be able to improve her FOTO score from 49% limitation to </= 42% limitation   Time 6   Period Weeks   Status On-going     PT LONG TERM GOAL #4   Title Pt will be able to amb up and down a flight of stairs using single hand rail with pain less than/= 2/10.    Time 6   Period Weeks   Status Achieved  Achieved 08/26/2016 with no pain per patient report               Plan - 09/02/16 1962    Clinical Impression Statement Pt tolerated all strengthening exercises well today without increase in pain, but with expected muscle fatigue at end of session.  Will continue to benefit from PT to maximize function.  Progressing towards goals.   PT Treatment/Interventions ADLs/Self Care Home Management;Electrical Stimulation;Iontophoresis 4mg /ml Dexamethasone;Functional mobility training;Stair training;Gait training;Moist Heat;Traction;Therapeutic activities;Therapeutic exercise;Patient/family education;Passive range of motion;Manual techniques;Dry needling   PT Next Visit Plan Continue with LE and core strengthening per MPT POC., gcode and progress note due   PT Home  Exercise Plan TA/breathing/pelvic floor engagement; SDLY QL and ITB stretch; piriformis strech, bridging      Patient will benefit from skilled therapeutic intervention in order to improve the following deficits and impairments:  Postural dysfunction, Decreased mobility, Decreased strength, Pain, Difficulty walking, Decreased activity tolerance, Impaired flexibility  Visit Diagnosis: Chronic left-sided low back pain with sciatica, sciatica laterality unspecified  Difficulty in walking, not elsewhere classified  Muscle weakness (generalized)     Problem List Patient Active Problem List   Diagnosis Date Noted  . Trochanteric bursitis of left hip 12/11/2015  . Fatty infiltration of liver 12/30/2014  . Abnormal liver enzymes 12/30/2014  . Statin intolerance 06/30/2014  . Cholelithiasis 06/30/2014  . Left knee pain 06/30/2014  . Prediabetes 06/30/2014  . Hypothyroidism 12/01/2010  . Hyperlipidemia 12/01/2010  . Osteopenia 12/01/2010  . Allergic rhinitis 12/01/2010  . Allergic bronchitis 12/01/2010  . GE reflux 12/01/2010       Laureen Abrahams, PT, DPT 09/02/16 8:57 AM    [facility] [address] [place], [place], [address] Phone: [phone]   Fax:  [phone]  Name: Olivia Bender MRN: [id number] Date of Birth: 1950/11/16

## 2016-09-04 ENCOUNTER — Ambulatory Visit: Payer: Medicare Other | Admitting: Physical Therapy

## 2016-09-04 DIAGNOSIS — M6281 Muscle weakness (generalized): Secondary | ICD-10-CM

## 2016-09-04 DIAGNOSIS — M5440 Lumbago with sciatica, unspecified side: Principal | ICD-10-CM

## 2016-09-04 DIAGNOSIS — R262 Difficulty in walking, not elsewhere classified: Secondary | ICD-10-CM

## 2016-09-04 DIAGNOSIS — G8929 Other chronic pain: Secondary | ICD-10-CM | POA: Diagnosis not present

## 2016-09-04 NOTE — Therapy (Addendum)
[facility] [place], [place], [address] Phone: [phone]   Fax:  [phone]  Physical Therapy Treatment  Patient Details  Name: Olivia Bender MRN: [id number] Date of Birth: 01-Apr-1951 Referring Provider: Emeline General, MD  Encounter Date: 09/04/2016      PT End of Session - 09/04/16 0855    Visit Number 10   Number of Visits 12   PT Start Time 0815   PT Stop Time 3329   PT Time Calculation (min) 39 min   Activity Tolerance Patient tolerated treatment well   Behavior During Therapy [facility] for tasks assessed/performed      Past Medical History:  Diagnosis Date  . Allergy   . Anxiety   . Arthritis   . Constipation   . Generalized headaches   . Hyperlipidemia   . Osteopenia   . Thyroid disease    hypothyroidism    Past Surgical History:  Procedure Laterality Date  . NASAL SINUS SURGERY  1986    There were no vitals filed for this visit.      Subjective Assessment - 09/04/16 0817    Subjective having a little stinging in Lt low back going down leg a little bit.    Patient Stated Goals Stop hurting, be able to take care of my husband who just had a CABG surgery   Currently in Pain? Yes   Pain Score 4    Pain Location Back   Pain Orientation Lower   Pain Descriptors / Indicators --  stinging   Pain Type Chronic pain   Pain Onset More than a month ago   Pain Frequency Constant   Aggravating Factors  lifting, some exercises   Pain Relieving Factors lying down with feet propped up, heat                         [facility] Adult PT Treatment/Exercise - 09/04/16 0818      Lumbar Exercises: Aerobic   Stationary Bike NuStep L5 x15 min     Lumbar Exercises: Supine   Ab Set 10 reps;5 seconds   Clam 10 reps   Clam Limitations bil; alternating   Bridge 20 reps;5 seconds   Large Ball Oblique Isometric Limitations trunk rotation with ball x 20 bil   Other Supine Lumbar Exercises isometric hip  extension 20x5 sec hold bil alternating     Knee/Hip Exercises: Standing   Heel Raises Both;20 reps   Heel Raises Limitations toe raises x 20 reps   Lateral Step Up Both;10 reps;Step Height: 6";Hand Hold: 1   Forward Step Up Both;10 reps;Step Height: 6";Hand Hold: 1                  PT Short Term Goals - 08/19/16 0813      PT SHORT TERM GOAL #1   Title Pt will be independent in her initial HEP   Time 3   Period Weeks   Status Achieved           PT Long Term Goals - 08/26/16 0840      PT LONG TERM GOAL #1   Title Pt will be independent in her HEP and progression   Time 6   Period Weeks   Status On-going     PT LONG TERM GOAL #2   Title Pt will improve bilateral LE hip strength to 5/5 in order to improve functional mobility.    Baseline -4 to 4/5  Time 6   Period Weeks   Status On-going     PT LONG TERM GOAL #3   Title Pt will be able to improve her FOTO score from 49% limitation to </= 42% limitation   Time 6   Period Weeks   Status On-going     PT LONG TERM GOAL #4   Title Pt will be able to amb up and down a flight of stairs using single hand rail with pain less than/= 2/10.    Time 6   Period Weeks   Status Achieved  Achieved 08/26/2016 with no pain per patient report               Plan - September 25, 2016 0240    Clinical Impression Statement Pt with increased soreness after last session but tolerated session well today with improved pain at end of session.  Declined modalities today.  Will continue to benefit from PT to maximize function.  Plan for renewal v/s d/c next week.   PT Treatment/Interventions ADLs/Self Care Home Management;Electrical Stimulation;Iontophoresis 4mg /ml Dexamethasone;Functional mobility training;Stair training;Gait training;Moist Heat;Traction;Therapeutic activities;Therapeutic exercise;Patient/family education;Passive range of motion;Manual techniques;Dry needling   PT Next Visit Plan Continue with LE and core strengthening  per MPT POC. will need renewal v/s d/c   PT Home Exercise Plan TA/breathing/pelvic floor engagement; SDLY QL and ITB stretch; piriformis strech, bridging   Consulted and Agree with Plan of Care Patient      Patient will benefit from skilled therapeutic intervention in order to improve the following deficits and impairments:  Postural dysfunction, Decreased mobility, Decreased strength, Pain, Difficulty walking, Decreased activity tolerance, Impaired flexibility  Visit Diagnosis: Chronic left-sided low back pain with sciatica, sciatica laterality unspecified  Difficulty in walking, not elsewhere classified  Muscle weakness (generalized)       G-Codes - 25-Sep-2016 0857    Functional Assessment Tool Used (Outpatient Only) clinical judgement   Functional Limitation Mobility: Walking and moving around   Mobility: Walking and Moving Around Current Status ([id number]) At least 40 percent but less than 60 percent impaired, limited or restricted   Mobility: Walking and Moving Around Goal Status [phone]) At least 40 percent but less than 60 percent impaired, limited or restricted      Problem List Patient Active Problem List   Diagnosis Date Noted  . Trochanteric bursitis of left hip 12/11/2015  . Fatty infiltration of liver 12/30/2014  . Abnormal liver enzymes 12/30/2014  . Statin intolerance 06/30/2014  . Cholelithiasis 06/30/2014  . Left knee pain 06/30/2014  . Prediabetes 06/30/2014  . Hypothyroidism 12/01/2010  . Hyperlipidemia 12/01/2010  . Osteopenia 12/01/2010  . Allergic rhinitis 12/01/2010  . Allergic bronchitis 12/01/2010  . GE reflux 12/01/2010      Laureen Abrahams, PT, DPT 09-25-16 8:58 AM    [facility] [address] [place], [place], [address] Phone: [phone]   Fax:  [phone]  Name: Olivia Bender MRN: [id number] Date of Birth: 25-Jul-1950      Physical Therapy Progress Note  Dates of Reporting Period:  07/31/16 to Sep 25, 2016  Objective Reports of Subjective Statement: see above  Objective Measurements: see above  Goal Update: see above  Plan: continue per POC, may extend PT depending on progress next week  Reason Skilled Services are Required: to maximize function and decrease pain   Laureen Abrahams, PT, DPT 09-25-2016 9:45 AM  [facility] [place], [place] [address]  [phone] (office) [phone] (fax)

## 2016-09-09 ENCOUNTER — Encounter: Payer: Self-pay | Admitting: Physical Therapy

## 2016-09-09 ENCOUNTER — Ambulatory Visit: Payer: Medicare Other | Admitting: Physical Therapy

## 2016-09-09 DIAGNOSIS — R262 Difficulty in walking, not elsewhere classified: Secondary | ICD-10-CM

## 2016-09-09 DIAGNOSIS — M6281 Muscle weakness (generalized): Secondary | ICD-10-CM | POA: Diagnosis not present

## 2016-09-09 DIAGNOSIS — M5440 Lumbago with sciatica, unspecified side: Principal | ICD-10-CM

## 2016-09-09 DIAGNOSIS — G8929 Other chronic pain: Secondary | ICD-10-CM | POA: Diagnosis not present

## 2016-09-09 NOTE — Therapy (Signed)
[facility] [place], [place], [address] Phone: [phone]   Fax:  [phone]  Physical Therapy Treatment  Patient Details  Name: Olivia Bender MRN: [id number] Date of Birth: 1950/10/14 Referring Provider: Emeline General, MD  Encounter Date: 09/09/2016      PT End of Session - 09/09/16 0851    Visit Number 11   Number of Visits 12   PT Start Time 0814   PT Stop Time 0854   PT Time Calculation (min) 40 min   Activity Tolerance Patient tolerated treatment well   Behavior During Therapy [facility] for tasks assessed/performed      Past Medical History:  Diagnosis Date  . Allergy   . Anxiety   . Arthritis   . Constipation   . Generalized headaches   . Hyperlipidemia   . Osteopenia   . Thyroid disease    hypothyroidism    Past Surgical History:  Procedure Laterality Date  . NASAL SINUS SURGERY  1986    There were no vitals filed for this visit.      Subjective Assessment - 09/09/16 0829    Subjective Patient reported ongoing symptoms in left LE   Limitations House hold activities;Standing;Walking   Patient Stated Goals Stop hurting, be able to take care of my husband who just had a CABG surgery   Currently in Pain? Yes   Pain Score 4    Pain Location Back   Pain Orientation Lower   Pain Descriptors / Indicators Aching   Pain Type Chronic pain   Pain Radiating Towards left LE   Pain Onset More than a month ago   Pain Frequency Constant   Aggravating Factors  lifting and prolong activity   Pain Relieving Factors at rest            [facility] PT Assessment - 09/09/16 0001      Strength   Right/Left Hip Right;Left   Right Hip ADduction 4+/5   Left Hip ABduction 4/5                     [facility] Adult PT Treatment/Exercise - 09/09/16 0001      Lumbar Exercises: Aerobic   Stationary Bike NuStep L5 x15 min     Lumbar Exercises: Standing   Row Strengthening;Both;20 reps   Row Limitations  Pink XTS   Shoulder Extension Strengthening;Both;20 reps   Shoulder Extension Limitations Pink XTS      Lumbar Exercises: Supine   Bent Knee Raise 20 reps;3 seconds   Bridge 20 reps;5 seconds   Other Supine Lumbar Exercises hip abd with red t-band x30 left LE focus     Lumbar Exercises: Sidelying   Hip Abduction 20 reps;3 seconds  bil LE     Knee/Hip Exercises: Standing   Lateral Step Up Left;2 sets;10 reps;Hand Hold: 1;Step Height: 6"   Forward Step Up Left;2 sets;10 reps;Hand Hold: 1;Step Height: 6"                  PT Short Term Goals - 08/19/16 0813      PT SHORT TERM GOAL #1   Title Pt will be independent in her initial HEP   Time 3   Period Weeks   Status Achieved           PT Long Term Goals - 09/09/16 3354      PT LONG TERM GOAL #1   Title Pt will be independent in her HEP and progression  Time 6   Period Weeks   Status Achieved     PT LONG TERM GOAL #2   Title Pt will improve bilateral LE hip strength to 5/5 in order to improve functional mobility.    Baseline -4 to 4/5   Time 6   Period Weeks   Status On-going     PT LONG TERM GOAL #3   Title Pt will be able to improve her FOTO score from 49% limitation to </= 42% limitation   Time 6   Period Weeks   Status On-going     PT LONG TERM GOAL #4   Title Pt will be able to amb up and down a flight of stairs using single hand rail with pain less than/= 2/10.    Time 6   Period Weeks   Status Achieved               Plan - 09/09/16 0859    Clinical Impression Statement Patient tolerated treatment well yet has ongoing Left LE symptoms. Patient reported 75% improvement overall. Patient has ongoing left hip strength defict. Patient understands HEP's and independent with all activities. Patient would like to continue therapy to regain strength and function and would also like to return to MD to have an MRI. Patient met LTG#1 today and remaining goal ongoing due to strength defict in left LE  and symptoms in left LE limitations.   Rehab Potential Good   PT Frequency 2x / week   PT Duration 6 weeks   PT Treatment/Interventions ADLs/Self Care Home Management;Electrical Stimulation;Iontophoresis 50m/ml Dexamethasone;Functional mobility training;Stair training;Gait training;Moist Heat;Traction;Therapeutic activities;Therapeutic exercise;Patient/family education;Passive range of motion;Manual techniques;Dry needling   PT Next Visit Plan Continue with LE and core strengthening per MPT POC. send follow up for renewal and awaiting MD signature   Consulted and Agree with Plan of Care Patient      Patient will benefit from skilled therapeutic intervention in order to improve the following deficits and impairments:  Postural dysfunction, Decreased mobility, Decreased strength, Pain, Difficulty walking, Decreased activity tolerance, Impaired flexibility  Visit Diagnosis: Chronic left-sided low back pain with sciatica, sciatica laterality unspecified  Difficulty in walking, not elsewhere classified  Muscle weakness (generalized)     Problem List Patient Active Problem List   Diagnosis Date Noted  . Trochanteric bursitis of left hip 12/11/2015  . Fatty infiltration of liver 12/30/2014  . Abnormal liver enzymes 12/30/2014  . Statin intolerance 06/30/2014  . Cholelithiasis 06/30/2014  . Left knee pain 06/30/2014  . Prediabetes 06/30/2014  . Hypothyroidism 12/01/2010  . Hyperlipidemia 12/01/2010  . Osteopenia 12/01/2010  . Allergic rhinitis 12/01/2010  . Allergic bronchitis 12/01/2010  . GE reflux 12/01/2010    CLadean Raya PTA 09/09/16 9:09 AM  [facility] [address] [address]Phone: [phone]  Fax:  [phone] Name: Olivia CANDLERMRN: [id number] of Birth: 105-07-1950

## 2016-09-11 ENCOUNTER — Ambulatory Visit: Payer: Medicare Other

## 2016-09-11 DIAGNOSIS — G8929 Other chronic pain: Secondary | ICD-10-CM | POA: Diagnosis not present

## 2016-09-11 DIAGNOSIS — R262 Difficulty in walking, not elsewhere classified: Secondary | ICD-10-CM

## 2016-09-11 DIAGNOSIS — M5440 Lumbago with sciatica, unspecified side: Principal | ICD-10-CM

## 2016-09-11 DIAGNOSIS — M6281 Muscle weakness (generalized): Secondary | ICD-10-CM

## 2016-09-11 NOTE — Therapy (Signed)
[facility] [place], [place], [address] Phone: [phone]   Fax:  [phone]  Physical Therapy Treatment  Patient Details  Name: Olivia Bender MRN: [id number] Date of Birth: [birth year] Referring Provider: Emeline General, MD  Encounter Date: 09/11/2016      PT End of Session - 09/11/16 5277    Visit Number 12   Number of Visits 18   PT Start Time 0815   PT Stop Time 0859   PT Time Calculation (min) 44 min      Past Medical History:  Diagnosis Date  . Allergy   . Anxiety   . Arthritis   . Constipation   . Generalized headaches   . Hyperlipidemia   . Osteopenia   . Thyroid disease    hypothyroidism    Past Surgical History:  Procedure Laterality Date  . NASAL SINUS SURGERY  1986    There were no vitals filed for this visit.      Subjective Assessment - 09/11/16 0827    Subjective Pt. reporting L LE pain is still present and worst while carry stuff up stairs.     Patient Stated Goals Stop hurting, be able to take care of my husband who just had a CABG surgery   Currently in Pain? Yes   Pain Score 2    Pain Location Back   Pain Orientation Lower   Pain Descriptors / Indicators Aching   Pain Type Chronic pain   Pain Radiating Towards Mild pain into L foot    Pain Onset More than a month ago   Pain Frequency Constant   Multiple Pain Sites No                         [facility] Adult PT Treatment/Exercise - 09/11/16 0824      Lumbar Exercises: Aerobic   Stationary Bike NuStep L5 x15 min     Lumbar Exercises: Standing   Row Strengthening;Both;20 reps;Theraband   Theraband Level (Row) Level 3 (Green)   Row Limitations staggered stance    Shoulder Extension Strengthening;Both;20 reps   Theraband Level (Shoulder Extension) Level 2 (Red)   Shoulder Extension Limitations cues for scap. retraction   Other Standing Lumbar Exercises B Standing pallof press with red TB x 15 reps     Lumbar Exercises: Supine   Bridge 20 reps;5 seconds   Bridge Limitations with green TB sustained hip abduction/ER at knees      Knee/Hip Exercises: Standing   Lateral Step Up Left;2 sets;Hand Hold: 1;Step Height: 6";15 reps   Forward Step Up Left;2 sets;Hand Hold: 1;Step Height: 6";15 reps     Knee/Hip Exercises: Sidelying   Hip ABduction AROM;Both;1 set;15 reps     Knee/Hip Exercises: Prone   Hip Extension AROM;1 set;Left;10 reps                  PT Short Term Goals - 08/19/16 0813      PT SHORT TERM GOAL #1   Title Pt will be independent in her initial HEP   Time 3   Period Weeks   Status Achieved           PT Long Term Goals - 09/09/16 8242      PT LONG TERM GOAL #1   Title Pt will be independent in her HEP and progression   Time 6   Period Weeks   Status Achieved     PT LONG TERM GOAL #  2   Title Pt will improve bilateral LE hip strength to 5/5 in order to improve functional mobility.    Baseline -4 to 4/5   Time 6   Period Weeks   Status On-going     PT LONG TERM GOAL #3   Title Pt will be able to improve her FOTO score from 49% limitation to </= 42% limitation   Time 6   Period Weeks   Status On-going     PT LONG TERM GOAL #4   Title Pt will be able to amb up and down a flight of stairs using single hand rail with pain less than/= 2/10.    Time 6   Period Weeks   Status Achieved               Plan - 09/11/16 2919    Clinical Impression Statement Pt. performing well with all lumbopelvic strengthening activity today.  Reps progressed with stepping activity and introduction to seated march on P-ball.  LBP remained unchanged throughout treatment with only minor pain in L LE.  Pt. still feels she is improving with therapy able to climb stairs while carrying stuff with greater ease with less frequent pain.     PT Treatment/Interventions ADLs/Self Care Home Management;Electrical Stimulation;Iontophoresis 4mg /ml Dexamethasone;Functional  mobility training;Stair training;Gait training;Moist Heat;Traction;Therapeutic activities;Therapeutic exercise;Patient/family education;Passive range of motion;Manual techniques;Dry needling   PT Next Visit Plan Continue with LE and core strengthening per MPT POC. send follow up for renewal and awaiting MD signature   PT Home Exercise Plan TA/breathing/pelvic floor engagement; SDLY QL and ITB stretch; piriformis stretch, bridging      Patient will benefit from skilled therapeutic intervention in order to improve the following deficits and impairments:  Postural dysfunction, Decreased mobility, Decreased strength, Pain, Difficulty walking, Decreased activity tolerance, Impaired flexibility  Visit Diagnosis: Chronic left-sided low back pain with sciatica, sciatica laterality unspecified  Difficulty in walking, not elsewhere classified  Muscle weakness (generalized)     Problem List Patient Active Problem List   Diagnosis Date Noted  . Trochanteric bursitis of left hip 12/11/2015  . Fatty infiltration of liver 12/30/2014  . Abnormal liver enzymes 12/30/2014  . Statin intolerance 06/30/2014  . Cholelithiasis 06/30/2014  . Left knee pain 06/30/2014  . Prediabetes 06/30/2014  . Hypothyroidism 12/01/2010  . Hyperlipidemia 12/01/2010  . Osteopenia 12/01/2010  . Allergic rhinitis 12/01/2010  . Allergic bronchitis 12/01/2010  . GE reflux 12/01/2010    Bess Harvest, PTA 09/11/16 12:40 PM   [facility] [place], [place], [address] Phone: [phone]   Fax:  [phone]  Name: Olivia Bender MRN: [id number] Date of Birth: 09/27/1950

## 2016-09-12 ENCOUNTER — Encounter: Payer: Self-pay | Admitting: Internal Medicine

## 2016-09-12 ENCOUNTER — Ambulatory Visit (INDEPENDENT_AMBULATORY_CARE_PROVIDER_SITE_OTHER): Payer: Medicare Other | Admitting: Internal Medicine

## 2016-09-12 VITALS — BP 140/82 | HR 89 | Temp 97.5°F | Ht 60.0 in | Wt 202.2 lb

## 2016-09-12 DIAGNOSIS — M7062 Trochanteric bursitis, left hip: Secondary | ICD-10-CM | POA: Diagnosis not present

## 2016-09-12 MED ORDER — METHYLPREDNISOLONE ACETATE 80 MG/ML IJ SUSP
80.0000 mg | Freq: Once | INTRAMUSCULAR | Status: AC
  Administered 2016-09-12: 80 mg via INTRAMUSCULAR

## 2016-09-12 NOTE — Progress Notes (Signed)
   Subjective:    Patient ID: Olivia Bender, female    DOB: 1950-07-04, 66 y.o.   MRN: [id number]  HPI For follow-up of left hip and leg pain. Have reviewed physical therapy notes that indicate she has made considerabl eprogress with physical therapy over several weeks. Has had considerable strengthening in her upper leg. Previously had injection in left hip by orthopedist.  She has tenderness over left greater trochanter now.  Her husband recently had cardiac bypass surgery by Dr. Roxy Manns. She's been under some stress. Remains overweight.    Review of Systemssee above     Objective:   Physical Exam Straight leg raising is negative at 90 on the left. Muscle strength is normal in the left lower extremity 5 over 5 in all groups tested.Tender over left trochanteric bursa       Assessment & Plan:  Left trochanteric bursitis  Plan: Injection of Depo-Medrol 1 mL, Marcaine 1 mL, Xylocaine 1 mL into left trochanteric bursa. Patient tolerated injection well. No complications. She is to continue with physical therapy.

## 2016-09-12 NOTE — Addendum Note (Signed)
Addended by: Inocencio Homes on: 09/12/2016 12:57 PM   Modules accepted: Orders

## 2016-09-12 NOTE — Patient Instructions (Signed)
Injection of 1 mL Depo-Medrol, 1 mL Xylocaine, 1 mL Marcaine into  Left trochanteric bursa. Continue physical therapy.

## 2016-09-18 ENCOUNTER — Encounter: Payer: Self-pay | Admitting: Physical Therapy

## 2016-09-18 ENCOUNTER — Ambulatory Visit: Payer: Medicare Other | Admitting: Physical Therapy

## 2016-09-18 DIAGNOSIS — G8929 Other chronic pain: Secondary | ICD-10-CM

## 2016-09-18 DIAGNOSIS — M6281 Muscle weakness (generalized): Secondary | ICD-10-CM | POA: Diagnosis not present

## 2016-09-18 DIAGNOSIS — M5440 Lumbago with sciatica, unspecified side: Principal | ICD-10-CM

## 2016-09-18 DIAGNOSIS — R262 Difficulty in walking, not elsewhere classified: Secondary | ICD-10-CM | POA: Diagnosis not present

## 2016-09-18 NOTE — Therapy (Signed)
[facility] [place], [place], [address] Phone: [phone]   Fax:  [phone]  Physical Therapy Treatment  Patient Details  Name: Olivia Bender MRN: [id number] Date of Birth: 02-01-1951 Referring Provider: Emeline General, MD  Encounter Date: 09/18/2016      PT End of Session - 09/18/16 0830    Visit Number 13   Number of Visits 18   Date for PT Re-Evaluation 09/29/16   PT Start Time 0817   PT Stop Time 0900   PT Time Calculation (min) 43 min   Activity Tolerance Patient tolerated treatment well   Behavior During Therapy [facility] for tasks assessed/performed      Past Medical History:  Diagnosis Date  . Allergy   . Anxiety   . Arthritis   . Constipation   . Generalized headaches   . Hyperlipidemia   . Osteopenia   . Thyroid disease    hypothyroidism    Past Surgical History:  Procedure Laterality Date  . NASAL SINUS SURGERY  1986    There were no vitals filed for this visit.      Subjective Assessment - 09/18/16 0830    Subjective Reports that she recieved a shot in her L hip when she went to MD appointment and that she also got new mattress too.   Limitations House hold activities;Standing;Walking   Patient Stated Goals Stop hurting, be able to take care of my husband who just had a CABG surgery   Currently in Pain? No/denies            [facility] PT Assessment - 09/18/16 0001      Assessment   Medical Diagnosis left low back pain   Hand Dominance Right   Next MD Visit 12/2016   Prior Therapy on left shoulder a few years ago     Precautions   Precautions None                     [facility] Adult PT Treatment/Exercise - 09/18/16 0001      Lumbar Exercises: Aerobic   Stationary Bike NuStep L5 x15 min     Knee/Hip Exercises: Machines for Strengthening   Cybex Knee Extension 10# 3x10 reps   Cybex Knee Flexion 30# 3x10 reps     Knee/Hip Exercises: Standing   Lateral Step Up Left;3  sets;10 reps;Hand Hold: 0;Step Height: 6"   Forward Step Up Left;3 sets;10 reps;Hand Hold: 0;Step Height: 6"     Knee/Hip Exercises: Supine   Bridges with Clamshell Strengthening;Both;3 sets;10 reps  with green theraband   Straight Leg Raises Strengthening;Both;3 sets;10 reps   Straight Leg Raises Limitations with core activation     Knee/Hip Exercises: Sidelying   Hip ABduction Strengthening;Both;3 sets;10 reps     Knee/Hip Exercises: Prone   Hip Extension AROM;Both;2 sets;10 reps                  PT Short Term Goals - 08/19/16 0813      PT SHORT TERM GOAL #1   Title Pt will be independent in her initial HEP   Time 3   Period Weeks   Status Achieved           PT Long Term Goals - 09/09/16 7322      PT LONG TERM GOAL #1   Title Pt will be independent in her HEP and progression   Time 6   Period Weeks   Status Achieved  PT LONG TERM GOAL #2   Title Pt will improve bilateral LE hip strength to 5/5 in order to improve functional mobility.    Baseline -4 to 4/5   Time 6   Period Weeks   Status On-going     PT LONG TERM GOAL #3   Title Pt will be able to improve her FOTO score from 49% limitation to </= 42% limitation   Time 6   Period Weeks   Status On-going     PT LONG TERM GOAL #4   Title Pt will be able to amb up and down a flight of stairs using single hand rail with pain less than/= 2/10.    Time 6   Period Weeks   Status Achieved               Plan - 09/18/16 0902    Clinical Impression Statement Patient continues to tolerate treatment well as she arrived with no low back or L hip. Patient had only complaints of fatigue and perspiration with exercises today. No reports of L hip soreness or discomfort with any exercises today. Hip exercises were completed in effort to achieve hip strength goal.      Rehab Potential Good   PT Frequency 2x / week   PT Duration 6 weeks   PT Treatment/Interventions ADLs/Self Care Home  Management;Electrical Stimulation;Iontophoresis 4mg /ml Dexamethasone;Functional mobility training;Stair training;Gait training;Moist Heat;Traction;Therapeutic activities;Therapeutic exercise;Patient/family education;Passive range of motion;Manual techniques;Dry needling   PT Next Visit Plan Continue with LE and core strengthening per MPT POC. send follow up for renewal and awaiting MD signature   PT Home Exercise Plan TA/breathing/pelvic floor engagement; SDLY QL and ITB stretch; piriformis stretch, bridging   Consulted and Agree with Plan of Care Patient      Patient will benefit from skilled therapeutic intervention in order to improve the following deficits and impairments:  Postural dysfunction, Decreased mobility, Decreased strength, Pain, Difficulty walking, Decreased activity tolerance, Impaired flexibility  Visit Diagnosis: Chronic left-sided low back pain with sciatica, sciatica laterality unspecified  Difficulty in walking, not elsewhere classified  Muscle weakness (generalized)     Problem List Patient Active Problem List   Diagnosis Date Noted  . Trochanteric bursitis of left hip 12/11/2015  . Fatty infiltration of liver 12/30/2014  . Abnormal liver enzymes 12/30/2014  . Statin intolerance 06/30/2014  . Cholelithiasis 06/30/2014  . Left knee pain 06/30/2014  . Prediabetes 06/30/2014  . Hypothyroidism 12/01/2010  . Hyperlipidemia 12/01/2010  . Osteopenia 12/01/2010  . Allergic rhinitis 12/01/2010  . Allergic bronchitis 12/01/2010  . GE reflux 12/01/2010    Wynelle Fanny, PTA 09/18/2016, 9:10 AM  [facility] [address] [place], [place], [address] Phone: [phone]   Fax:  [phone]  Name: ANTOINETTE BORGWARDT MRN: [id number] Date of Birth: 1951/05/21

## 2016-09-19 ENCOUNTER — Encounter: Payer: Self-pay | Admitting: Physical Therapy

## 2016-09-19 ENCOUNTER — Ambulatory Visit: Payer: Medicare Other | Admitting: Physical Therapy

## 2016-09-19 DIAGNOSIS — R262 Difficulty in walking, not elsewhere classified: Secondary | ICD-10-CM

## 2016-09-19 DIAGNOSIS — M5440 Lumbago with sciatica, unspecified side: Secondary | ICD-10-CM | POA: Diagnosis not present

## 2016-09-19 DIAGNOSIS — G8929 Other chronic pain: Secondary | ICD-10-CM | POA: Diagnosis not present

## 2016-09-19 DIAGNOSIS — M6281 Muscle weakness (generalized): Secondary | ICD-10-CM | POA: Diagnosis not present

## 2016-09-19 NOTE — Therapy (Signed)
[facility] [place], [place], [address] Phone: [phone]   Fax:  [phone]  Physical Therapy Treatment  Patient Details  Name: Olivia Bender MRN: [id number] Date of Birth: [birth year] Referring Provider: Emeline General, MD  Encounter Date: 09/19/2016      PT End of Session - 09/19/16 0820    Visit Number 14   Number of Visits 18   Date for PT Re-Evaluation 09/29/16   PT Start Time 0817   PT Stop Time 0859   PT Time Calculation (min) 42 min   Activity Tolerance Patient tolerated treatment well   Behavior During Therapy [facility] for tasks assessed/performed      Past Medical History:  Diagnosis Date  . Allergy   . Anxiety   . Arthritis   . Constipation   . Generalized headaches   . Hyperlipidemia   . Osteopenia   . Thyroid disease    hypothyroidism    Past Surgical History:  Procedure Laterality Date  . NASAL SINUS SURGERY  1986    There were no vitals filed for this visit.      Subjective Assessment - 09/19/16 0819    Subjective Reports that she picked up a 32 count of water that she shouldn't have but her hip is okay.   Limitations House hold activities;Standing;Walking   Patient Stated Goals Stop hurting, be able to take care of my husband who just had a CABG surgery   Currently in Pain? No/denies            [facility] PT Assessment - 09/19/16 0001      Assessment   Medical Diagnosis left low back pain   Hand Dominance Right   Next MD Visit 12/2016   Prior Therapy on left shoulder a few years ago     Precautions   Precautions None                     [facility] Adult PT Treatment/Exercise - 09/19/16 0001      Lumbar Exercises: Aerobic   Stationary Bike NuStep L6 x15 min     Lumbar Exercises: Standing   Row Strengthening;Both;20 reps;Theraband   Row Limitations staggered stance with pink XTS     Lumbar Exercises: Supine   Bent Knee Raise Other (comment)  3x10 reps with green  theraband     Lumbar Exercises: Sidelying   Clam 20 reps;Other (comment)  BLE green theraband   Hip Abduction 20 reps     Knee/Hip Exercises: Machines for Strengthening   Cybex Knee Extension 20# 3x10 reps   Cybex Knee Flexion 40# 3x10 reps     Knee/Hip Exercises: Standing   Forward Step Up Both;2 sets;10 reps;Hand Hold: 2;Step Height: 8"     Knee/Hip Exercises: Seated   Sit to Sand 20 reps;without UE support  in staggered stance     Knee/Hip Exercises: Supine   Bridges with Clamshell Strengthening;Both;3 sets;10 reps  with green theraband   Straight Leg Raises Strengthening;Both;3 sets;10 reps   Straight Leg Raises Limitations with core activation                  PT Short Term Goals - 08/19/16 0813      PT SHORT TERM GOAL #1   Title Pt will be independent in her initial HEP   Time 3   Period Weeks   Status Achieved           PT Long Term Goals - 09/09/16  0852      PT LONG TERM GOAL #1   Title Pt will be independent in her HEP and progression   Time 6   Period Weeks   Status Achieved     PT LONG TERM GOAL #2   Title Pt will improve bilateral LE hip strength to 5/5 in order to improve functional mobility.    Baseline -4 to 4/5   Time 6   Period Weeks   Status On-going     PT LONG TERM GOAL #3   Title Pt will be able to improve her FOTO score from 49% limitation to </= 42% limitation   Time 6   Period Weeks   Status On-going     PT LONG TERM GOAL #4   Title Pt will be able to amb up and down a flight of stairs using single hand rail with pain less than/= 2/10.    Time 6   Period Weeks   Status Achieved               Plan - 09/19/16 0859    Clinical Impression Statement Patient continues to do very well in treatment with only complaints of fatigue with exercises. Able to tolerate resistance and repititions without complaint from patient. Able to complete more activities without L hip or LBP pain per patient report.         Rehab  Potential Good   PT Frequency 2x / week   PT Duration 6 weeks   PT Treatment/Interventions ADLs/Self Care Home Management;Electrical Stimulation;Iontophoresis 4mg /ml Dexamethasone;Functional mobility training;Stair training;Gait training;Moist Heat;Traction;Therapeutic activities;Therapeutic exercise;Patient/family education;Passive range of motion;Manual techniques;Dry needling   PT Next Visit Plan Continue with LE and core strengthening per MPT POC. send follow up for renewal and awaiting MD signature   PT Home Exercise Plan TA/breathing/pelvic floor engagement; SDLY QL and ITB stretch; piriformis stretch, bridging   Consulted and Agree with Plan of Care Patient      Patient will benefit from skilled therapeutic intervention in order to improve the following deficits and impairments:  Postural dysfunction, Decreased mobility, Decreased strength, Pain, Difficulty walking, Decreased activity tolerance, Impaired flexibility  Visit Diagnosis: Chronic left-sided low back pain with sciatica, sciatica laterality unspecified  Difficulty in walking, not elsewhere classified  Muscle weakness (generalized)     Problem List Patient Active Problem List   Diagnosis Date Noted  . Trochanteric bursitis of left hip 12/11/2015  . Fatty infiltration of liver 12/30/2014  . Abnormal liver enzymes 12/30/2014  . Statin intolerance 06/30/2014  . Cholelithiasis 06/30/2014  . Left knee pain 06/30/2014  . Prediabetes 06/30/2014  . Hypothyroidism 12/01/2010  . Hyperlipidemia 12/01/2010  . Osteopenia 12/01/2010  . Allergic rhinitis 12/01/2010  . Allergic bronchitis 12/01/2010  . GE reflux 12/01/2010    Wynelle Fanny, PTA 09/19/2016, 9:06 AM  [facility] [address] [place], [place], [address] Phone: [phone]   Fax:  [phone]  Name: Olivia Bender MRN: [id number] Date of Birth: 10-17-1950

## 2016-09-23 ENCOUNTER — Ambulatory Visit: Payer: Medicare Other | Admitting: Physical Therapy

## 2016-09-23 ENCOUNTER — Encounter: Payer: Self-pay | Admitting: Physical Therapy

## 2016-09-23 DIAGNOSIS — R262 Difficulty in walking, not elsewhere classified: Secondary | ICD-10-CM

## 2016-09-23 DIAGNOSIS — G8929 Other chronic pain: Secondary | ICD-10-CM | POA: Diagnosis not present

## 2016-09-23 DIAGNOSIS — M6281 Muscle weakness (generalized): Secondary | ICD-10-CM

## 2016-09-23 DIAGNOSIS — M5440 Lumbago with sciatica, unspecified side: Secondary | ICD-10-CM | POA: Diagnosis not present

## 2016-09-23 NOTE — Therapy (Signed)
[facility] [place], [place], [address] Phone: [phone]   Fax:  [phone]  Physical Therapy Treatment  Patient Details  Name: Olivia Bender MRN: [id number] Date of Birth: [birth year] Referring Provider: Emeline General, MD  Encounter Date: 09/23/2016      PT End of Session - 09/23/16 0840    Visit Number 15   Number of Visits 18   Date for PT Re-Evaluation 09/29/16   PT Start Time 0814   PT Stop Time 0900   PT Time Calculation (min) 46 min   Activity Tolerance Patient tolerated treatment well   Behavior During Therapy [facility] for tasks assessed/performed      Past Medical History:  Diagnosis Date  . Allergy   . Anxiety   . Arthritis   . Constipation   . Generalized headaches   . Hyperlipidemia   . Osteopenia   . Thyroid disease    hypothyroidism    Past Surgical History:  Procedure Laterality Date  . NASAL SINUS SURGERY  1986    There were no vitals filed for this visit.      Subjective Assessment - 09/23/16 0817    Subjective Patient arrived with no pain today and feeling good today with overall improvement   Limitations House hold activities;Standing;Walking   Patient Stated Goals Stop hurting, be able to take care of my husband who just had a CABG surgery   Currently in Pain? No/denies                         [facility] Adult PT Treatment/Exercise - 09/23/16 0001      Lumbar Exercises: Aerobic   Stationary Bike NuStep L6 x15 min     Lumbar Exercises: Standing   Row Strengthening;Both;20 reps;Theraband  pink XTS   Shoulder Extension Strengthening;Both;20 reps  pink XTS     Lumbar Exercises: Supine   Bent Knee Raise 3 seconds  2x20   Straight Leg Raise 3 seconds  2x10   Other Supine Lumbar Exercises hip abd with greent-band x30 left LE focus     Lumbar Exercises: Sidelying   Clam 20 reps  bil   Hip Abduction 20 reps  bil     Knee/Hip Exercises: Machines for  Strengthening   Cybex Knee Extension 20# 3x10 reps   Cybex Knee Flexion 40# 3x10 reps     Knee/Hip Exercises: Standing   Forward Step Up Both;2 sets;10 reps;Hand Hold: 2;Step Height: 6"   Rocker Board 3 minutes  core focus                  PT Short Term Goals - 08/19/16 0813      PT SHORT TERM GOAL #1   Title Pt will be independent in her initial HEP   Time 3   Period Weeks   Status Achieved           PT Long Term Goals - 09/09/16 7416      PT LONG TERM GOAL #1   Title Pt will be independent in her HEP and progression   Time 6   Period Weeks   Status Achieved     PT LONG TERM GOAL #2   Title Pt will improve bilateral LE hip strength to 5/5 in order to improve functional mobility.    Baseline -4 to 4/5   Time 6   Period Weeks   Status On-going     PT LONG TERM GOAL #3  Title Pt will be able to improve her FOTO score from 49% limitation to </= 42% limitation   Time 6   Period Weeks   Status On-going     PT LONG TERM GOAL #4   Title Pt will be able to amb up and down a flight of stairs using single hand rail with pain less than/= 2/10.    Time 6   Period Weeks   Status Achieved               Plan - 09/23/16 0841    Clinical Impression Statement Patient tolerated treatment well today. Patient has reported no pain and feels 80% improvement overall. Patient progressing with strengthening core and LE with no complaints or difficulty. Patient progressing toward remaining goals. strength defict remain.   Rehab Potential Good   PT Frequency 2x / week   PT Duration 6 weeks   PT Treatment/Interventions ADLs/Self Care Home Management;Electrical Stimulation;Iontophoresis 4mg /ml Dexamethasone;Functional mobility training;Stair training;Gait training;Moist Heat;Traction;Therapeutic activities;Therapeutic exercise;Patient/family education;Passive range of motion;Manual techniques;Dry needling   PT Next Visit Plan Continue with LE and core strengthening per  MPT POC   Consulted and Agree with Plan of Care Patient      Patient will benefit from skilled therapeutic intervention in order to improve the following deficits and impairments:  Postural dysfunction, Decreased mobility, Decreased strength, Pain, Difficulty walking, Decreased activity tolerance, Impaired flexibility  Visit Diagnosis: Difficulty in walking, not elsewhere classified  Chronic left-sided low back pain with sciatica, sciatica laterality unspecified  Muscle weakness (generalized)     Problem List Patient Active Problem List   Diagnosis Date Noted  . Trochanteric bursitis of left hip 12/11/2015  . Fatty infiltration of liver 12/30/2014  . Abnormal liver enzymes 12/30/2014  . Statin intolerance 06/30/2014  . Cholelithiasis 06/30/2014  . Left knee pain 06/30/2014  . Prediabetes 06/30/2014  . Hypothyroidism 12/01/2010  . Hyperlipidemia 12/01/2010  . Osteopenia 12/01/2010  . Allergic rhinitis 12/01/2010  . Allergic bronchitis 12/01/2010  . GE reflux 12/01/2010    Phillips Climes, PTA 09/23/2016, 9:08 AM  [facility] [place], [place], [address] Phone: [phone]   Fax:  [phone]  Name: ENIYAH EASTMOND MRN: [id number] Date of Birth: [birth year]

## 2016-09-27 ENCOUNTER — Ambulatory Visit: Payer: Medicare Other | Admitting: *Deleted

## 2016-09-27 DIAGNOSIS — M5440 Lumbago with sciatica, unspecified side: Secondary | ICD-10-CM

## 2016-09-27 DIAGNOSIS — G8929 Other chronic pain: Secondary | ICD-10-CM | POA: Diagnosis not present

## 2016-09-27 DIAGNOSIS — M6281 Muscle weakness (generalized): Secondary | ICD-10-CM

## 2016-09-27 DIAGNOSIS — R262 Difficulty in walking, not elsewhere classified: Secondary | ICD-10-CM

## 2016-09-27 NOTE — Therapy (Signed)
[facility] [place], [place], [address] Phone: [phone]   Fax:  [phone]  Physical Therapy Treatment  Patient Details  Name: Olivia Bender MRN: [id number] Date of Birth: 12-21-1950 Referring Provider: Emeline General, MD  Encounter Date: 09/27/2016      PT End of Session - 09/27/16 0815    Visit Number 16   Number of Visits 18   Date for PT Re-Evaluation 09/29/16   PT Start Time 0815   PT Stop Time 0901   PT Time Calculation (min) 46 min      Past Medical History:  Diagnosis Date  . Allergy   . Anxiety   . Arthritis   . Constipation   . Generalized headaches   . Hyperlipidemia   . Osteopenia   . Thyroid disease    hypothyroidism    Past Surgical History:  Procedure Laterality Date  . NASAL SINUS SURGERY  1986    There were no vitals filed for this visit.      Subjective Assessment - 09/27/16 0847    Subjective Patient arrived with no pain today and feeling good today with 90%  overall improvement   Limitations House hold activities;Standing;Walking   Patient Stated Goals Stop hurting, be able to take care of my husband who just had a CABG surgery   Currently in Pain? No/denies                         [facility] Adult PT Treatment/Exercise - 09/27/16 0001      Lumbar Exercises: Aerobic   Stationary Bike NuStep L6 x15 min     Lumbar Exercises: Standing   Row Strengthening;Both;20 reps;Theraband;10 reps  pink XTS   Shoulder Extension Strengthening;Both;20 reps;10 reps  pink XTS     Lumbar Exercises: Supine   Ab Set 10 reps;5 seconds   Bent Knee Raise 3 seconds  2x20   Bridge 20 reps;5 seconds   Straight Leg Raise 3 seconds  2x10     Lumbar Exercises: Sidelying   Clam 20 reps  bil   Hip Abduction 20 reps  bil     Knee/Hip Exercises: Machines for Strengthening   Cybex Knee Extension --   Cybex Knee Flexion --     Knee/Hip Exercises: Land --                   PT Short Term Goals - 08/19/16 0813      PT SHORT TERM GOAL #1   Title Pt will be independent in her initial HEP   Time 3   Period Weeks   Status Achieved           PT Long Term Goals - 09/09/16 9758      PT LONG TERM GOAL #1   Title Pt will be independent in her HEP and progression   Time 6   Period Weeks   Status Achieved     PT LONG TERM GOAL #2   Title Pt will improve bilateral LE hip strength to 5/5 in order to improve functional mobility.    Baseline -4 to 4/5   Time 6   Period Weeks   Status On-going     PT LONG TERM GOAL #3   Title Pt will be able to improve her FOTO score from 49% limitation to </= 42% limitation   Time 6   Period Weeks   Status On-going  PT LONG TERM GOAL #4   Title Pt will be able to amb up and down a flight of stairs using single hand rail with pain less than/= 2/10.    Time 6   Period Weeks   Status Achieved               Plan - 09/27/16 0857    Clinical Impression Statement Pt arrived to clinic doing well with minimal LBP and mild LT hip discomfort. She was able to perform therex for core and hip strengthening with mainly fatigue end of Rx. Her LT hip abduction strength was 4/5 and close to meeting LTG. She was issued SLR and ABD. for HEP.  No LTGs met today and are ongoing.   Rehab Potential Good   PT Frequency 2x / week   PT Duration 6 weeks   PT Next Visit Plan Continue with LE and core strengthening per MPT POC. 2 visits left and DC to HEP   PT Home Exercise Plan TA/breathing/pelvic floor engagement; SDLY QL and ITB stretch; piriformis stretch, bridging   Consulted and Agree with Plan of Care Patient      Patient will benefit from skilled therapeutic intervention in order to improve the following deficits and impairments:  Postural dysfunction, Decreased mobility, Decreased strength, Pain, Difficulty walking, Decreased activity tolerance, Impaired flexibility  Visit Diagnosis: Difficulty  in walking, not elsewhere classified  Chronic left-sided low back pain with sciatica, sciatica laterality unspecified  Muscle weakness (generalized)     Problem List Patient Active Problem List   Diagnosis Date Noted  . Trochanteric bursitis of left hip 12/11/2015  . Fatty infiltration of liver 12/30/2014  . Abnormal liver enzymes 12/30/2014  . Statin intolerance 06/30/2014  . Cholelithiasis 06/30/2014  . Left knee pain 06/30/2014  . Prediabetes 06/30/2014  . Hypothyroidism 12/01/2010  . Hyperlipidemia 12/01/2010  . Osteopenia 12/01/2010  . Allergic rhinitis 12/01/2010  . Allergic bronchitis 12/01/2010  . GE reflux 12/01/2010    Blakelynn Scheeler,CHRIS, PTA 09/27/2016, 9:47 AM  [facility] [place], [place], [address] Phone: [phone]   Fax:  [phone]  Name: Olivia Bender MRN: [id number] Date of Birth: 18-Aug-1950

## 2016-09-30 ENCOUNTER — Ambulatory Visit: Payer: Medicare Other | Admitting: Physical Therapy

## 2016-09-30 ENCOUNTER — Encounter: Payer: Self-pay | Admitting: Physical Therapy

## 2016-09-30 DIAGNOSIS — M5440 Lumbago with sciatica, unspecified side: Secondary | ICD-10-CM

## 2016-09-30 DIAGNOSIS — M6281 Muscle weakness (generalized): Secondary | ICD-10-CM

## 2016-09-30 DIAGNOSIS — R262 Difficulty in walking, not elsewhere classified: Secondary | ICD-10-CM

## 2016-09-30 DIAGNOSIS — G8929 Other chronic pain: Secondary | ICD-10-CM

## 2016-09-30 NOTE — Therapy (Signed)
[facility] [place], [place], [address] Phone: [phone]   Fax:  [phone]  Physical Therapy Treatment  Patient Details  Name: Olivia Bender MRN: [id number] Date of Birth: 11-06-1950 Referring Provider: ``  Encounter Date: 09/30/2016      PT End of Session - 09/30/16 0817    Visit Number 17   Number of Visits 18   Date for PT Re-Evaluation 09/29/16   PT Start Time 0816   PT Stop Time 0859   PT Time Calculation (min) 43 min   Activity Tolerance Patient tolerated treatment well   Behavior During Therapy [facility] for tasks assessed/performed      Past Medical History:  Diagnosis Date  . Allergy   . Anxiety   . Arthritis   . Constipation   . Generalized headaches   . Hyperlipidemia   . Osteopenia   . Thyroid disease    hypothyroidism    Past Surgical History:  Procedure Laterality Date  . NASAL SINUS SURGERY  1986    There were no vitals filed for this visit.      Subjective Assessment - 09/30/16 0816    Subjective Reports soreness in low level L hip soreness from cleaning up yard this weekend.   Limitations House hold activities;Standing;Walking   Patient Stated Goals Stop hurting, be able to take care of my husband who just had a CABG surgery   Currently in Pain? Yes   Pain Score 2    Pain Location Hip   Pain Orientation Left   Pain Descriptors / Indicators Sore   Pain Type Chronic pain   Pain Onset More than a month ago            [facility] PT Assessment - 09/30/16 0001      Assessment   Medical Diagnosis left low back pain   Referring Provider ``   Hand Dominance Right   Next MD Visit 12/2016   Prior Therapy on left shoulder a few years ago     Precautions   Precautions None                     [facility] Adult PT Treatment/Exercise - 09/30/16 0001      Lumbar Exercises: Aerobic   Stationary Bike NuStep L6 x15 min     Lumbar Exercises: Standing   Row Strengthening;Both;20  reps;Theraband;10 reps  Pink XTS   Shoulder Extension Strengthening;Both;20 reps;10 reps  Pink XTS     Lumbar Exercises: Supine   Bent Knee Raise Other (comment)  3x10 reps with 2# ankle weights   Straight Leg Raise Other (comment)  3x10 reps   Straight Leg Raises Limitations BLE     Lumbar Exercises: Sidelying   Clam 20 reps   Clam Limitations BLE with green theraband   Hip Abduction Other (comment)  3x10 reps     Knee/Hip Exercises: Machines for Strengthening   Cybex Knee Extension 20# 3x10 reps   Cybex Knee Flexion 40# 3x10 reps     Knee/Hip Exercises: Supine   Bridges with Ball Squeeze Strengthening;Both;2 sets;10 reps     Knee/Hip Exercises: Prone   Hip Extension AROM;Both;2 sets;10 reps                  PT Short Term Goals - 08/19/16 0813      PT SHORT TERM GOAL #1   Title Pt will be independent in her initial HEP   Time 3   Period Weeks  Status Achieved           PT Long Term Goals - 09/09/16 5681      PT LONG TERM GOAL #1   Title Pt will be independent in her HEP and progression   Time 6   Period Weeks   Status Achieved     PT LONG TERM GOAL #2   Title Pt will improve bilateral LE hip strength to 5/5 in order to improve functional mobility.    Baseline -4 to 4/5   Time 6   Period Weeks   Status On-going     PT LONG TERM GOAL #3   Title Pt will be able to improve her FOTO score from 49% limitation to </= 42% limitation   Time 6   Period Weeks   Status On-going     PT LONG TERM GOAL #4   Title Pt will be able to amb up and down a flight of stairs using single hand rail with pain less than/= 2/10.    Time 6   Period Weeks   Status Achieved               Plan - 09/30/16 0936    Clinical Impression Statement Patient arrived to clinic with reports of low level L hip soreness from activity over the weekend. Patient able to complete all exercises well with only fatigue reported by patient. Core activation continued through  today's treatment even with focus on hip strengthening today.   Rehab Potential Good   PT Frequency 2x / week   PT Duration 6 weeks   PT Treatment/Interventions ADLs/Self Care Home Management;Electrical Stimulation;Iontophoresis 4mg /ml Dexamethasone;Functional mobility training;Stair training;Gait training;Moist Heat;Traction;Therapeutic activities;Therapeutic exercise;Patient/family education;Passive range of motion;Manual techniques;Dry needling   PT Next Visit Plan D/C summary and final FOTO required next treatment.   PT Home Exercise Plan TA/breathing/pelvic floor engagement; SDLY QL and ITB stretch; piriformis stretch, bridging   Consulted and Agree with Plan of Care Patient      Patient will benefit from skilled therapeutic intervention in order to improve the following deficits and impairments:  Postural dysfunction, Decreased mobility, Decreased strength, Pain, Difficulty walking, Decreased activity tolerance, Impaired flexibility  Visit Diagnosis: Difficulty in walking, not elsewhere classified  Chronic left-sided low back pain with sciatica, sciatica laterality unspecified  Muscle weakness (generalized)     Problem List Patient Active Problem List   Diagnosis Date Noted  . Trochanteric bursitis of left hip 12/11/2015  . Fatty infiltration of liver 12/30/2014  . Abnormal liver enzymes 12/30/2014  . Statin intolerance 06/30/2014  . Cholelithiasis 06/30/2014  . Left knee pain 06/30/2014  . Prediabetes 06/30/2014  . Hypothyroidism 12/01/2010  . Hyperlipidemia 12/01/2010  . Osteopenia 12/01/2010  . Allergic rhinitis 12/01/2010  . Allergic bronchitis 12/01/2010  . GE reflux 12/01/2010    Wynelle Fanny, PTA 09/30/2016, 9:38 AM  [facility] [address] [place], [place], [address] Phone: [phone]   Fax:  [phone]  Name: Olivia Bender MRN: [id number] Date of Birth: 1951/03/14

## 2016-10-01 ENCOUNTER — Ambulatory Visit: Payer: Medicare Other | Attending: Internal Medicine | Admitting: Physical Therapy

## 2016-10-01 DIAGNOSIS — R262 Difficulty in walking, not elsewhere classified: Secondary | ICD-10-CM | POA: Diagnosis not present

## 2016-10-01 DIAGNOSIS — G8929 Other chronic pain: Secondary | ICD-10-CM | POA: Insufficient documentation

## 2016-10-01 DIAGNOSIS — M6281 Muscle weakness (generalized): Secondary | ICD-10-CM | POA: Insufficient documentation

## 2016-10-01 DIAGNOSIS — M5440 Lumbago with sciatica, unspecified side: Secondary | ICD-10-CM | POA: Diagnosis not present

## 2016-10-01 NOTE — Therapy (Signed)
[facility] [place], [place], [address] Phone: [phone]   Fax:  [phone]  Physical Therapy Treatment/Discharge  Patient Details  Name: Olivia Bender MRN: [id number] Date of Birth: 06/02/1951 Referring Provider: ``  Encounter Date: 10/01/2016      PT End of Session - 10/01/16 0848    Visit Number 18   PT Start Time 0815   PT Stop Time 0848   PT Time Calculation (min) 33 min   Activity Tolerance Patient tolerated treatment well   Behavior During Therapy [facility] for tasks assessed/performed      Past Medical History:  Diagnosis Date  . Allergy   . Anxiety   . Arthritis   . Constipation   . Generalized headaches   . Hyperlipidemia   . Osteopenia   . Thyroid disease    hypothyroidism    Past Surgical History:  Procedure Laterality Date  . NASAL SINUS SURGERY  1986    There were no vitals filed for this visit.      Subjective Assessment - 10/01/16 0815    Subjective doing well, ready for discharge today.   Patient Stated Goals Stop hurting, be able to take care of my husband who just had a CABG surgery   Currently in Pain? No/denies            [facility] PT Assessment - 10/01/16 2409      Observation/Other Assessments   Focus on Therapeutic Outcomes (FOTO)  62 (38% limited)     Strength   Right Hip Flexion 5/5   Right Hip ABduction 5/5   Left Hip Flexion 4+/5   Left Hip ABduction 5/5   Right Knee Flexion 5/5   Right Knee Extension 5/5   Left Knee Flexion 5/5   Left Knee Extension 5/5                     [facility] Adult PT Treatment/Exercise - 10/01/16 0817      Self-Care   Self-Care Other Self-Care Comments   Other Self-Care Comments  discussed how to incorporate exercises into daily routine; added standing hip exercises to HEP and verbally reviewed.  Educated on community fitness as well.     Lumbar Exercises: Aerobic   Stationary Bike NuStep L6 x15 min                 PT Education - 10/01/16 0848    Education provided Yes   Education Details see self care   Person(s) Educated Patient   Methods Explanation;Handout   Comprehension Verbalized understanding          PT Short Term Goals - 08/19/16 0813      PT SHORT TERM GOAL #1   Title Pt will be independent in her initial HEP   Time 3   Period Weeks   Status Achieved           PT Long Term Goals - 10/01/16 7353      PT LONG TERM GOAL #1   Title Pt will be independent in her HEP and progression   Status Achieved     PT LONG TERM GOAL #2   Title Pt will improve bilateral LE hip strength to 5/5 in order to improve functional mobility.    Baseline all met except Lt hip flexion 4+/5   Status Partially Met     PT LONG TERM GOAL #3   Title Pt will be able to improve her FOTO score from 49%  limitation to </= 42% limitation   Status Achieved     PT LONG TERM GOAL #4   Title Pt will be able to amb up and down a flight of stairs using single hand rail with pain less than/= 2/10.    Status Achieved               Plan - 2016/10/24 0848    Clinical Impression Statement Pt has met all goals (except Lt hip flexion 4+/5) and is ready for d/c at this time.  Pt has home program and is considering gym program as well.     PT Treatment/Interventions ADLs/Self Care Home Management;Electrical Stimulation;Iontophoresis 53m/ml Dexamethasone;Functional mobility training;Stair training;Gait training;Moist Heat;Traction;Therapeutic activities;Therapeutic exercise;Patient/family education;Passive range of motion;Manual techniques;Dry needling   PT Next Visit Plan d/c PT today   Consulted and Agree with Plan of Care Patient      Patient will benefit from skilled therapeutic intervention in order to improve the following deficits and impairments:  Postural dysfunction, Decreased mobility, Decreased strength, Pain, Difficulty walking, Decreased activity tolerance, Impaired flexibility  Visit  Diagnosis: Difficulty in walking, not elsewhere classified  Chronic left-sided low back pain with sciatica, sciatica laterality unspecified  Muscle weakness (generalized)       G-Codes - [id number]    Functional Assessment Tool Used (Outpatient Only) clinical judgement, FOTO   Functional Limitation Mobility: Walking and moving around   Mobility: Walking and Moving Around Goal Status [phone] At least 40 percent but less than 60 percent impaired, limited or restricted   Mobility: Walking and Moving Around Discharge Status [phone] At least 20 percent but less than 40 percent impaired, limited or restricted      Problem List Patient Active Problem List   Diagnosis Date Noted  . Trochanteric bursitis of left hip 12/11/2015  . Fatty infiltration of liver 12/30/2014  . Abnormal liver enzymes 12/30/2014  . Statin intolerance 06/30/2014  . Cholelithiasis 06/30/2014  . Left knee pain 06/30/2014  . Prediabetes 06/30/2014  . Hypothyroidism 12/01/2010  . Hyperlipidemia 12/01/2010  . Osteopenia 12/01/2010  . Allergic rhinitis 12/01/2010  . Allergic bronchitis 12/01/2010  . GE reflux 12/01/2010       SLaureen Abrahams PT, DPT 005/24/20188:51 AM     [facility] [address] [place] [address]Phone: [phone]  Fax:  [phone] Name: Olivia WILZMRN: [id number] of Birth: 1March 14, 1952    PHYSICAL THERAPY DISCHARGE SUMMARY  Visits from Start of Care: 18  Current functional level related to goals / functional outcomes: See above   Remaining deficits: n/a   Education / Equipment: HEP  Plan: Patient agrees to discharge.  Patient goals were met. Patient is being discharged due to meeting the stated rehab goals.  ?????     SLaureen Abrahams PT, DPT 02018/05/248:52 AM  [facility] [address] [phone](office) [phone](fax)

## 2016-10-01 NOTE — Patient Instructions (Signed)
Standing Marching   Using a chair if necessary, march in place. Repeat __10__ times. Do _1-2___ sessions per day.  http://gt2.exer.us/344   Copyright  VHI. All rights reserved.   Hip Backward Kick   Using a chair for balance, keep legs shoulder width apart and toes pointed for- ward. Slowly extend one leg back, keeping knee straight. Do not lean forward. Repeat with other leg. Repeat __10__ times. Do _1-2___ sessions per day.  http://gt2.exer.us/340   Copyright  VHI. All rights reserved.   Hip Side Kick   Holding a chair for balance, keep legs shoulder width apart and toes pointed forward. Swing a leg out to side, keeping knee straight. Do not lean. Repeat using other leg. Repeat _10___ times. Do __1-2__ sessions per day.  http://gt2.exer.us/342   Copyright  VHI. All rights reserved.

## 2016-12-20 ENCOUNTER — Other Ambulatory Visit: Payer: Self-pay

## 2017-01-14 DIAGNOSIS — H532 Diplopia: Secondary | ICD-10-CM | POA: Diagnosis not present

## 2017-01-14 DIAGNOSIS — H10413 Chronic giant papillary conjunctivitis, bilateral: Secondary | ICD-10-CM | POA: Diagnosis not present

## 2017-01-14 DIAGNOSIS — H2513 Age-related nuclear cataract, bilateral: Secondary | ICD-10-CM | POA: Diagnosis not present

## 2017-02-24 ENCOUNTER — Other Ambulatory Visit: Payer: Self-pay | Admitting: Internal Medicine

## 2017-04-18 DIAGNOSIS — J4530 Mild persistent asthma, uncomplicated: Secondary | ICD-10-CM | POA: Diagnosis not present

## 2017-04-18 DIAGNOSIS — J310 Chronic rhinitis: Secondary | ICD-10-CM | POA: Diagnosis not present

## 2017-04-18 DIAGNOSIS — K219 Gastro-esophageal reflux disease without esophagitis: Secondary | ICD-10-CM | POA: Diagnosis not present

## 2017-04-18 DIAGNOSIS — H1045 Other chronic allergic conjunctivitis: Secondary | ICD-10-CM | POA: Diagnosis not present

## 2017-05-08 ENCOUNTER — Other Ambulatory Visit: Payer: Self-pay | Admitting: Internal Medicine

## 2017-05-08 DIAGNOSIS — M8580 Other specified disorders of bone density and structure, unspecified site: Secondary | ICD-10-CM

## 2017-05-08 DIAGNOSIS — E039 Hypothyroidism, unspecified: Secondary | ICD-10-CM

## 2017-05-08 DIAGNOSIS — R7303 Prediabetes: Secondary | ICD-10-CM

## 2017-05-08 DIAGNOSIS — Z0000 Encounter for general adult medical examination without abnormal findings: Secondary | ICD-10-CM

## 2017-05-08 DIAGNOSIS — E785 Hyperlipidemia, unspecified: Secondary | ICD-10-CM

## 2017-05-09 ENCOUNTER — Encounter: Payer: Medicare Other | Admitting: Internal Medicine

## 2017-05-15 ENCOUNTER — Other Ambulatory Visit: Payer: Self-pay | Admitting: Internal Medicine

## 2017-05-15 NOTE — Progress Notes (Signed)
ERROR

## 2017-05-22 ENCOUNTER — Encounter: Payer: Self-pay | Admitting: Internal Medicine

## 2017-05-22 ENCOUNTER — Other Ambulatory Visit (INDEPENDENT_AMBULATORY_CARE_PROVIDER_SITE_OTHER): Payer: Medicare Other | Admitting: Internal Medicine

## 2017-05-22 ENCOUNTER — Ambulatory Visit (INDEPENDENT_AMBULATORY_CARE_PROVIDER_SITE_OTHER): Payer: Medicare Other | Admitting: Internal Medicine

## 2017-05-22 VITALS — BP 130/80 | HR 76 | Ht 60.0 in | Wt 194.0 lb

## 2017-05-22 DIAGNOSIS — F439 Reaction to severe stress, unspecified: Secondary | ICD-10-CM | POA: Diagnosis not present

## 2017-05-22 DIAGNOSIS — E039 Hypothyroidism, unspecified: Secondary | ICD-10-CM

## 2017-05-22 DIAGNOSIS — R8290 Unspecified abnormal findings in urine: Secondary | ICD-10-CM | POA: Diagnosis not present

## 2017-05-22 DIAGNOSIS — K219 Gastro-esophageal reflux disease without esophagitis: Secondary | ICD-10-CM | POA: Diagnosis not present

## 2017-05-22 DIAGNOSIS — R7303 Prediabetes: Secondary | ICD-10-CM | POA: Diagnosis not present

## 2017-05-22 DIAGNOSIS — E785 Hyperlipidemia, unspecified: Secondary | ICD-10-CM

## 2017-05-22 DIAGNOSIS — M8580 Other specified disorders of bone density and structure, unspecified site: Secondary | ICD-10-CM | POA: Diagnosis not present

## 2017-05-22 DIAGNOSIS — E7800 Pure hypercholesterolemia, unspecified: Secondary | ICD-10-CM

## 2017-05-22 DIAGNOSIS — Z23 Encounter for immunization: Secondary | ICD-10-CM | POA: Diagnosis not present

## 2017-05-22 DIAGNOSIS — F411 Generalized anxiety disorder: Secondary | ICD-10-CM

## 2017-05-22 DIAGNOSIS — Z6837 Body mass index (BMI) 37.0-37.9, adult: Secondary | ICD-10-CM | POA: Diagnosis not present

## 2017-05-22 DIAGNOSIS — Z0000 Encounter for general adult medical examination without abnormal findings: Secondary | ICD-10-CM

## 2017-05-22 DIAGNOSIS — J309 Allergic rhinitis, unspecified: Secondary | ICD-10-CM | POA: Diagnosis not present

## 2017-05-22 DIAGNOSIS — Z789 Other specified health status: Secondary | ICD-10-CM | POA: Diagnosis not present

## 2017-05-22 LAB — POCT URINALYSIS DIPSTICK
Bilirubin, UA: NEGATIVE
GLUCOSE UA: NEGATIVE
Ketones, UA: NEGATIVE
NITRITE UA: NEGATIVE
PROTEIN UA: NEGATIVE
RBC UA: NEGATIVE
Spec Grav, UA: 1.01 (ref 1.010–1.025)
Urobilinogen, UA: 0.2 E.U./dL
pH, UA: 6 (ref 5.0–8.0)

## 2017-05-22 NOTE — Addendum Note (Signed)
Addended by: Mady Haagensen on: 05/22/2017 09:07 AM   Modules accepted: Orders

## 2017-05-22 NOTE — Addendum Note (Signed)
Addended by: Mady Haagensen on: 05/22/2017 09:08 AM   Modules accepted: Orders

## 2017-05-22 NOTE — Progress Notes (Signed)
Subjective:    Patient ID: Olivia Bender, female    DOB: 1950-12-17, 66 y.o.   MRN: [id number]  HPI 66 year old White Female for [employer], health maintenance exam and evaluation of medical issues. Having  hip pain and will be referred to Dr. Alvan Dame. Had PT for 9 weeks helped some but still having issues. Some throbbing in  leg at night.  To get flu vaccine today and Pneumovax 23 in about a month.  Back on Weight Watchers. Weight is 194 and was 189 last year.  She has a history of hyperlipidemia, hypothyroidism and anxiety.  History of allergic bronchitis and allergic rhinitis treated by Dr. Fredderick Phenix at [place].  She is statin intolerant despite history of hyperlipidemia.  History of hypothyroidism and obesity.  History of GE reflux and osteopenia.  History of situational stress with her husband who has been ill.  Does not tolerate tetanus immunizations because of prior reaction.  History of hand osteoarthritis.  History of frequent headaches.  Bunion left foot.  Was diagnosed with hypothyroidism in 2010.  Had colonoscopy by Dr. Collene Mares in 2006.  She took Fosamax for osteopenia for 4 years and stopped in 2010.  GYN physician is Dr. Evlyn Kanner.  She is allergic to latex.  She says Levaquin makes her constipated.  Has had a significant local reaction to tetanus toxoid and will not be repeated.  Family history: Mother died from complications of stroke and rheumatoid arthritis.  Mother and sister with history of headaches.  Mother had hypertension.  Mother also had an enlarged heart.  Father died in a motor vehicle accident with history of prostate cancer.  Social history: She is married.  Husband has many medical problems.  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  2 adult sons.  Does not smoke or consume alcohol.  One son is a Mudlogger.  Additional history: She had sinus surgery in [place].  Fractured left  foot 1985.  History of left arm fracture.  Hospitalized for chest pain with MI being ruled out in February 2007.  She had a negative Cardiolite study at that time.        Review of Systems  Cardiovascular: Negative for chest pain and leg swelling.  Genitourinary: Negative.   Musculoskeletal:       Right hip pain  Allergic/Immunologic: Positive for environmental allergies.  Psychiatric/Behavioral:       Chronically anxious       Objective:   Physical Exam  Constitutional: She is oriented to person, place, and time. She appears well-developed and well-nourished. No distress.  HENT:  Head: Normocephalic and atraumatic.  Right Ear: External ear normal.  Left Ear: External ear normal.  Mouth/Throat: Oropharynx is clear and moist.  Eyes: Conjunctivae and EOM are normal. Right eye exhibits no discharge. Left eye exhibits no discharge. No scleral icterus.  Neck: Neck supple. No JVD present. No thyromegaly present.  Cardiovascular: Normal rate, normal heart sounds and intact distal pulses.  No murmur heard. Pulmonary/Chest: She has no wheezes. She has no rales.  Breasts normal female  Abdominal: Soft. Bowel sounds are normal. She exhibits no distension and no mass. There is no tenderness. There is no rebound and no guarding.  Genitourinary:  Genitourinary Comments: Deferred to Dr. Deatra Ina GYN  Musculoskeletal: She exhibits no edema.  Lymphadenopathy:    She has no cervical adenopathy.  Neurological: She is alert and oriented to person,  place, and time. She has normal reflexes. No cranial nerve deficit. Coordination normal.  Skin: Skin is warm and dry. No rash noted. She is not diaphoretic.  Psychiatric: She has a normal mood and affect. Her behavior is normal. Judgment and thought content normal.  Anxious about hip pain  Vitals reviewed.         Assessment & Plan:  Right hip pain?  Trochanteric bursitis versus osteoarthritis.  To see Dr. Alvan Dame  Anxiety  Allergic  rhinitis  GE reflux  Statin intolerance  Hyperlipidemia-total cholesterol is 252 and triglycerides are 95.  LDL cholesterol is 184.  History of statin intolerance.  She may be a candidate for low-dose Crestor 2-3 times a week.  We will bring her back for discussion in early January  Obesity-continue diet exercise and weight loss efforts  Hypothyroidism-continue same dose of thyroid replacement  Plan: Return in April for follow-up of hyperlipidemia if she agrees to low-dose Crestor 2-3 times a week.  Subjective:   Patient presents for Medicare Annual/Subsequent preventive examination.  Review Past Medical/Family/Social: See above   Risk Factors  Current exercise habits: Not a lot of exercise with recent hip pain Dietary issues discussed: Low-fat low carbohydrate  Cardiac risk factors: Hyperlipidemia and history of stroke in mother  Depression Screen  (Note: if answer to either of the following is "Yes", a more complete depression screening is indicated)   Over the past two weeks, have you felt down, depressed or hopeless? No  Over the past two weeks, have you felt little interest or pleasure in doing things? No Have you lost interest or pleasure in daily life? No Do you often feel hopeless? No Do you cry easily over simple problems? No   Activities of Daily Living  In your present state of health, do you have any difficulty performing the following activities?:   Driving? No  Managing money? No  Feeding yourself? No  Getting from bed to chair? No  Climbing a flight of stairs? No  Preparing food and eating?: No  Bathing or showering? No  Getting dressed: No  Getting to the toilet? No  Using the toilet:No  Moving around from place to place: No  In the past year have you fallen or had a near fall?:No  Are you sexually active? No  Do you have more than one partner? No   Hearing Difficulties: No  Do you often ask people to speak up or repeat themselves? No  Do you  experience ringing or noises in your ears?  Yes Do you have difficulty understanding soft or whispered voices? No  Do you feel that you have a problem with memory? No Do you often misplace items? No    Home Safety:  Do you have a smoke alarm at your residence? Yes Do you have grab bars in the bathroom?  No Do you have throw rugs in your house?  Yes   Cognitive Testing  Alert? Yes Normal Appearance?Yes  Oriented to person? Yes Place? Yes  Time? Yes  Recall of three objects? Yes  Can perform simple calculations? Yes  Displays appropriate judgment?Yes  Can read the correct time from a watch face?Yes   List the Names of Other Physician/Practitioners you currently use:  See referral list for the physicians patient is currently seeing.     Review of Systems: See above   Objective:     General appearance: Appears stated age and  obese  Head: Normocephalic, without obvious abnormality, atraumatic  Eyes: conj  clear, EOMi PEERLA  Ears: normal TM's and external ear canals both ears  Nose: Nares normal. Septum midline. Mucosa normal. No drainage or sinus tenderness.  Throat: lips, mucosa, and tongue normal; teeth and gums normal  Neck: no adenopathy, no carotid bruit, no JVD, supple, symmetrical, trachea midline and thyroid not enlarged, symmetric, no tenderness/mass/nodules  No CVA tenderness.  Lungs: clear to auscultation bilaterally  Breasts: normal appearance, no masses or tenderness.  Heart: regular rate and rhythm, S1, S2 normal, no murmur, click, rub or gallop  Abdomen: soft, non-tender; bowel sounds normal; no masses, no organomegaly  Musculoskeletal: ROM normal in all joints, no crepitus, no deformity, Normal muscle strengthen. Back  is symmetric, no curvature. Skin: Skin color, texture, turgor normal. No rashes or lesions  Lymph nodes: Cervical, supraclavicular, and axillary nodes normal.  Neurologic: CN 2 -12 Normal, Normal symmetric reflexes. Normal coordination and  gait  Psych: Alert & Oriented x 3, Mood appear stable.    Assessment:    Annual wellness medicare exam   Plan:    During the course of the visit the patient was educated and counseled about appropriate screening and preventive services including:        Patient Instructions (the written plan) was given to the patient.  Medicare Attestation  I have personally reviewed:  The patient's medical and social history  Their use of alcohol, tobacco or illicit drugs  Their current medications and supplements  The patient's functional ability including ADLs,fall risks, home safety risks, cognitive, and hearing and visual impairment  Diet and physical activities  Evidence for depression or mood disorders  The patient's weight, height, BMI, and visual acuity have been recorded in the chart. I have made referrals, counseling, and provided education to the patient based on review of the above and I have provided the patient with a written personalized care plan for preventive services.

## 2017-05-23 LAB — COMPLETE METABOLIC PANEL WITH GFR
AG Ratio: 1.6 (calc) (ref 1.0–2.5)
ALBUMIN MSPROF: 4.1 g/dL (ref 3.6–5.1)
ALKALINE PHOSPHATASE (APISO): 66 U/L (ref 33–130)
ALT: 17 U/L (ref 6–29)
AST: 24 U/L (ref 10–35)
BUN: 15 mg/dL (ref 7–25)
CO2: 25 mmol/L (ref 20–32)
CREATININE: 0.75 mg/dL (ref 0.50–0.99)
Calcium: 9.4 mg/dL (ref 8.6–10.4)
Chloride: 106 mmol/L (ref 98–110)
GFR, EST AFRICAN AMERICAN: 96 mL/min/{1.73_m2} (ref >=60)
GFR, Est Non African American: 82 mL/min/{1.73_m2} (ref >=60)
GLUCOSE: 93 mg/dL (ref 65–99)
Globulin: 2.6 g/dL (calc) (ref 1.9–3.7)
Potassium: 4.2 mmol/L (ref 3.5–5.3)
Sodium: 142 mmol/L (ref 135–146)
TOTAL PROTEIN: 6.7 g/dL (ref 6.1–8.1)
Total Bilirubin: 0.5 mg/dL (ref 0.2–1.2)

## 2017-05-23 LAB — CBC WITH DIFFERENTIAL/PLATELET
BASOS PCT: 0.6 %
Basophils Absolute: 37 cells/uL (ref 0–200)
EOS ABS: 130 {cells}/uL (ref 15–500)
Eosinophils Relative: 2.1 %
HEMATOCRIT: 37.8 % (ref 35.0–45.0)
Hemoglobin: 11.9 g/dL (ref 11.7–15.5)
Lymphs Abs: 1556 cells/uL (ref 850–3900)
MCH: 29.6 pg (ref 27.0–33.0)
MCHC: 31.5 g/dL — ABNORMAL LOW (ref 32.0–36.0)
MCV: 94 fL (ref 80.0–100.0)
MPV: 12.5 fL (ref 7.5–12.5)
Monocytes Relative: 9.2 %
Neutro Abs: 3906 cells/uL (ref 1500–7800)
Neutrophils Relative %: 63 %
Platelets: 246 10*3/uL (ref 140–400)
RBC: 4.02 10*6/uL (ref 3.80–5.10)
RDW: 14.5 % (ref 11.0–15.0)
TOTAL LYMPHOCYTE: 25.1 %
WBC: 6.2 10*3/uL (ref 3.8–10.8)
WBCMIX: 570 {cells}/uL (ref 200–950)

## 2017-05-23 LAB — HEMOGLOBIN A1C
Hgb A1c MFr Bld: 5.6 % of total Hgb (ref <5.7)
Mean Plasma Glucose: 114 (calc)
eAG (mmol/L): 6.3 (calc)

## 2017-05-23 LAB — LIPID PANEL
CHOL/HDL RATIO: 5.4 (calc) — AB (ref <5.0)
Cholesterol: 252 mg/dL — ABNORMAL HIGH (ref <200)
HDL: 47 mg/dL — AB (ref >50)
LDL Cholesterol (Calc): 184 mg/dL (calc) — ABNORMAL HIGH
NON-HDL CHOLESTEROL (CALC): 205 mg/dL — AB (ref <130)
TRIGLYCERIDES: 95 mg/dL (ref <150)

## 2017-05-23 LAB — TSH: TSH: 1.27 mIU/L (ref 0.40–4.50)

## 2017-05-23 LAB — VITAMIN D 25 HYDROXY (VIT D DEFICIENCY, FRACTURES): VIT D 25 HYDROXY: 43 ng/mL (ref 30–100)

## 2017-05-24 LAB — URINE CULTURE
MICRO NUMBER: 81433798
SPECIMEN QUALITY: ADEQUATE

## 2017-06-02 ENCOUNTER — Other Ambulatory Visit: Payer: Self-pay | Admitting: Internal Medicine

## 2017-06-05 ENCOUNTER — Encounter: Payer: Self-pay | Admitting: Internal Medicine

## 2017-06-05 ENCOUNTER — Ambulatory Visit (INDEPENDENT_AMBULATORY_CARE_PROVIDER_SITE_OTHER): Payer: Medicare Other | Admitting: Internal Medicine

## 2017-06-05 VITALS — BP 120/80 | HR 79 | Ht 60.0 in | Wt 192.0 lb

## 2017-06-05 DIAGNOSIS — E7800 Pure hypercholesterolemia, unspecified: Secondary | ICD-10-CM

## 2017-06-05 DIAGNOSIS — H6501 Acute serous otitis media, right ear: Secondary | ICD-10-CM

## 2017-06-05 MED ORDER — AZITHROMYCIN 250 MG PO TABS: ORAL_TABLET | ORAL | 0 refills | Status: DC

## 2017-06-05 MED ORDER — ROSUVASTATIN CALCIUM 5 MG PO TABS: 5.0000 mg | ORAL_TABLET | Freq: Every day | ORAL | 3 refills | Status: DC

## 2017-06-05 NOTE — Progress Notes (Signed)
   Subjective:    Patient ID: CRYSTALE GIANNATTASIO, female    DOB: Jul 24, 1950, 67 y.o.   MRN: [id number]  HPI She is here to discuss lipid results.  Long-standing history of hyperlipidemia.  Says she has been statin intolerant due to muscle aches.  I have convinced her to try Crestor 2-3 times a week over the next 3 months.  She says it is not covered well with her insurance plan.  I gave her a good Rx card.  She may need to pay out-of-pocket.  I cannot imagine it is that expensive.  Total cholesterol is 252 and in January total cholesterol was 235.  LDL cholesterol is 184 and in January was 167.  If she experiences myalgias she may take coenzyme Q.  Evidently she is not able to get this under control with diet.  She is also complaining today of right ear pain and respiratory stuffiness.  No sore throat fever or chills.    Review of Systems see above     Objective:   Physical Exam  Skin warm and dry.  Nodes none.  Pharynx clear.  Right TM is full but not red.  Left TM clear      Assessment & Plan:  Right serous otitis media  Hyperlipidemia  Plan: Plan is to try Crestor 5 mg 3 times a week and follow-up in 3 months.  Zithromax Z-Pak take as directed for respiratory infection.  2 p.o. day 1 followed by 1 p.o. days 2 through 5.

## 2017-06-05 NOTE — Patient Instructions (Signed)
Crestor 5 mg 3 times a week and follow-up in 3 months.  Zithromax Z-Pak take as directed for acute right serous otitis media.

## 2017-06-28 ENCOUNTER — Encounter: Payer: Self-pay | Admitting: Internal Medicine

## 2017-06-28 NOTE — Patient Instructions (Signed)
We will bring her back for discussion regarding hyperlipidemia.  She may be a candidate for Crestor 2-3 times a week at a 5 mg dose.  She is to see Dr. Alvan Dame regarding protracted hip pain.  She needs to diet exercise and lose weight but hip pain is preventing her from exercising.

## 2017-07-01 ENCOUNTER — Other Ambulatory Visit: Payer: Self-pay | Admitting: Internal Medicine

## 2017-07-10 DIAGNOSIS — M25552 Pain in left hip: Secondary | ICD-10-CM | POA: Diagnosis not present

## 2017-07-10 DIAGNOSIS — M7062 Trochanteric bursitis, left hip: Secondary | ICD-10-CM | POA: Diagnosis not present

## 2017-08-25 ENCOUNTER — Telehealth: Payer: Self-pay | Admitting: Internal Medicine

## 2017-08-25 ENCOUNTER — Encounter: Payer: Self-pay | Admitting: Internal Medicine

## 2017-08-25 NOTE — Telephone Encounter (Signed)
error 

## 2017-08-25 NOTE — Telephone Encounter (Signed)
Patient has reqested to be seen by Dr. Garret Reddish who also sees her husband.

## 2017-08-27 ENCOUNTER — Other Ambulatory Visit: Payer: Self-pay | Admitting: Internal Medicine

## 2017-08-27 DIAGNOSIS — E785 Hyperlipidemia, unspecified: Secondary | ICD-10-CM

## 2017-09-04 ENCOUNTER — Other Ambulatory Visit: Payer: Self-pay | Admitting: Internal Medicine

## 2017-09-09 ENCOUNTER — Ambulatory Visit: Payer: Medicare Other | Admitting: Internal Medicine

## 2017-09-10 ENCOUNTER — Other Ambulatory Visit: Payer: Self-pay | Admitting: Internal Medicine

## 2017-09-11 ENCOUNTER — Other Ambulatory Visit: Payer: Self-pay

## 2017-09-11 ENCOUNTER — Telehealth: Payer: Self-pay

## 2017-09-11 MED ORDER — LEVOTHYROXINE SODIUM 75 MCG PO TABS: 75.0000 ug | ORAL_TABLET | Freq: Every day | ORAL | 0 refills | Status: DC

## 2017-09-11 NOTE — Telephone Encounter (Signed)
Copied from [place] [phone]. Topic: General - Other >> Sep 11, 2017  9:17 AM [place], Laban Emperor wrote: Reason for CRM: Larena Glassman with [facility] would like Dr. Yong Channel nurse to give her a call back. States it is about a medication.  [phone]

## 2017-09-11 NOTE — Telephone Encounter (Signed)
Spoke with daughter who stated they were not able to get Dr. Verlene Mayer office to refill the Synthroid. I called Dr. Verlene Mayer office and they stated they would give her 1 refill (90 days) that will get her to her appointment with Dr. Yong Channel. I called the patient's daughter and made her aware

## 2017-09-11 NOTE — Telephone Encounter (Signed)
Spoke w/[facility] [employer] r/t Synthroid medication.  Stated that patient will be out of her medication tomorrow per daughter.  Patient notified our office on 08/25/17 that she is transferring care to [place].  Advised we will send refill for patient.  They will notify patient/daughter.  Araceli will send refill to pharmacy.

## 2017-09-18 DIAGNOSIS — Z124 Encounter for screening for malignant neoplasm of cervix: Secondary | ICD-10-CM | POA: Diagnosis not present

## 2017-09-30 DIAGNOSIS — Z1231 Encounter for screening mammogram for malignant neoplasm of breast: Secondary | ICD-10-CM | POA: Diagnosis not present

## 2017-09-30 LAB — HM MAMMOGRAPHY

## 2017-10-02 ENCOUNTER — Encounter: Payer: Self-pay | Admitting: Internal Medicine

## 2017-11-03 ENCOUNTER — Ambulatory Visit: Payer: Medicare Other | Admitting: Family Medicine

## 2017-11-03 DIAGNOSIS — J310 Chronic rhinitis: Secondary | ICD-10-CM | POA: Diagnosis not present

## 2017-11-03 DIAGNOSIS — H1045 Other chronic allergic conjunctivitis: Secondary | ICD-10-CM | POA: Diagnosis not present

## 2017-11-03 DIAGNOSIS — K219 Gastro-esophageal reflux disease without esophagitis: Secondary | ICD-10-CM | POA: Diagnosis not present

## 2017-11-03 DIAGNOSIS — J4530 Mild persistent asthma, uncomplicated: Secondary | ICD-10-CM | POA: Diagnosis not present

## 2017-11-21 ENCOUNTER — Encounter: Payer: Self-pay | Admitting: Family Medicine

## 2017-11-21 ENCOUNTER — Ambulatory Visit: Payer: Medicare Other | Admitting: Internal Medicine

## 2017-11-21 ENCOUNTER — Ambulatory Visit (INDEPENDENT_AMBULATORY_CARE_PROVIDER_SITE_OTHER): Payer: Medicare Other | Admitting: Family Medicine

## 2017-11-21 ENCOUNTER — Other Ambulatory Visit: Payer: Medicare Other | Admitting: Internal Medicine

## 2017-11-21 VITALS — BP 122/82 | HR 74 | Temp 98.0°F | Ht 60.0 in | Wt 172.8 lb

## 2017-11-21 DIAGNOSIS — K760 Fatty (change of) liver, not elsewhere classified: Secondary | ICD-10-CM | POA: Diagnosis not present

## 2017-11-21 DIAGNOSIS — J4520 Mild intermittent asthma, uncomplicated: Secondary | ICD-10-CM

## 2017-11-21 DIAGNOSIS — E039 Hypothyroidism, unspecified: Secondary | ICD-10-CM | POA: Diagnosis not present

## 2017-11-21 DIAGNOSIS — J301 Allergic rhinitis due to pollen: Secondary | ICD-10-CM

## 2017-11-21 DIAGNOSIS — J45909 Unspecified asthma, uncomplicated: Secondary | ICD-10-CM | POA: Insufficient documentation

## 2017-11-21 DIAGNOSIS — M8580 Other specified disorders of bone density and structure, unspecified site: Secondary | ICD-10-CM | POA: Diagnosis not present

## 2017-11-21 DIAGNOSIS — R51 Headache: Secondary | ICD-10-CM

## 2017-11-21 DIAGNOSIS — Z23 Encounter for immunization: Secondary | ICD-10-CM | POA: Diagnosis not present

## 2017-11-21 DIAGNOSIS — R519 Headache, unspecified: Secondary | ICD-10-CM

## 2017-11-21 DIAGNOSIS — E785 Hyperlipidemia, unspecified: Secondary | ICD-10-CM | POA: Diagnosis not present

## 2017-11-21 DIAGNOSIS — K219 Gastro-esophageal reflux disease without esophagitis: Secondary | ICD-10-CM

## 2017-11-21 LAB — COMPREHENSIVE METABOLIC PANEL
ALT: 17 U/L (ref 0–35)
AST: 24 U/L (ref 0–37)
Albumin: 4.3 g/dL (ref 3.5–5.2)
Alkaline Phosphatase: 67 U/L (ref 39–117)
BUN: 18 mg/dL (ref 6–23)
CHLORIDE: 104 meq/L (ref 96–112)
CO2: 28 meq/L (ref 19–32)
Calcium: 9.6 mg/dL (ref 8.4–10.5)
Creatinine, Ser: 0.75 mg/dL (ref 0.40–1.20)
GFR: 81.79 mL/min (ref >60.00)
Glucose, Bld: 89 mg/dL (ref 70–99)
Potassium: 4.3 mEq/L (ref 3.5–5.1)
SODIUM: 139 meq/L (ref 135–145)
Total Bilirubin: 0.5 mg/dL (ref 0.2–1.2)
Total Protein: 7.7 g/dL (ref 6.0–8.3)

## 2017-11-21 LAB — TSH: TSH: 1.93 u[IU]/mL (ref 0.35–4.50)

## 2017-11-21 LAB — LDL CHOLESTEROL, DIRECT: LDL DIRECT: 184 mg/dL

## 2017-11-21 MED ORDER — LEVOTHYROXINE SODIUM 75 MCG PO TABS: 75.0000 ug | ORAL_TABLET | Freq: Every day | ORAL | 3 refills | Status: DC

## 2017-11-21 NOTE — Patient Instructions (Addendum)
Health Maintenance Due  Topic Date Due  . Hepatitis C Screening - Patient reported she had it done 2 years ago and she doesn't have it. Our team will call [facility] and get results. 07/03/1950  . PNA vac Low Risk Adult (2 of 2 - PPSV23) - Today at office visit 07/27/2016   Please check with your pharmacy to see if they have the shingrix vaccine. If they do- please get this immunization and update [place] by phone call or mychart with dates you receive the vaccine. Get on waiting list.   Please stop by the lab before you go.

## 2017-11-21 NOTE — Assessment & Plan Note (Signed)
S: otc prilosec controlling for most part A/P: with next labs consider b12 given long term ppi use

## 2017-11-21 NOTE — Assessment & Plan Note (Signed)
S: on actonel for osteopenia for 9  years- stopped 2010. Dr. Deatra Ina wanted her off medicine due to no long term studies on its use A/P:  Worst t score -1.3- continue vitamin d. Continue regular walking

## 2017-11-21 NOTE — Assessment & Plan Note (Signed)
S: seasonal allergies continues to have some issues despite nasal steroids and astelin and xyzal A/P: continue current meds and allergist follow up with Dr. Harold Hedge

## 2017-11-21 NOTE — Progress Notes (Signed)
Phone: [phone]  Subjective:  Patient presents today to establish care.  Prior patient of Dr. Renold Genta- wanted to be cared for at same location as husband. Chief complaint-noted.   See problem oriented charting  The following were reviewed and entered/updated in epic: Past Medical History:  Diagnosis Date  . Allergy   . Anxiety   . Arthritis   . Constipation   . Generalized headaches    since teenage years- usually can get away with tylenol  . Hyperlipidemia   . Osteopenia   . Thyroid disease    hypothyroidism   Patient Active Problem List   Diagnosis Date Noted  . Asthma 11/21/2017    Priority: Medium  . Generalized headaches     Priority: Medium  . Fatty infiltration of liver 12/30/2014    Priority: Medium  . Prediabetes 06/30/2014    Priority: Medium  . Hypothyroidism 12/01/2010    Priority: Medium  . Hyperlipidemia 12/01/2010    Priority: Medium  . Osteopenia 12/01/2010    Priority: Medium  . GE reflux 12/01/2010    Priority: Medium  . Trochanteric bursitis of left hip 12/11/2015    Priority: Low  . Statin intolerance 06/30/2014    Priority: Low  . Left knee pain 06/30/2014    Priority: Low  . Allergic rhinitis 12/01/2010    Priority: Low   Past Surgical History:  Procedure Laterality Date  . arthroscopic left knee surgery    . DILATION AND CURETTAGE OF UTERUS     monthlong period related. everything was ok  . NASAL SINUS SURGERY  1986    Family History  Problem Relation Age of Onset  . Stroke Mother        36. never smoker  . Hyperlipidemia Mother   . Other Father        killed in car wreck 2012  . Prostate cancer Father   . Obesity Sister   . Kidney cancer Son     Medications- reviewed and updated Current Outpatient Medications  Medication Sig Dispense Refill  . ACCU-CHEK SMARTVIEW test strip for testing 100 each 99  . aspirin 81 MG EC tablet Take 81 mg by mouth daily.      . Azelastine HCl 137 MCG/SPRAY SOLN Place into the nose.      . cyclobenzaprine (FLEXERIL) 10 MG tablet Take 10 mg by mouth 3 (three) times daily as needed.      Marland Kitchen levocetirizine (XYZAL) 5 MG tablet Take 5 mg by mouth every evening. Prn only    . levothyroxine (SYNTHROID, LEVOTHROID) 75 MCG tablet Take 1 tablet (75 mcg total) by mouth daily. 90 tablet 3  . mometasone (NASONEX) 50 MCG/ACT nasal spray Place 2 sprays into the nose daily.      . sertraline (ZOLOFT) 50 MG tablet TAKE ONE TABLET BY MOUTH ONCE DAILY 90 tablet 3  . Spacer/Aero-Holding Chambers (AEROCHAMBER PLUS FLO-VU) MISC See admin instructions.  1   No current facility-administered medications for this visit.     Allergies-reviewed and updated Allergies  Allergen Reactions  . Tetanus Toxoids Anaphylaxis  . Latex Rash    Social History   Social History Narrative   Married. 5 kids total between her and husband.  One son is a Mudlogger.      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        Hobbies: travel but hsuband ill    ROS--Full ROS was completed Review of  Systems  Constitutional: Negative for chills and fever.  HENT: Positive for ear pain. Negative for hearing loss and sore throat.        Eye itching and redness and post nasal drip  Eyes: Negative for blurred vision and double vision.  Respiratory: Positive for cough. Negative for shortness of breath.   Cardiovascular: Negative for chest pain and palpitations.  Gastrointestinal: Negative for heartburn, nausea and vomiting.  Genitourinary: Negative for dysuria and urgency.  Musculoskeletal: Negative for falls and myalgias.  Skin: Negative for itching and rash.  Neurological: Positive for headaches. Negative for speech change.  Endo/Heme/Allergies: Positive for environmental allergies.  Psychiatric/Behavioral: Negative for hallucinations and substance abuse.     Objective: BP 122/82 (BP Location: Left Arm, Patient Position: Sitting, Cuff Size: Normal)    Pulse 74   Temp 98 F (36.7 C) (Oral)   Ht 5' (1.524 m)   Wt 172 lb 12.8 oz (78.4 kg)   SpO2 96%   BMI 33.75 kg/m  Gen: NAD, resting comfortably HEENT: Mucous membranes are moist. Oropharynx normal. TM normal. Eyes: sclera and lids normal, PERRLA Neck: no thyromegaly, no cervical lymphadenopathy CV: RRR no murmurs rubs or gallops Lungs: CTAB no crackles, wheeze, rhonchi Abdomen: soft/nontender/nondistended/normal bowel sounds. No rebound or guarding.  Ext: no edema Skin: warm, dry Neuro: 5/5 strength in upper and lower extremities, normal gait, normal reflexes  Assessment/Plan:  Hypothyroidism S: On thyroid medication-levothyroxine 75 mcg. Diagnosed in 2010.  Lab Results  Component Value Date   TSH 1.27 05/22/2017  A/P: continue current rx- refill provided  Hyperlipidemia S: poorly controlled on no rx. Statin intolerance despite multiple attempts- myalgias reported. In January she was advised to trial crestor 2-3x a week. And advised ad coq10 if had myalgias.  She failed all these efforts.   Has been on zetia, lipitor, livalo, crestor twice.  Lab Results  Component Value Date   CHOL 252 (H) 05/22/2017   HDL 47 (L) 05/22/2017   LDLCALC 184 (H) 05/22/2017   TRIG 95 05/22/2017   CHOLHDL 5.4 (H) 05/22/2017   A/P: she wants to hold off on retrial for now- with next set of lipids could recheck to see if she could tolerate once a week crestor. Luckily this is for primary prevention- never had a heart attack or stroke so data is not as strong for morbidity reduction. Mother died with a stroke. She wants to continue her asa- since cant tolerate statin reasonable. Has had significant weight loss- recheck in December or later   Allergic rhinitis S: seasonal allergies continues to have some issues despite nasal steroids and astelin and xyzal A/P: continue current meds and allergist follow up with Dr. Harold Hedge  Prediabetes S: prediabetes listed by DR. Baxley- no evidence of this  on last few checks per a1c. Does weight watchers- down 34 lbs in last 6 months !!! Lab Results  Component Value Date   HGBA1C 5.6 05/22/2017   HGBA1C 5.4 06/10/2016   HGBA1C 5.6 12/11/2015  A/P: I would be ok with moving to yearly checks or less given improvement in weight  Osteopenia S: on actonel for osteopenia for 9  years- stopped 2010. Dr. Deatra Ina wanted her off medicine due to no long term studies on its use A/P:  Worst t score -1.3- continue vitamin d. Continue regular walking  Fatty infiltration of liver S: fatty liver history. Has had elevated LFTs in past. Noted on ultrasound 03/04/14 A/P: continue weight loss efforts- she has done a phenomenal job with  over 30 lbs off in 6 months  GE reflux S: otc prilosec controlling for most part A/P: with next labs consider b12 given long term ppi use    Return in about 6 months (around 05/22/2018) for annual wellness visit with nurse. and see me same day. .   Lab/Order associations: Hypothyroidism, unspecified type  Hyperlipidemia, unspecified hyperlipidemia type - Plan: LDL cholesterol, direct, TSH, Comprehensive metabolic panel  Need for prophylactic vaccination against Streptococcus pneumoniae (pneumococcus) - Plan: Pneumococcal polysaccharide vaccine 23-valent greater than or equal to 2yo subcutaneous/IM  Meds ordered this encounter  Medications  . levothyroxine (SYNTHROID, LEVOTHROID) 75 MCG tablet    Sig: Take 1 tablet (75 mcg total) by mouth daily.    Dispense:  90 tablet    Refill:  3    Return precautions advised. Garret Reddish, MD

## 2017-11-21 NOTE — Assessment & Plan Note (Signed)
S: fatty liver history. Has had elevated LFTs in past. Noted on ultrasound 03/04/14 A/P: continue weight loss efforts- she has done a phenomenal job with over 30 lbs off in 6 months

## 2017-11-21 NOTE — Assessment & Plan Note (Signed)
S: prediabetes listed by DR. Baxley- no evidence of this on last few checks per a1c. Does weight watchers- down 34 lbs in last 6 months !!! Lab Results  Component Value Date   HGBA1C 5.6 05/22/2017   HGBA1C 5.4 06/10/2016   HGBA1C 5.6 12/11/2015  A/P: I would be ok with moving to yearly checks or less given improvement in weight

## 2017-11-21 NOTE — Progress Notes (Signed)
Please let us know by responding to this when you get this mychart message and let us know if you understand the results/directions  Your CBC was normal (blood counts, infection fighting cells, platelets). Your CMET was normal (kidney, liver, and electrolytes, blood sugar).  Your cholesterol remains very high. I would suggest regular exercise and mediterranean diet (lots of good info online about this) to try to help. When we get your next full lipids if remains this high- consider the once a week cholesterol medicine option.

## 2017-11-21 NOTE — Assessment & Plan Note (Signed)
S: On thyroid medication-levothyroxine 75 mcg. Diagnosed in 2010.  Lab Results  Component Value Date   TSH 1.27 05/22/2017  A/P: continue current rx- refill provided

## 2017-11-21 NOTE — Assessment & Plan Note (Signed)
S: poorly controlled on no rx. Statin intolerance despite multiple attempts- myalgias reported. In January she was advised to trial crestor 2-3x a week. And advised ad coq10 if had myalgias.  She failed all these efforts.   Has been on zetia, lipitor, livalo, crestor twice.  Lab Results  Component Value Date   CHOL 252 (H) 05/22/2017   HDL 47 (L) 05/22/2017   LDLCALC 184 (H) 05/22/2017   TRIG 95 05/22/2017   CHOLHDL 5.4 (H) 05/22/2017   A/P: she wants to hold off on retrial for now- with next set of lipids could recheck to see if she could tolerate once a week crestor. Luckily this is for primary prevention- never had a heart attack or stroke so data is not as strong for morbidity reduction. Mother died with a stroke. She wants to continue her asa- since cant tolerate statin reasonable. Has had significant weight loss- recheck in December or later

## 2018-04-20 ENCOUNTER — Ambulatory Visit (INDEPENDENT_AMBULATORY_CARE_PROVIDER_SITE_OTHER): Payer: Medicare Other

## 2018-04-20 DIAGNOSIS — Z23 Encounter for immunization: Secondary | ICD-10-CM

## 2018-04-20 NOTE — Patient Instructions (Signed)
There are no preventive care reminders to display for this patient.  Depression screen [facility] 2/9 11/21/2017 05/22/2017 12/30/2014  Decreased Interest 0 0 0  Down, Depressed, Hopeless 0 0 0  PHQ - 2 Score 0 0 0  Altered sleeping 0 - -  Tired, decreased energy 0 - -  Change in appetite 0 - -  Feeling bad or failure about yourself  0 - -  Trouble concentrating 0 - -  Moving slowly or fidgety/restless 0 - -  Suicidal thoughts 0 - -  PHQ-9 Score 0 - -

## 2018-04-20 NOTE — Progress Notes (Signed)
Patient here today for Flu vaccine. Administered in left arm. VIS given. Tolerated well 

## 2018-05-18 ENCOUNTER — Encounter: Payer: Self-pay | Admitting: Family Medicine

## 2018-05-18 ENCOUNTER — Ambulatory Visit (INDEPENDENT_AMBULATORY_CARE_PROVIDER_SITE_OTHER): Payer: Medicare Other | Admitting: Family Medicine

## 2018-05-18 VITALS — BP 138/86 | HR 71 | Temp 98.5°F | Ht 60.0 in | Wt 163.4 lb

## 2018-05-18 DIAGNOSIS — F325 Major depressive disorder, single episode, in full remission: Secondary | ICD-10-CM | POA: Diagnosis not present

## 2018-05-18 DIAGNOSIS — E039 Hypothyroidism, unspecified: Secondary | ICD-10-CM | POA: Diagnosis not present

## 2018-05-18 DIAGNOSIS — Z6831 Body mass index (BMI) 31.0-31.9, adult: Secondary | ICD-10-CM | POA: Diagnosis not present

## 2018-05-18 DIAGNOSIS — E785 Hyperlipidemia, unspecified: Secondary | ICD-10-CM

## 2018-05-18 DIAGNOSIS — K219 Gastro-esophageal reflux disease without esophagitis: Secondary | ICD-10-CM | POA: Diagnosis not present

## 2018-05-18 DIAGNOSIS — Z79899 Other long term (current) drug therapy: Secondary | ICD-10-CM

## 2018-05-18 DIAGNOSIS — R7303 Prediabetes: Secondary | ICD-10-CM

## 2018-05-18 LAB — COMPREHENSIVE METABOLIC PANEL
ALT: 15 U/L (ref 0–35)
AST: 21 U/L (ref 0–37)
Albumin: 4.2 g/dL (ref 3.5–5.2)
Alkaline Phosphatase: 54 U/L (ref 39–117)
BILIRUBIN TOTAL: 0.5 mg/dL (ref 0.2–1.2)
BUN: 12 mg/dL (ref 6–23)
CALCIUM: 9.5 mg/dL (ref 8.4–10.5)
CHLORIDE: 106 meq/L (ref 96–112)
CO2: 29 meq/L (ref 19–32)
Creatinine, Ser: 0.73 mg/dL (ref 0.40–1.20)
GFR: 84.26 mL/min (ref >60.00)
GLUCOSE: 87 mg/dL (ref 70–99)
POTASSIUM: 4.8 meq/L (ref 3.5–5.1)
Sodium: 141 mEq/L (ref 135–145)
Total Protein: 7 g/dL (ref 6.0–8.3)

## 2018-05-18 LAB — VITAMIN B12: Vitamin B-12: 548 pg/mL (ref 211–911)

## 2018-05-18 LAB — LIPID PANEL
Cholesterol: 273 mg/dL — ABNORMAL HIGH (ref 0–200)
HDL: 52.2 mg/dL (ref >39.00)
LDL CALC: 200 mg/dL — AB (ref 0–99)
NonHDL: 220.97
TRIGLYCERIDES: 107 mg/dL (ref 0.0–149.0)
Total CHOL/HDL Ratio: 5
VLDL: 21.4 mg/dL (ref 0.0–40.0)

## 2018-05-18 LAB — CBC
HEMATOCRIT: 36.1 % (ref 36.0–46.0)
HEMOGLOBIN: 12.2 g/dL (ref 12.0–15.0)
MCHC: 33.9 g/dL (ref 30.0–36.0)
MCV: 93.6 fl (ref 78.0–100.0)
PLATELETS: 228 10*3/uL (ref 150.0–400.0)
RBC: 3.86 Mil/uL — ABNORMAL LOW (ref 3.87–5.11)
RDW: 13.5 % (ref 11.5–15.5)
WBC: 5.4 10*3/uL (ref 4.0–10.5)

## 2018-05-18 LAB — HEMOGLOBIN A1C: HEMOGLOBIN A1C: 5.5 % (ref 4.6–6.5)

## 2018-05-18 LAB — TSH: TSH: 1.63 u[IU]/mL (ref 0.35–4.50)

## 2018-05-18 NOTE — Assessment & Plan Note (Signed)
S: patient with history of depression and anxiety- zooft at low dose 50mg  has been really helpful. Tried to come off once but symptoms recurred. Xanax - used to be on this.  A/P: stable- continue current meds- update phq9

## 2018-05-18 NOTE — Assessment & Plan Note (Signed)
S: On thyroid medication-levothyroxine 75 mcg.   Lab Results  Component Value Date   TSH 1.93 11/21/2017  A/P: stable, continue current rx

## 2018-05-18 NOTE — Patient Instructions (Addendum)
Please stop by lab before you go  Please check with your pharmacy to see if they have the shingrix vaccine. If they do- please get this immunization and update [place] by phone call or mychart with dates you receive the vaccine

## 2018-05-18 NOTE — Progress Notes (Signed)
Subjective:  Olivia Bender is a 66 y.o. year old very pleasant female patient who presents for/with See problem oriented charting ROS- some pain in upper lower legs- turmeric has helped some. No chest pain or shortness of breath. No blurry vision.    Past Medical History-  Patient Active Problem List   Diagnosis Date Noted  . 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 Asthma 11/21/2017    Priority: Medium  . Generalized headaches     Priority: Medium  . Fatty infiltration of liver 12/30/2014    Priority: Medium  . Prediabetes 06/30/2014    Priority: Medium  . Hypothyroidism 12/01/2010    Priority: Medium  . Hyperlipidemia 12/01/2010    Priority: Medium  . Osteopenia 12/01/2010    Priority: Medium  . GE reflux 12/01/2010    Priority: Medium  . Trochanteric bursitis of left hip 12/11/2015    Priority: Low  . Statin intolerance 06/30/2014    Priority: Low  . Left knee pain 06/30/2014    Priority: Low  . Allergic rhinitis 12/01/2010    Priority: Low    Medications- reviewed and updated Current Outpatient Medications  Medication Sig Dispense Refill  . aspirin 81 MG EC tablet Take 81 mg by mouth daily.      . Azelastine HCl 137 MCG/SPRAY SOLN Place into the nose.    . cyclobenzaprine (FLEXERIL) 10 MG tablet Take 10 mg by mouth 3 (three) times daily as needed.      Marland Kitchen levocetirizine (XYZAL) 5 MG tablet Take 5 mg by mouth every evening. Prn only    . levothyroxine (SYNTHROID, LEVOTHROID) 75 MCG tablet Take 1 tablet (75 mcg total) by mouth daily. 90 tablet 3  . mometasone (NASONEX) 50 MCG/ACT nasal spray Place 2 sprays into the nose daily.      . sertraline (ZOLOFT) 50 MG tablet TAKE ONE TABLET BY MOUTH ONCE DAILY 90 tablet 3  . Spacer/Aero-Holding Chambers (AEROCHAMBER PLUS FLO-VU) MISC See admin instructions.  1   Objective: BP 138/86 (BP Location: Left Arm, Patient Position: Sitting, Cuff Size: Large)   Pulse 71   Temp 98.5 F (36.9 C)  (Oral)   Ht 5' (1.524 m)   Wt 163 lb 6.4 oz (74.1 kg)   SpO2 94%   BMI 31.91 kg/m  Gen: NAD, resting comfortably TM normal, oropharynx normal, no thyromegaly CV: RRR no murmurs rubs or gallops Lungs: CTAB no crackles, wheeze, rhonchi Abdomen: soft/nontender/nondistended/normal bowel sounds. No rebound or guarding.  Ext: no edema Skin: warm, dry Neuro: grossly normal, moves all extremities  Assessment/Plan:  No urinary issues- opts to skip UA  Continues with weight watchers- doing really well other than last 3 weeks during holiday season. Doing a great job! BMI improving  Hypothyroidism S: On thyroid medication-levothyroxine 75 mcg.   Lab Results  Component Value Date   TSH 1.93 11/21/2017  A/P: stable, continue current rx  Hyperlipidemia S: poorly controlled on no rx-   In January  2019 she was advised to trial crestor 2-3x a week. And advised coq10 if had myalgias.  She failed all these efforts. Prior with extreme leg fatigue-Has been on zetia, lipitor, livalo, crestor twice including once a week.  Lab Results  Component Value Date   CHOL 252 (H) 05/22/2017   HDL 47 (L) 05/22/2017   LDLCALC 184 (H) 05/22/2017   LDLDIRECT 184.0 11/21/2017   TRIG 95 05/22/2017   CHOLHDL 5.4 (H) 05/22/2017   A/P: suspect  poor control but hopeful improved with weight loss. update lipid panel after 12/20- unfortunately has tried many different statins and even zetia and burst therapy with SE intolerable. Efforts for primary prevention luckily- her mother had a stroke though. Given not on statin- wants to continue asa  Prediabetes S: prediabetes per Dr. Renold Genta in past. Down 9 lbs since last visit! Prior a1cs ok- will update again today Lab Results  Component Value Date   HGBA1C 5.6 05/22/2017   HGBA1C 5.4 06/10/2016   HGBA1C 5.6 12/11/2015   A/P: yearly a1c checks likely given prior label prediabetes though recent a1cs have been ok  GE reflux S:  Patient on prilosec 20mg  otc with good  control. Also she is gassy--> takes gas x- does help.  A/P: stable- continue current dose but will check b12.   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 Tried to come off once but symptoms recurred. Xanax - used to be on this.  A/P: stable- continue current meds- update phq9   Return in about 6 months (around 11/17/2018).  Lab/Order associations: FASTING Hypothyroidism, unspecified type - Plan: TSH  Hyperlipidemia, unspecified hyperlipidemia type - Plan: CBC, Comprehensive metabolic panel, Lipid panel  Major depressive disorder with single episode, in full remission ([place])  Prediabetes - Plan: Hemoglobin A1c  BMI 31.0-31.9,adult  Gastroesophageal reflux disease without esophagitis  High risk medication use - Plan: Vitamin B12  Return precautions advised.  Garret Reddish, MD

## 2018-05-18 NOTE — Assessment & Plan Note (Signed)
S: poorly controlled on no rx-   In January  2019 she was advised to trial crestor 2-3x a week. And advised coq10 if had myalgias.  She failed all these efforts. Prior with extreme leg fatigue-Has been on zetia, lipitor, livalo, crestor twice including once a week.  Lab Results  Component Value Date   CHOL 252 (H) 05/22/2017   HDL 47 (L) 05/22/2017   LDLCALC 184 (H) 05/22/2017   LDLDIRECT 184.0 11/21/2017   TRIG 95 05/22/2017   CHOLHDL 5.4 (H) 05/22/2017   A/P: suspect poor control but hopeful improved with weight loss. update lipid panel after 12/20- unfortunately has tried many different statins and even zetia and burst therapy with SE intolerable. Efforts for primary prevention luckily- her mother had a stroke though. Given not on statin- wants to continue asa

## 2018-05-18 NOTE — Assessment & Plan Note (Signed)
S: prediabetes per Dr. Renold Genta in past. Down 9 lbs since last visit! Prior a1cs ok- will update again today Lab Results  Component Value Date   HGBA1C 5.6 05/22/2017   HGBA1C 5.4 06/10/2016   HGBA1C 5.6 12/11/2015   A/P: yearly a1c checks likely given prior label prediabetes though recent a1cs have been ok

## 2018-05-18 NOTE — Assessment & Plan Note (Signed)
S:  Patient on prilosec 20mg  otc with good control. Also she is gassy--> takes gas x- does help.  A/P: stable- continue current dose but will check b12.

## 2018-05-19 ENCOUNTER — Other Ambulatory Visit: Payer: Self-pay

## 2018-05-19 DIAGNOSIS — E785 Hyperlipidemia, unspecified: Secondary | ICD-10-CM

## 2018-05-20 DIAGNOSIS — H1045 Other chronic allergic conjunctivitis: Secondary | ICD-10-CM | POA: Diagnosis not present

## 2018-05-20 DIAGNOSIS — K219 Gastro-esophageal reflux disease without esophagitis: Secondary | ICD-10-CM | POA: Diagnosis not present

## 2018-05-20 DIAGNOSIS — J310 Chronic rhinitis: Secondary | ICD-10-CM | POA: Diagnosis not present

## 2018-05-20 DIAGNOSIS — J4530 Mild persistent asthma, uncomplicated: Secondary | ICD-10-CM | POA: Diagnosis not present

## 2018-06-25 ENCOUNTER — Ambulatory Visit (INDEPENDENT_AMBULATORY_CARE_PROVIDER_SITE_OTHER): Payer: Medicare Other | Admitting: Internal Medicine

## 2018-06-25 ENCOUNTER — Encounter: Payer: Self-pay | Admitting: Internal Medicine

## 2018-06-25 VITALS — BP 128/82 | HR 86 | Ht 60.0 in | Wt 164.0 lb

## 2018-06-25 DIAGNOSIS — Z789 Other specified health status: Secondary | ICD-10-CM | POA: Diagnosis not present

## 2018-06-25 DIAGNOSIS — E785 Hyperlipidemia, unspecified: Secondary | ICD-10-CM | POA: Diagnosis not present

## 2018-06-25 DIAGNOSIS — Z8249 Family history of ischemic heart disease and other diseases of the circulatory system: Secondary | ICD-10-CM

## 2018-06-25 NOTE — Progress Notes (Signed)
LIPID CLINIC CONSULT NOTE  Chief Complaint:  Dyslipidemia  Primary Care Physician: Marin Olp, MD  HPI:  Olivia Bender is a 68 y.o. female who is being seen today for the evaluation of dyslipidemia at the request of Marin Olp, MD.  This is a pleasant 68 year old female with past medical history significant for dyslipidemia, hypothyroidism, anxiety, headaches and some osteopenia.  She had an episode of chest discomfort in 2007 and underwent nuclear stress testing which was negative but otherwise has had no other cardiac testing.  She does have a longstanding history of dyslipidemia but also significant obesity.  She reports she is lost 46 pounds over the last year.  Weight now is 164 with a BMI of 32.  Her blood pressures at goal today 128/82.  Most recent lipid profile after weight loss however actually showed an increase in LDL cholesterol.  Her total cholesterol was 275, triglycerides 107, HDL 52 and LDL 200.  LDL appears to have consistently run between 170 and 200 suggesting possible familial dyslipidemia.  Her [place] lipid score is 4 suggesting possible familial hyperlipidemia.  There is a history of heart disease mostly on her mom's side and her aunt maternal grandmother and grandfather and mom who had A. fib and stroke although she is not aware of significant dyslipidemia in these relatives.  She also has a sister who does not have any significant elevation in cholesterol that she is aware of in 2 children, neither of which have high cholesterol to her knowledge.  Unfortunately she also has statin intolerance.  She is previously been tried on Lipitor, Crestor, Livalo, Zetia and even reduced dose Crestor every other day and once weekly without tolerance due to significant myalgias and cramping of the lower extremities which resolved after discontinuing the medications.  PMHx:  Past Medical History:  Diagnosis Date  . Allergy   . Anxiety   . Arthritis   . Constipation   .  Enlarged liver   . Generalized headaches    since teenage years- usually can get away with tylenol  . Hyperlipidemia   . Osteopenia   . Thyroid disease    hypothyroidism    Past Surgical History:  Procedure Laterality Date  . arthroscopic left knee surgery    . DILATION AND CURETTAGE OF UTERUS     monthlong period related. everything was ok  . NASAL SINUS SURGERY  1986    FAMHx:  Family History  Problem Relation Age of Onset  . Stroke Mother        14. never smoker  . Hypertension Mother   . Other Father        killed in car wreck 2012  . Prostate cancer Father   . Obesity Sister   . Kidney cancer Son   . Heart disease Maternal Grandmother   . Heart disease Maternal Grandfather     SOCHx:   reports that she has never smoked. She has never used smokeless tobacco. She reports that she does not drink alcohol or use drugs.  ALLERGIES:  Allergies  Allergen Reactions  . Tetanus Toxoids Anaphylaxis  . Latex Rash  . Ezetimibe Other (See Comments)    Myalgia  . Statins Other (See Comments)    Lipitor, Crestor, Livalo -myalgias    ROS: Pertinent items noted in HPI and remainder of comprehensive ROS otherwise negative.  HOME MEDS: Current Outpatient Medications on File Prior to Visit  Medication Sig Dispense Refill  . aspirin 81 MG EC  tablet Take 81 mg by mouth daily.      . Azelastine HCl 137 MCG/SPRAY SOLN Place into the nose.    . cyclobenzaprine (FLEXERIL) 10 MG tablet Take 10 mg by mouth 3 (three) times daily as needed.      Marland Kitchen levocetirizine (XYZAL) 5 MG tablet Take 5 mg by mouth every evening. Prn only    . levothyroxine (SYNTHROID, LEVOTHROID) 75 MCG tablet Take 1 tablet (75 mcg total) by mouth daily. 90 tablet 3  . sertraline (ZOLOFT) 50 MG tablet TAKE ONE TABLET BY MOUTH ONCE DAILY 90 tablet 3  . Spacer/Aero-Holding Chambers (AEROCHAMBER PLUS FLO-VU) MISC See admin instructions.  1  . triamcinolone (NASACORT ALLERGY 24HR) 55 MCG/ACT AERO nasal inhaler Place 2  sprays into the nose daily.     No current facility-administered medications on file prior to visit.     LABS/IMAGING: No results found for this or any previous visit (from the past 48 hour(s)). No results found.  LIPID PANEL:    Component Value Date/Time   CHOL 273 (H) 05/18/2018 0936   TRIG 107.0 05/18/2018 0936   HDL 52.20 05/18/2018 0936   CHOLHDL 5 05/18/2018 0936   VLDL 21.4 05/18/2018 0936   LDLCALC 200 (H) 05/18/2018 0936   LDLCALC 184 (H) 05/22/2017 1045   LDLDIRECT 184.0 11/21/2017 1106    WEIGHTS: Wt Readings from Last 3 Encounters:  06/25/18 164 lb (74.4 kg)  05/18/18 163 lb 6.4 oz (74.1 kg)  11/21/17 172 lb 12.8 oz (78.4 kg)    VITALS: BP 128/82 Comment: right arm  Pulse 86   Ht 5' (1.524 m)   Wt 164 lb (74.4 kg)   BMI 32.03 kg/m   EXAM: General appearance: alert, no distress and moderately obese Neck: no carotid bruit, no JVD and thyroid not enlarged, symmetric, no tenderness/mass/nodules Lungs: clear to auscultation bilaterally Heart: regular rate and rhythm, S1, S2 normal, no murmur, click, rub or gallop Abdomen: soft, non-tender; bowel sounds normal; no masses,  no organomegaly Extremities: extremities normal, atraumatic, no cyanosis or edema Pulses: 2+ and symmetric Skin: Skin color, texture, turgor normal. No rashes or lesions Neurologic: Grossly normal : Pleasant  EKG: Deferred  ASSESSMENT: 1. Elevated LDL cholesterol 2. Dutch score 4 -possible FH 3. Family history of coronary disease 4. Statin intolerance  PLAN: 1.   Ms. Frier has elevated LDL cholesterol and a family history of coronary disease.  Unfortunately she has been statin intolerant.  Per current multidisciplinary guidelines with LDL greater than 190 a 50% reduction in LDL cholesterol is warranted, a regardless of history of coronary disease.  She has possible FH based on her [place] score however no clear pattern of elevated cholesterols noted in first-degree relatives.  I would  like to take calcium scoring to see if there is any evidence of premature coronary disease.  I suspect diet may be a big component in her symptoms however she has had 46 pound weight loss without significant change in her lipid profile.  Although she is made some dietary changes and following weight watchers, she does seem to still get a significant amount of cholesterol containing foods including eggs and other meats.  Will provide some dietary information as well today.  I think she is a good candidate for PCSK9 inhibitor given her intolerance to statins, elevated Dutch score, family history of coronary disease and LDL greater than 190 despite weight loss and dietary changes.  Will likely plan to start [facility].  Her husband is on this  is well receives patient assistance due to financial difficulty.  I hope that she will also be eligible for that.  Plan follow-up with me in 3 to 4 months with a lipid profile.  Thanks for the kind referral.  Pixie Casino, MD, FACC, [place] Director of the [facility] Lipid Disorders &  Cardiovascular Risk Reduction Clinic Diplomate of the American Board of Clinical Lipidology Attending Cardiologist  Direct Dial: [phone]  Fax: [phone]  Website:  [email or web address] 06/25/2018, 9:53 AM

## 2018-06-25 NOTE — Patient Instructions (Signed)
Medication Instructions:  Dr. Debara Pickett recommends Repatha 140mg /mL (PCSK9). This is an injectable cholesterol medication. This medication will need prior approval with your insurance company, which we will work on. If the medication is not approved initially, we may need to do an appeal with your insurance. We will keep you updated on this process. This medication can be provided at some local pharmacies or be shipped to you from a specialty pharmacy.   Request for approval of medication will be submitted after coronary calcium score test results are available.  If you need a refill on your cardiac medications before your next appointment, please call your pharmacy.   Lab work: FASTING lab work after 3-4 months on PCSK9 therapy  If you have labs (blood work) drawn today and your tests are completely normal, you will receive your results only by: Marland Kitchen MyChart Message (if you have MyChart) OR . A paper copy in the mail If you have any lab test that is abnormal or we need to change your treatment, we will call you to review the results.  Testing/Procedures: Dr. Debara Pickett has ordered a CT coronary calcium score. This test is done at [address]. This is $150 out of pocket.   Coronary CalciumScan A coronary calcium scan is an imaging test used to look for deposits of calcium and other fatty materials (plaques) in the inner lining of the blood vessels of the heart (coronary arteries). These deposits of calcium and plaques can partly clog and narrow the coronary arteries without producing any symptoms or warning signs. This puts a person at risk for a heart attack. This test can detect these deposits before symptoms develop. Tell a health care provider about:  Any allergies you have.  All medicines you are taking, including vitamins, herbs, eye drops, creams, and over-the-counter medicines.  Any problems you or family members have had with anesthetic medicines.  Any blood disorders  you have.  Any surgeries you have had.  Any medical conditions you have.  Whether you are pregnant or may be pregnant. What are the risks? Generally, this is a safe procedure. However, problems may occur, including:  Harm to a pregnant woman and her unborn baby. This test involves the use of radiation. Radiation exposure can be dangerous to a pregnant woman and her unborn baby. If you are pregnant, you generally should not have this procedure done.  Slight increase in the risk of cancer. This is because of the radiation involved in the test. What happens before the procedure? No preparation is needed for this procedure. What happens during the procedure?  You will undress and remove any jewelry around your neck or chest.  You will put on a hospital gown.  Sticky electrodes will be placed on your chest. The electrodes will be connected to an electrocardiogram (ECG) machine to record a tracing of the electrical activity of your heart.  A CT scanner will take pictures of your heart. During this time, you will be asked to lie still and hold your breath for 2-3 seconds while a picture of your heart is being taken. The procedure may vary among health care providers and hospitals. What happens after the procedure?  You can get dressed.  You can return to your normal activities.  It is up to you to get the results of your test. Ask your health care provider, or the department that is doing the test, when your results will be ready. Summary  A coronary calcium scan is  an imaging test used to look for deposits of calcium and other fatty materials (plaques) in the inner lining of the blood vessels of the heart (coronary arteries).  Generally, this is a safe procedure. Tell your health care provider if you are pregnant or may be pregnant.  No preparation is needed for this procedure.  A CT scanner will take pictures of your heart.  You can return to your normal activities after the scan  is done. This information is not intended to replace advice given to you by your health care provider. Make sure you discuss any questions you have with your health care provider. Document Released: 11/16/2007 Document Revised: 04/08/2016 Document Reviewed: 04/08/2016 Elsevier Interactive Patient Education  2017 [place]: At [facility], you and your health needs are our priority.  As part of our continuing mission to provide you with exceptional heart care, we have created designated Provider Care Teams.  These Care Teams include your primary Cardiologist (physician) and Advanced Practice Providers (APPs -  Physician Assistants and Nurse Practitioners) who all work together to provide you with the care you need, when you need it. . You will need a follow up appointment in 3-4 months with Dr. Debara Pickett ([place])  Any Other Special Instructions Will Be Listed Below (If Applicable).

## 2018-07-06 ENCOUNTER — Inpatient Hospital Stay: Admission: RE | Admit: 2018-07-06 | Payer: Medicare Other | Source: Ambulatory Visit

## 2018-07-23 ENCOUNTER — Telehealth: Payer: Self-pay

## 2018-07-23 MED ORDER — EVOLOCUMAB 140 MG/ML ~~LOC~~ SOAJ: 140.0000 mg | SUBCUTANEOUS | 6 refills | Status: DC

## 2018-07-23 NOTE — Telephone Encounter (Signed)
Called to let the pt know that the pa was approved and [facility] left a msg

## 2018-07-26 ENCOUNTER — Other Ambulatory Visit: Payer: Self-pay | Admitting: Internal Medicine

## 2018-07-27 ENCOUNTER — Other Ambulatory Visit: Payer: Self-pay

## 2018-07-27 ENCOUNTER — Other Ambulatory Visit: Payer: Self-pay | Admitting: Internal Medicine

## 2018-07-27 ENCOUNTER — Other Ambulatory Visit: Payer: Self-pay | Admitting: Family Medicine

## 2018-07-27 MED ORDER — EVOLOCUMAB 140 MG/ML ~~LOC~~ SOAJ: 140.0000 mg | SUBCUTANEOUS | 6 refills | Status: DC

## 2018-07-27 NOTE — Telephone Encounter (Signed)
Copied from [place] [id number] [id number]. Topic: Quick Communication - Rx Refill/Question >> Jul 27, 2018  9:15 AM Judyann Munson wrote: Medication: sertraline (ZOLOFT) 50 MG tablet  Has the patient contacted their pharmacy? Yes   Preferred Pharmacy (with phone number or street name): [facility], [address] [phone] (Phone) [phone] (Fax)    Agent: Please be advised that RX refills may take up to 3 business days. We ask that you follow-up with your pharmacy.

## 2018-07-27 NOTE — Telephone Encounter (Signed)
Pt says that she needs her Repatha called in to the [facility] [place] [facility]... she says her [facility] is the [facility].. Will forward to the Pharmacist.

## 2018-07-27 NOTE — Telephone Encounter (Signed)
Called to let the patient know that her repatha was sent to [facility]

## 2018-07-27 NOTE — Telephone Encounter (Signed)
Requested medication (s) are due for refill today - yes  Requested medication (s) are on the active medication list -yes  Future visit scheduled -yes  Last refill: 07/01/17  Notes to clinic: Patient is requesting a refill of medication prescribed by outside provider- sent for PCP review.          Requested Prescriptions  Pending Prescriptions Disp Refills   sertraline (ZOLOFT) 50 MG tablet 90 tablet 3    Sig: TAKE ONE TABLET BY MOUTH ONCE DAILY     Psychiatry:  Antidepressants - SSRI Passed - 07/27/2018 11:59 AM      Passed - Completed PHQ-2 or PHQ-9 in the last 360 days.      Passed - Valid encounter within last 6 months    Recent Outpatient Visits          2 months ago Hypothyroidism, unspecified type   [place] Hunter, Brayton Mars, MD   8 months ago Hypothyroidism, unspecified type   [place] Hunter, Brayton Mars, MD      Future Appointments            In 1 month Hilty, Nadean Corwin, MD [facility], [facility]   In 3 months Hunter, Brayton Mars, MD [place], [facility]            Requested Prescriptions  Pending Prescriptions Disp Refills   sertraline (ZOLOFT) 50 MG tablet 90 tablet 3    Sig: TAKE ONE TABLET BY MOUTH ONCE DAILY     Psychiatry:  Antidepressants - SSRI Passed - 07/27/2018 11:59 AM      Passed - Completed PHQ-2 or PHQ-9 in the last 360 days.      Passed - Valid encounter within last 6 months    Recent Outpatient Visits          2 months ago Hypothyroidism, unspecified type   [place] Hunter, Brayton Mars, MD   8 months ago Hypothyroidism, unspecified type   [place] Hunter, Brayton Mars, MD      Future Appointments            In 1 month Hilty, Nadean Corwin, MD [facility] [address] [place], [facility]   In 3 months Hunter, Brayton Mars, MD [place], [place]

## 2018-07-27 NOTE — Telephone Encounter (Signed)
New Message:    Patient states that her medication was sent to the wrong pharmacy. Please call patient back. Also patient wife states that he has not shot in a month.

## 2018-07-27 NOTE — Telephone Encounter (Signed)
Pt called to let me know that the repatha was over $200 and that they cannot afford it so we will try to get them on pt assistance

## 2018-07-29 ENCOUNTER — Ambulatory Visit
Admission: RE | Admit: 2018-07-29 | Discharge: 2018-07-29 | Disposition: A | Discharge status: Home / Self Care | Payer: Self-pay | Source: Ambulatory Visit | Attending: Internal Medicine | Admitting: Internal Medicine

## 2018-07-29 DIAGNOSIS — E785 Hyperlipidemia, unspecified: Secondary | ICD-10-CM

## 2018-07-29 MED ORDER — SERTRALINE HCL 50 MG PO TABS: ORAL_TABLET | ORAL | 3 refills | Status: DC

## 2018-07-29 NOTE — Telephone Encounter (Signed)
See note

## 2018-07-30 ENCOUNTER — Other Ambulatory Visit: Payer: Self-pay | Admitting: Internal Medicine

## 2018-07-30 ENCOUNTER — Telehealth: Payer: Self-pay | Admitting: Internal Medicine

## 2018-07-30 ENCOUNTER — Encounter: Payer: Self-pay | Admitting: Interventional Cardiology

## 2018-07-30 NOTE — Telephone Encounter (Signed)
Contacted the patients pcp, Dr.Hunter's office. The patient was seen by Dr.Hilty in the lipid clinic and a CT calcium score was ordered. There was a subsequent finding of a right lower lobe nodule measuring 10 mm on image 14. It is the Radiologist recommendation for the patienet to have a repeat Chest CT in 3 months. The results have been fwd to Dr.Hunter's office and he is aware of the results. The patient is scheduled to see Dr.Hunter in June. There was a question by Dr.Hunter on whether the patient needed to be seen sooner. Per Dr.Hilty the June appt is fine, he just wants to make sure that Dr.Hunter is aware and will f/u with the recommended testing.   Contacted Dr.Hunter's office and spoke with his [place]. Tillie Rung is aware of the documentation above and will fwd the message to [place].

## 2018-07-30 NOTE — Telephone Encounter (Signed)
Pt aware of calcium score and recommendations re lung nodule Test forwarded to Dr Yong Channel for monitoring nodule .Adonis Housekeeper

## 2018-07-30 NOTE — Telephone Encounter (Signed)
Follow up    Patient states that she was speaking with someone and the call went out. She can be reached at [phone] and [phone]. Please return call.

## 2018-07-30 NOTE — Telephone Encounter (Signed)
Olivia Bender- we will order this test at follow up in June. I assume patient was made aware of results and plan by your office already? If not- please let her know of results and follow up plan with me at visit in June.

## 2018-07-30 NOTE — Telephone Encounter (Signed)
-----   Message from Pixie Casino, MD sent at 07/30/2018  8:10 AM EST ----- Calcium score low at 7 - however, there was a 10 mm right lower lobe nodule. Please reorder repeat chest CT for nodule follow-up in 3 months . Recommend follow-up with PCP to monitor pulmonary nodule as well.  Dr. Lemmie Evens

## 2018-07-30 NOTE — Telephone Encounter (Signed)
New Message   PT says she got a notification on my chart and is calling to contact nurse about test results.  Please call back

## 2018-07-31 NOTE — Telephone Encounter (Signed)
Thanks so much. 

## 2018-07-31 NOTE — Telephone Encounter (Addendum)
Hello Dr.Hunter-  The patient has been made aware of the results, and to keep her appt with you in June with the recommendation for follow up testing. Thanks for the response.

## 2018-08-03 ENCOUNTER — Other Ambulatory Visit: Payer: Self-pay | Admitting: Internal Medicine

## 2018-08-03 NOTE — Telephone Encounter (Signed)
I asked you to call them not send a message. I have sent many messages.

## 2018-08-03 NOTE — Telephone Encounter (Signed)
I have refused this many times. She is now seen at [facility]. Please call them and ask them to stop contacting me

## 2018-08-11 MED ORDER — SERTRALINE HCL 50 MG PO TABS: ORAL_TABLET | ORAL | 3 refills | Status: DC

## 2018-08-11 NOTE — Addendum Note (Signed)
Addended by: Marian Sorrow on: 08/11/2018 02:07 PM   Modules accepted: Orders

## 2018-08-11 NOTE — Telephone Encounter (Signed)
Patient is calling today stating the pharmacy never received the refill sent on 07/29/2018. Please resend.

## 2018-08-11 NOTE — Telephone Encounter (Signed)
Left message on voicemail Rx was resent to pharmacy as requested.

## 2018-08-17 ENCOUNTER — Encounter

## 2018-09-09 ENCOUNTER — Telehealth: Payer: Self-pay

## 2018-09-09 NOTE — Telephone Encounter (Signed)
Contacted pt to change appointment to virtual visit. Pt state she would prefer to reschedule. Appointment rescheduled for 8/18

## 2018-09-16 ENCOUNTER — Ambulatory Visit: Payer: Medicare Other | Admitting: Internal Medicine

## 2018-10-29 ENCOUNTER — Other Ambulatory Visit: Payer: Self-pay

## 2018-10-29 ENCOUNTER — Ambulatory Visit (INDEPENDENT_AMBULATORY_CARE_PROVIDER_SITE_OTHER): Payer: Medicare Other

## 2018-10-29 ENCOUNTER — Encounter: Payer: Self-pay | Admitting: Podiatry

## 2018-10-29 ENCOUNTER — Ambulatory Visit (INDEPENDENT_AMBULATORY_CARE_PROVIDER_SITE_OTHER): Payer: Medicare Other | Admitting: Podiatry

## 2018-10-29 ENCOUNTER — Other Ambulatory Visit: Payer: Self-pay | Admitting: Podiatry

## 2018-10-29 VITALS — BP 131/75 | HR 85 | Temp 97.9°F

## 2018-10-29 DIAGNOSIS — M722 Plantar fascial fibromatosis: Secondary | ICD-10-CM

## 2018-10-29 DIAGNOSIS — M659 Synovitis and tenosynovitis, unspecified: Secondary | ICD-10-CM

## 2018-10-29 DIAGNOSIS — M65971 Unspecified synovitis and tenosynovitis, right ankle and foot: Secondary | ICD-10-CM

## 2018-10-29 NOTE — Progress Notes (Signed)
Subjective:  Patient ID: Olivia Bender, female    DOB: 03/04/1951,  MRN: [id number] HPI Chief Complaint  Patient presents with  . Foot Pain    L-bottom of heel to lateral; "only painful if on foot all day x4-55mo; bunion pain sometimes, been off and on for years"  . Ankle Pain    R-medial going up leg; "sharp shooting pains that go up my leg; x4-42mo"    68 y.o. female presents with the above complaint.   ROS: Denies fever chills nausea vomiting muscle aches pains calf pain back pain chest pain shortness of breath.  Past Medical History:  Diagnosis Date  . Allergy   . Anxiety   . Arthritis   . Constipation   . Enlarged liver   . Generalized headaches    since teenage years- usually can get away with tylenol  . Hyperlipidemia   . Osteopenia   . Thyroid disease    hypothyroidism   Past Surgical History:  Procedure Laterality Date  . arthroscopic left knee surgery    . DILATION AND CURETTAGE OF UTERUS     monthlong period related. everything was ok  . NASAL SINUS SURGERY  1986    Current Outpatient Medications:  .  aspirin 81 MG EC tablet, Take 81 mg by mouth daily.  , Disp: , Rfl:  .  Azelastine HCl 137 MCG/SPRAY SOLN, Place into the nose., Disp: , Rfl:  .  cyclobenzaprine (FLEXERIL) 10 MG tablet, Take 10 mg by mouth 3 (three) times daily as needed.  , Disp: , Rfl:  .  Evolocumab (REPATHA SURECLICK) 233 MG/ML SOAJ, Inject 140 mg into the skin every 14 (fourteen) days., Disp: 2 pen, Rfl: 6 .  levocetirizine (XYZAL) 5 MG tablet, Take 5 mg by mouth every evening. Prn only, Disp: , Rfl:  .  levothyroxine (SYNTHROID, LEVOTHROID) 75 MCG tablet, Take 1 tablet (75 mcg total) by mouth daily., Disp: 90 tablet, Rfl: 3 .  sertraline (ZOLOFT) 50 MG tablet, TAKE ONE TABLET BY MOUTH ONCE DAILY, Disp: 90 tablet, Rfl: 3 .  Spacer/Aero-Holding Chambers (AEROCHAMBER PLUS FLO-VU) MISC, See admin instructions., Disp: , Rfl: 1 .  triamcinolone (NASACORT ALLERGY 24HR) 55 MCG/ACT AERO nasal  inhaler, Place 2 sprays into the nose daily., Disp: , Rfl:   Allergies  Allergen Reactions  . Tetanus Toxoids Anaphylaxis  . Latex Rash  . Ezetimibe Other (See Comments)    Myalgia  . Statins Other (See Comments)    Lipitor, Crestor, Livalo -myalgias   Review of Systems Objective:   Vitals:   10/29/18 1032  BP: 131/75  Pulse: 85  Temp: 97.9 F (36.6 C)    General: Well developed, nourished, in no acute distress, alert and oriented x3   Dermatological: Skin is warm, dry and supple bilateral. Nails x 10 are well maintained; remaining integument appears unremarkable at this time. There are no open sores, no preulcerative lesions, no rash or signs of infection present.  Vascular: Dorsalis Pedis artery and Posterior Tibial artery pedal pulses are 2/4 bilateral with immedate capillary fill time. Pedal hair growth present. No varicosities and no lower extremity edema present bilateral.   Neruologic: Grossly intact via light touch bilateral. Vibratory intact via tuning fork bilateral. Protective threshold with Semmes Wienstein monofilament intact to all pedal sites bilateral. Patellar and Achilles deep tendon reflexes 2+ bilateral. No Babinski or clonus noted bilateral.   Musculoskeletal: No gross boney pedal deformities bilateral. No pain, crepitus, or limitation noted with foot and ankle range of  motion bilateral. Muscular strength 5/5 in all groups tested bilateral.  She has pain on palpation medial calcaneal tubercles bilateral.  She also has some tenderness on palpation of the saphenous nerve on the medial right ankle.  Gait: Unassisted, Nonantalgic.    Radiographs:  Radiographs today do not demonstrate any acute findings.  Soft tissue increase in density plantar fashion calcaneal insertion site is noted bilateral.  Assessment & Plan:   Assessment: Plantar fasciitis bilateral.  Plan: After sterile Betadine skin prep I injected 20 mg of Kenalog 5 mg Marcaine point of maximal  tenderness of the right heel.  Tolerated procedure well without complications.  Discussed appropriate shoe gear stretching exercise ice therapy and shoe gear modifications follow-up with her in 1 month.     Warren Lindahl T. [place], [place]

## 2018-11-13 ENCOUNTER — Telehealth: Payer: Self-pay

## 2018-11-13 NOTE — Telephone Encounter (Signed)
Patient is on schedule for Doxy.me, June 15,2020

## 2018-11-13 NOTE — Telephone Encounter (Signed)
Copied from [place] [id number]. Topic: General - Other >> Nov 12, 2018  5:00 PM Rainey Pines A wrote: Patient would like office to call [phone] for virtual visit on 11/16/2018

## 2018-11-16 ENCOUNTER — Ambulatory Visit (INDEPENDENT_AMBULATORY_CARE_PROVIDER_SITE_OTHER): Payer: Medicare Other | Admitting: Family Medicine

## 2018-11-16 ENCOUNTER — Encounter: Payer: Self-pay | Admitting: Family Medicine

## 2018-11-16 VITALS — BP 122/82 | HR 87 | Ht 60.0 in | Wt 163.0 lb

## 2018-11-16 DIAGNOSIS — R51 Headache: Secondary | ICD-10-CM

## 2018-11-16 DIAGNOSIS — F325 Major depressive disorder, single episode, in full remission: Secondary | ICD-10-CM

## 2018-11-16 DIAGNOSIS — R911 Solitary pulmonary nodule: Secondary | ICD-10-CM | POA: Diagnosis not present

## 2018-11-16 DIAGNOSIS — M85852 Other specified disorders of bone density and structure, left thigh: Secondary | ICD-10-CM | POA: Diagnosis not present

## 2018-11-16 DIAGNOSIS — E039 Hypothyroidism, unspecified: Secondary | ICD-10-CM | POA: Diagnosis not present

## 2018-11-16 DIAGNOSIS — E785 Hyperlipidemia, unspecified: Secondary | ICD-10-CM

## 2018-11-16 DIAGNOSIS — R519 Headache, unspecified: Secondary | ICD-10-CM

## 2018-11-16 DIAGNOSIS — R7303 Prediabetes: Secondary | ICD-10-CM

## 2018-11-16 MED ORDER — AMOXICILLIN-POT CLAVULANATE 875-125 MG PO TABS: 1.0000 | ORAL_TABLET | Freq: Two times a day (BID) | ORAL | 0 refills | Status: AC

## 2018-11-16 NOTE — Progress Notes (Signed)
Phone [phone]   Subjective:  Virtual visit via Video note. Chief complaint: Chief Complaint  Patient presents with  . Hypothyroidism   This visit type was conducted due to national recommendations for restrictions regarding the COVID-19 Pandemic (e.g. social distancing).  This format is felt to be most appropriate for this patient at this time balancing risks to patient and risks to population by having him in for in person visit.  No physical exam was performed (except for noted visual exam or audio findings with Telehealth visits).    Our team/I connected with Alechia P Canniff at  8:40 AM EDT by a video enabled telemedicine application (doxy.me or caregility through epic) and verified that I am speaking with the correct person using two identifiers.  Location patient: [place] Location provider: [facility], office Persons participating in the virtual visit:  patient  Our team/I discussed the limitations of evaluation and management by telemedicine and the availability of in person appointments. In light of current covid-19 pandemic, patient also understands that we are trying to protect them by minimizing in office contact if at all possible.  The patient expressed consent for telemedicine visit and agreed to proceed. Patient understands insurance will be billed.   ROS- No fever, chills, cough, shortness of breath, body aches, sore throat, or loss of taste or smell. Nasal congestion as below   Past Medical History-  Patient Active Problem List   Diagnosis Date Noted  . Major depression in full remission ([place]) 05/18/2018    Priority: Medium  . Asthma 11/21/2017    Priority: Medium  . Generalized headaches     Priority: Medium  . Fatty infiltration of liver 12/30/2014    Priority: Medium  . Prediabetes 06/30/2014    Priority: Medium  . Hypothyroidism 12/01/2010    Priority: Medium  . Hyperlipidemia 12/01/2010    Priority: Medium  . Osteopenia 12/01/2010    Priority: Medium   . GE reflux 12/01/2010    Priority: Medium  . Trochanteric bursitis of left hip 12/11/2015    Priority: Low  . Statin intolerance 06/30/2014    Priority: Low  . Left knee pain 06/30/2014    Priority: Low  . Allergic rhinitis 12/01/2010    Priority: Low    Medications- reviewed and updated Current Outpatient Medications  Medication Sig Dispense Refill  . aspirin 81 MG EC tablet Take 81 mg by mouth daily.      . Azelastine HCl 137 MCG/SPRAY SOLN Place into the nose.    . cyclobenzaprine (FLEXERIL) 10 MG tablet Take 10 mg by mouth 3 (three) times daily as needed.      Marland Kitchen levocetirizine (XYZAL) 5 MG tablet Take 5 mg by mouth every evening. Prn only    . levothyroxine (SYNTHROID, LEVOTHROID) 75 MCG tablet Take 1 tablet (75 mcg total) by mouth daily. 90 tablet 3  . sertraline (ZOLOFT) 50 MG tablet TAKE ONE TABLET BY MOUTH ONCE DAILY 90 tablet 3  . Spacer/Aero-Holding Chambers (AEROCHAMBER PLUS FLO-VU) MISC See admin instructions.  1  . triamcinolone (NASACORT ALLERGY 24HR) 55 MCG/ACT AERO nasal inhaler Place 2 sprays into the nose daily.    Marland Kitchen amoxicillin-clavulanate (AUGMENTIN) 875-125 MG tablet Take 1 tablet by mouth 2 (two) times daily for 7 days. 14 tablet 0  . Evolocumab (REPATHA SURECLICK) 761 MG/ML SOAJ Inject 140 mg into the skin every 14 (fourteen) days. (Patient not taking: Reported on 11/16/2018) 2 pen 6   No current facility-administered medications for this visit.  Objective:  BP 122/82 (BP Location: Left Arm, Patient Position: Sitting, Cuff Size: Normal)   Pulse 87   Ht 5' (1.524 m)   Wt 163 lb (73.9 kg)   BMI 31.83 kg/m  self reported vitals Gen: NAD, resting comfortably Lungs: nonlabored, normal respiratory rate  Skin: appears dry, no obvious rash    Assessment and Plan   #hypothyroidism S: On thyroid medication-levothyroxine 75 mcg Lab Results  Component Value Date   TSH 1.63 05/18/2018   A/P: Stable. Continue current medications.  We will see if TSH can  be added with next labs from cardiology/Dr. Debara Pickett   #hyperlipidemia S: Poorly controlled on no statin.  Has failed Crestor twice a week with coenzyme Q 10, Zetia, Lipitor, Livalo, Crestor once a week as well  repatha was over $200 a month- still trying to get assistance Lab Results  Component Value Date   CHOL 273 (H) 05/18/2018   HDL 52.20 05/18/2018   LDLCALC 200 (H) 05/18/2018   LDLDIRECT 184.0 11/21/2017   TRIG 107.0 05/18/2018   CHOLHDL 5 05/18/2018   A/P: likely poorly controlled- hoping she can get assistance so she can take repatha  # prediabetes S:per Dr. Renold Genta in past. Last visit down 9 lbs and a1c was normal- weight stable through covid 19 which is encouraing Lab Results  Component Value Date   HGBA1C 5.5 05/18/2018   Wt Readings from Last 3 Encounters:  11/16/18 163 lb (73.9 kg)  06/25/18 164 lb (74.4 kg)  05/18/18 163 lb 6.4 oz (74.1 kg)  A/P: congratulated her on not gaining weight through covid 19. Consider fasting cbg alone or a1c at follow up   # Depression S: excellent control on zoloft 50 mg. Recurrence when came off medicine in past Depression screen [facility] 2/9 11/16/2018  Decreased Interest 0  Down, Depressed, Hopeless 0  PHQ - 2 Score 0  Altered sleeping 0  Tired, decreased energy 0  Change in appetite 1  Feeling bad or failure about yourself  0  Trouble concentrating 0  Moving slowly or fidgety/restless 0  Suicidal thoughts 0  PHQ-9 Score 1  Difficult doing work/chores Not difficult at all  A/P: Stable. Continue current medications.   # Sinus pressure/dizziness/fluid feeling in head when leans over S:sudafed seems to help. Been going on for at least a month. When she bends over feels like fluid rushes in her head and sinuses. Been getting dizziness intermittently as well- particularly with bending over.  Has been getting sinus discharge and pressure.   Sees Dr. Harold Hedge next week- on xyzal, astelin, nasocort A/P: may have sinus infection- we will  trial augmentin. Luckily has close follow up next week with Dr. Harold Hedge as well order lab mitten - we discussed cutting down on coffee and increasing water  # lung nodule S:from ct 07/29/2018 "IMPRESSION: 10 mm right lower lobe pulmonary nodule. Consider one of the following in 3 months for both low-risk and high-risk individuals: (a) repeat chest CT, (b) follow-up PET-CT, or (c) tissue sampling. This recommendation follows the consensus "  A/P: We discussed options and opted for 31-month CT scan at this time-if any progression would consider PET/CT or also consider follow-up with recommendations from radiology  # mammogram- has appointment on Wednesday- she will have them send [place] a copy  # Bone density- needs update as last 2018. Takes vitamin D.   6 month follow up - hopefully in person Future Appointments  Date Time Provider [place]  12/03/2018 10:45  AM [place], [place] T, [place] [facility]  01/21/2019  8:30 AM Hilty, Nadean Corwin, MD [facility]   Lab/Order associations:   ICD-10-CM   1. Hypothyroidism, unspecified type  E03.9 TSH  2. Major depressive disorder with single episode, in full remission ([place])  F32.5   3. Hyperlipidemia, unspecified hyperlipidemia type  E78.5   4. Solitary pulmonary nodule  R91.1 CT Chest Wo Contrast  5. Prediabetes  R73.03   6. Generalized headaches  R51   7. Osteopenia of neck of left femur  M85.852 DG Bone Density   Meds ordered this encounter  Medications  . amoxicillin-clavulanate (AUGMENTIN) 875-125 MG tablet    Sig: Take 1 tablet by mouth 2 (two) times daily for 7 days.    Dispense:  14 tablet    Refill:  0   Return precautions advised.  Garret Reddish, MD

## 2018-11-16 NOTE — Patient Instructions (Addendum)
Health Maintenance Due  Topic Date Due  . MAMMOGRAM Appointment on Wednesday 10/01/2018    Depression screen [facility] 2/9 05/18/2018 11/21/2017 05/22/2017  Decreased Interest 0 0 0  Down, Depressed, Hopeless 0 0 0  PHQ - 2 Score 0 0 0  Altered sleeping 0 0 -  Tired, decreased energy 1 0 -  Change in appetite 0 0 -  Feeling bad or failure about yourself  0 0 -  Trouble concentrating 0 0 -  Moving slowly or fidgety/restless 0 0 -  Suicidal thoughts 0 0 -  PHQ-9 Score 1 0 -  Difficult doing work/chores Not difficult at all - -

## 2018-11-18 DIAGNOSIS — M85851 Other specified disorders of bone density and structure, right thigh: Secondary | ICD-10-CM | POA: Diagnosis not present

## 2018-11-18 DIAGNOSIS — Z1231 Encounter for screening mammogram for malignant neoplasm of breast: Secondary | ICD-10-CM | POA: Diagnosis not present

## 2018-11-18 DIAGNOSIS — R634 Abnormal weight loss: Secondary | ICD-10-CM | POA: Diagnosis not present

## 2018-11-18 DIAGNOSIS — Z8709 Personal history of other diseases of the respiratory system: Secondary | ICD-10-CM | POA: Diagnosis not present

## 2018-11-18 LAB — HM MAMMOGRAPHY

## 2018-11-18 LAB — HM DEXA SCAN

## 2018-11-23 DIAGNOSIS — J4520 Mild intermittent asthma, uncomplicated: Secondary | ICD-10-CM | POA: Diagnosis not present

## 2018-11-23 DIAGNOSIS — K219 Gastro-esophageal reflux disease without esophagitis: Secondary | ICD-10-CM | POA: Diagnosis not present

## 2018-11-23 DIAGNOSIS — H1045 Other chronic allergic conjunctivitis: Secondary | ICD-10-CM | POA: Diagnosis not present

## 2018-11-23 DIAGNOSIS — J310 Chronic rhinitis: Secondary | ICD-10-CM | POA: Diagnosis not present

## 2018-11-25 ENCOUNTER — Encounter: Payer: Self-pay | Admitting: Family Medicine

## 2018-11-26 ENCOUNTER — Encounter: Payer: Self-pay | Admitting: Family Medicine

## 2018-11-30 ENCOUNTER — Encounter: Payer: Self-pay | Admitting: Family Medicine

## 2018-12-03 ENCOUNTER — Ambulatory Visit: Payer: Medicare Other | Admitting: Podiatry

## 2018-12-10 ENCOUNTER — Telehealth: Payer: Self-pay

## 2018-12-10 ENCOUNTER — Telehealth: Payer: Self-pay | Admitting: Family Medicine

## 2018-12-10 MED ORDER — REPATHA SURECLICK 140 MG/ML ~~LOC~~ SOAJ: 140.0000 mg | SUBCUTANEOUS | 6 refills | Status: DC

## 2018-12-10 NOTE — Telephone Encounter (Signed)
Follow up  Patient is calling to follow up on patient assistance for the repatha per the previous message. Please call.

## 2018-12-10 NOTE — Telephone Encounter (Signed)
Called and spoke w/ pt regarding getting pt assistance I told her Olivia Bender is back currently and has her file with the pt assistance information so I am not quite sure where we stand with that. I gave the pt [employer] phone number to apply and will route this phone tote to jenna to Olivia Bender her aware as well.

## 2018-12-10 NOTE — Telephone Encounter (Signed)
Pt called to let us know that she got approved through the [employer] for a $2500 grant towards her medication I instructed the pt to call jenna elkin RN back if she has any further questions

## 2018-12-10 NOTE — Telephone Encounter (Signed)

## 2018-12-11 ENCOUNTER — Encounter: Payer: Self-pay | Admitting: Family Medicine

## 2018-12-11 ENCOUNTER — Ambulatory Visit (INDEPENDENT_AMBULATORY_CARE_PROVIDER_SITE_OTHER)
Admission: RE | Admit: 2018-12-11 | Discharge: 2018-12-11 | Disposition: A | Discharge status: Home / Self Care | Payer: Medicare Other | Source: Ambulatory Visit | Attending: Family Medicine | Admitting: Family Medicine

## 2018-12-11 ENCOUNTER — Other Ambulatory Visit: Payer: Self-pay

## 2018-12-11 DIAGNOSIS — R911 Solitary pulmonary nodule: Secondary | ICD-10-CM | POA: Diagnosis not present

## 2018-12-11 DIAGNOSIS — I700 Atherosclerosis of aorta: Secondary | ICD-10-CM | POA: Diagnosis not present

## 2018-12-11 NOTE — Telephone Encounter (Signed)
Spoke with patient who reports she took patient assistance info to pharmacy and Repatha should be available to her later today or tomorrow. R/S her 01/21/19 3 month LIPID clinic visit with MD to 03/15/19 and advised her to have labs done about 1 week prior - this will give her about 3 months on treatment.

## 2018-12-23 ENCOUNTER — Other Ambulatory Visit: Payer: Self-pay | Admitting: Family Medicine

## 2019-01-19 ENCOUNTER — Ambulatory Visit: Payer: Medicare Other | Admitting: Internal Medicine

## 2019-01-19 ENCOUNTER — Other Ambulatory Visit: Payer: Self-pay

## 2019-01-19 ENCOUNTER — Ambulatory Visit (INDEPENDENT_AMBULATORY_CARE_PROVIDER_SITE_OTHER): Payer: Medicare Other

## 2019-01-19 VITALS — BP 108/64 | Temp 96.5°F | Ht 60.0 in | Wt 171.0 lb

## 2019-01-19 DIAGNOSIS — Z0000 Encounter for general adult medical examination without abnormal findings: Secondary | ICD-10-CM

## 2019-01-19 NOTE — Progress Notes (Signed)
Subjective:   Olivia Bender is a 68 y.o. female who presents for Medicare Annual (Subsequent) preventive examination.  Review of Systems:   Cardiac Risk Factors include: dyslipidemia     Objective:     Vitals: BP 108/64   Temp (!) 96.5 F (35.8 C)   Ht 5' (1.524 m)   Wt 171 lb (77.6 kg)   BMI 33.40 kg/m   Body mass index is 33.4 kg/m.  Advanced Directives 01/19/2019 07/31/2016 06/10/2016  Does Patient Have a Medical Advance Directive? Yes No Yes  Type of Advance Directive Living will;Healthcare Power of Attorney - Living will;Healthcare Power of Attorney  Does patient want to make changes to medical advance directive? No - Patient declined - -  Copy of [place] in Chart? No - copy requested - No - copy requested  Would patient like information on creating a medical advance directive? - No - Patient declined -    Tobacco Social History   Tobacco Use  Smoking Status Never Smoker  Smokeless Tobacco Never Used     Counseling given: No   Clinical Intake:  Pre-visit preparation completed: Yes  Pain : No/denies pain  Diabetes: No  How often do you need to have someone help you when you read instructions, pamphlets, or other written materials from your doctor or pharmacy?: 1 - Never  Interpreter Needed?: No  Comments: accompanied by spouse Information entered by :: Denman George LPN  Past Medical History:  Diagnosis Date  . Allergy   . Anxiety   . Arthritis   . Constipation   . Enlarged liver   . Generalized headaches    since teenage years- usually can get away with tylenol  . Hyperlipidemia   . Osteopenia   . Thyroid disease    hypothyroidism   Past Surgical History:  Procedure Laterality Date  . arthroscopic left knee surgery    . DILATION AND CURETTAGE OF UTERUS     monthlong period related. everything was ok  . NASAL SINUS SURGERY  1986   Family History  Problem Relation Age of Onset  . Stroke Mother        36. never  smoker  . Hypertension Mother   . Other Father        killed in car wreck 2012  . Prostate cancer Father   . Obesity Sister   . Kidney cancer Son   . Heart disease Maternal Grandmother   . Heart disease Maternal Grandfather    Social History   Socioeconomic History  . Marital status: Married    Spouse name: Not on file  . Number of children: Not on file  . Years of education: Not on file  . Highest education level: Not on file  Occupational History  . Not on file  Social Needs  . Financial resource strain: Not on file  . Food insecurity    Worry: Not on file    Inability: Not on file  . Transportation needs    Medical: Not on file    Non-medical: Not on file  Tobacco Use  . Smoking status: Never Smoker  . Smokeless tobacco: Never Used  Substance and Sexual Activity  . Alcohol use: No  . Drug use: Never  . Sexual activity: Not on file  Lifestyle  . Physical activity    Days per week: Not on file    Minutes per session: Not on file  . Stress: Not on file  Relationships  .  Social Herbalist on phone: Not on file    Gets together: Not on file    Attends religious service: Not on file    Active member of club or organization: Not on file    Attends meetings of clubs or organizations: Not on file    Relationship status: Not on file  Other Topics Concern  . Not on file  Social History Narrative   Married. 5 kids total between her and husband.  One son is a Mudlogger.      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        Hobbies: travel but hsuband ill    Outpatient Encounter Medications as of 01/19/2019  Medication Sig  . aspirin 81 MG EC tablet Take 81 mg by mouth daily.    . Azelastine HCl 137 MCG/SPRAY SOLN Place into the nose.  . cyclobenzaprine (FLEXERIL) 10 MG tablet Take 10 mg by mouth 3 (three) times daily as needed.    Arna Medici 75 MCG tablet Take 1 tablet by mouth once daily  .  Evolocumab (REPATHA SURECLICK) 824 MG/ML SOAJ Inject 140 mg into the skin every 14 (fourteen) days.  Marland Kitchen levocetirizine (XYZAL) 5 MG tablet Take 5 mg by mouth every evening. Prn only  . sertraline (ZOLOFT) 50 MG tablet TAKE ONE TABLET BY MOUTH ONCE DAILY  . Spacer/Aero-Holding Chambers (AEROCHAMBER PLUS FLO-VU) MISC See admin instructions.  . triamcinolone (NASACORT ALLERGY 24HR) 55 MCG/ACT AERO nasal inhaler Place 2 sprays into the nose daily.   No facility-administered encounter medications on file as of 01/19/2019.     Activities of Daily Living In your present state of health, do you have any difficulty performing the following activities: 01/19/2019  Hearing? N  Vision? N  Difficulty concentrating or making decisions? N  Walking or climbing stairs? N  Dressing or bathing? N  Doing errands, shopping? N  Preparing Food and eating ? N  Using the Toilet? N  In the past six months, have you accidently leaked urine? N  Do you have problems with loss of bowel control? N  Managing your Medications? N  Managing your Finances? N  Housekeeping or managing your Housekeeping? N  Some recent data might be hidden    Patient Care Team: Marin Olp, MD as PCP - General (Family Medicine)    Assessment:   This is a routine wellness examination for Olivia Bender.  Exercise Activities and Dietary recommendations    Goals   None     Fall Risk Fall Risk  01/19/2019 11/16/2018 05/22/2017 12/20/2016 12/30/2014  Falls in the past year? 0 1 Yes No No  Comment - - - Emmi Telephone Survey: data to providers prior to load -  Number falls in past yr: 0 1 1 - -  Injury with Fall? 0 0 No - -  Follow up Education provided - - - -    Timed Get Up and Go performed: completed; within normal timeframe   Depression Screen PHQ 2/9 Scores 11/16/2018 05/18/2018 11/21/2017 05/22/2017  PHQ - 2 Score 0 0 0 0  PHQ- 9 Score 1 1 0 -     Cognitive Function    No cognitive concerns at this time      Immunization History  Administered Date(s) Administered  . Influenza, High Dose Seasonal PF 04/20/2018  . Influenza,inj,Quad PF,6+ Mos 05/22/2017  . Influenza-Unspecified 04/17/2014  . Pneumococcal Conjugate-13 07/28/2015  .  Pneumococcal Polysaccharide-23 07/14/2005, 11/21/2017  . Zoster 12/23/2011    Qualifies for Shingles Vaccine? Discussed and patient will check with pharmacy for coverage.  Patient education handout provided    Screening Tests Health Maintenance  Topic Date Due  . INFLUENZA VACCINE  01/02/2019  . COLONOSCOPY  07/12/2019  . MAMMOGRAM  11/18/2019  . TETANUS/TDAP  05/15/2022  . DEXA SCAN  Completed  . Hepatitis C Screening  Completed  . PNA vac Low Risk Adult  Completed    Cancer Screenings: Breast:  Up to date on Mammogram? Yes; last 11/18/18   Up to date of Bone Density/Dexa? Yes; last 11/18/18 Colorectal: completed 07/11/14      Plan:    I have personally reviewed and addressed the Medicare Annual Wellness questionnaire and have noted the following in the patient's chart:  A. Medical and social history B. Use of alcohol, tobacco or illicit drugs  C. Current medications and supplements D. Functional ability and status E.  Nutritional status F.  Physical activity G. Advance directives H. List of other physicians I.  Hospitalizations, surgeries, and ER visits in previous 12 months J.  [place] such as hearing and vision if needed, cognitive and depression L. Referrals, records requested, and appointments- none   In addition, I have reviewed and discussed with patient certain preventive protocols, quality metrics, and best practice recommendations. A written personalized care plan for preventive services as well as general preventive health recommendations were provided to patient.   Signed,  Denman George, LPN  Nurse Health Advisor   Nurse Notes: no additions

## 2019-01-19 NOTE — Patient Instructions (Signed)
Olivia Bender , Thank you for taking time to come for your Medicare Wellness Visit. I appreciate your ongoing commitment to your health goals. Please review the following plan we discussed and let me know if I can assist you in the future.   Screening recommendations/referrals: Colorectal Screening: completed  Mammogram: completed 11/18/18  Bone Density: completed 11/18/18  Vision and Dental Exams: Recommended annual ophthalmology exams for early detection of glaucoma and other disorders of the eye Recommended annual dental exams for proper oral hygiene  Vaccinations: Influenza vaccine:  recommended this fall either at PCP office or through your local pharmacy  Pneumococcal vaccine: completed  Tdap vaccine: recommended  Shingles vaccine: Please call your insurance company to determine your out of pocket expense for the Shingrix vaccine. You may receive this vaccine at your local pharmacy.  Advanced directives:  Please bring a copy of your POA (Power of Attorney) and/or Living Will to your next appointment.  Goals: Recommend to decrease portion sizes by eating 3 small healthy meals and at least 2 healthy snacks per day.  Next appointment: Please schedule your Annual Wellness Visit with your Nurse Health Advisor in one year.  Preventive Care 85 Years and Older, Female Preventive care refers to lifestyle choices and visits with your health care provider that can promote health and wellness. What does preventive care include?  A yearly physical exam. This is also called an annual well check.  Dental exams once or twice a year.  Routine eye exams. Ask your health care provider how often you should have your eyes checked.  Personal lifestyle choices, including:  Daily care of your teeth and gums.  Regular physical activity.  Eating a healthy diet.  Avoiding tobacco and drug use.  Limiting alcohol use.  Practicing safe sex.  Taking low-dose aspirin every day if recommended by your  health care provider.  Taking vitamin and mineral supplements as recommended by your health care provider. What happens during an annual well check? The services and screenings done by your health care provider during your annual well check will depend on your age, overall health, lifestyle risk factors, and family history of disease. Counseling  Your health care provider may ask you questions about your:  Alcohol use.  Tobacco use.  Drug use.  Emotional well-being.  Home and relationship well-being.  Sexual activity.  Eating habits.  History of falls.  Memory and ability to understand (cognition).  Work and work Statistician.  Reproductive health. Screening  You may have the following tests or measurements:  Height, weight, and BMI.  Blood pressure.  Lipid and cholesterol levels. These may be checked every 5 years, or more frequently if you are over 31 years old.  Skin check.  Lung cancer screening. You may have this screening every year starting at age 44 if you have a 30-pack-year history of smoking and currently smoke or have quit within the past 15 years.  Fecal occult blood test (FOBT) of the stool. You may have this test every year starting at age 3.  Flexible sigmoidoscopy or colonoscopy. You may have a sigmoidoscopy every 5 years or a colonoscopy every 10 years starting at age 50.  Hepatitis C blood test.  Hepatitis B blood test.  Sexually transmitted disease (STD) testing.  Diabetes screening. This is done by checking your blood sugar (glucose) after you have not eaten for a while (fasting). You may have this done every 1-3 years.  Bone density scan. This is done to screen for osteoporosis. You  may have this done starting at age 63.  Mammogram. This may be done every 1-2 years. Talk to your health care provider about how often you should have regular mammograms. Talk with your health care provider about your test results, treatment options, and if  necessary, the need for more tests. Vaccines  Your health care provider may recommend certain vaccines, such as:  Influenza vaccine. This is recommended every year.  Tetanus, diphtheria, and acellular pertussis (Tdap, Td) vaccine. You may need a Td booster every 10 years.  Zoster vaccine. You may need this after age 64.  Pneumococcal 13-valent conjugate (PCV13) vaccine. One dose is recommended after age 24.  Pneumococcal polysaccharide (PPSV23) vaccine. One dose is recommended after age 69. Talk to your health care provider about which screenings and vaccines you need and how often you need them. This information is not intended to replace advice given to you by your health care provider. Make sure you discuss any questions you have with your health care provider. Document Released: 06/16/2015 Document Revised: 02/07/2016 Document Reviewed: 03/21/2015 Elsevier Interactive Patient Education  2017 [place] Prevention in the Home Falls can cause injuries. They can happen to people of all ages. There are many things you can do to make your home safe and to help prevent falls. What can I do on the outside of my home?  Regularly fix the edges of walkways and driveways and fix any cracks.  Remove anything that might make you trip as you walk through a door, such as a raised step or threshold.  Trim any bushes or trees on the path to your home.  Use bright outdoor lighting.  Clear any walking paths of anything that might make someone trip, such as rocks or tools.  Regularly check to see if handrails are loose or broken. Make sure that both sides of any steps have handrails.  Any raised decks and porches should have guardrails on the edges.  Have any leaves, snow, or ice cleared regularly.  Use sand or salt on walking paths during winter.  Clean up any spills in your garage right away. This includes oil or grease spills. What can I do in the bathroom?  Use night lights.   Install grab bars by the toilet and in the tub and shower. Do not use towel bars as grab bars.  Use non-skid mats or decals in the tub or shower.  If you need to sit down in the shower, use a plastic, non-slip stool.  Keep the floor dry. Clean up any water that spills on the floor as soon as it happens.  Remove soap buildup in the tub or shower regularly.  Attach bath mats securely with double-sided non-slip rug tape.  Do not have throw rugs and other things on the floor that can make you trip. What can I do in the bedroom?  Use night lights.  Make sure that you have a light by your bed that is easy to reach.  Do not use any sheets or blankets that are too big for your bed. They should not hang down onto the floor.  Have a firm chair that has side arms. You can use this for support while you get dressed.  Do not have throw rugs and other things on the floor that can make you trip. What can I do in the kitchen?  Clean up any spills right away.  Avoid walking on wet floors.  Keep items that you use a lot  in easy-to-reach places.  If you need to reach something above you, use a strong step stool that has a grab bar.  Keep electrical cords out of the way.  Do not use floor polish or wax that makes floors slippery. If you must use wax, use non-skid floor wax.  Do not have throw rugs and other things on the floor that can make you trip. What can I do with my stairs?  Do not leave any items on the stairs.  Make sure that there are handrails on both sides of the stairs and use them. Fix handrails that are broken or loose. Make sure that handrails are as long as the stairways.  Check any carpeting to make sure that it is firmly attached to the stairs. Fix any carpet that is loose or worn.  Avoid having throw rugs at the top or bottom of the stairs. If you do have throw rugs, attach them to the floor with carpet tape.  Make sure that you have a light switch at the top of the  stairs and the bottom of the stairs. If you do not have them, ask someone to add them for you. What else can I do to help prevent falls?  Wear shoes that:  Do not have high heels.  Have rubber bottoms.  Are comfortable and fit you well.  Are closed at the toe. Do not wear sandals.  If you use a stepladder:  Make sure that it is fully opened. Do not climb a closed stepladder.  Make sure that both sides of the stepladder are locked into place.  Ask someone to hold it for you, if possible.  Clearly mark and make sure that you can see:  Any grab bars or handrails.  First and last steps.  Where the edge of each step is.  Use tools that help you move around (mobility aids) if they are needed. These include:  Canes.  Walkers.  Scooters.  Crutches.  Turn on the lights when you go into a dark area. Replace any light bulbs as soon as they burn out.  Set up your furniture so you have a clear path. Avoid moving your furniture around.  If any of your floors are uneven, fix them.  If there are any pets around you, be aware of where they are.  Review your medicines with your doctor. Some medicines can make you feel dizzy. This can increase your chance of falling. Ask your doctor what other things that you can do to help prevent falls. This information is not intended to replace advice given to you by your health care provider. Make sure you discuss any questions you have with your health care provider. Document Released: 03/16/2009 Document Revised: 10/26/2015 Document Reviewed: 06/24/2014 Elsevier Interactive Patient Education  2017 [employer].

## 2019-01-19 NOTE — Progress Notes (Signed)
I have reviewed and agree with note, evaluation, plan.   Avrielle Fry, MD  

## 2019-01-21 ENCOUNTER — Ambulatory Visit: Payer: Medicare Other | Admitting: Internal Medicine

## 2019-03-02 ENCOUNTER — Ambulatory Visit: Payer: Medicare Other

## 2019-03-04 DIAGNOSIS — Z23 Encounter for immunization: Secondary | ICD-10-CM | POA: Diagnosis not present

## 2019-03-09 DIAGNOSIS — E785 Hyperlipidemia, unspecified: Secondary | ICD-10-CM | POA: Diagnosis not present

## 2019-03-09 LAB — LIPID PANEL
Chol/HDL Ratio: 3.2 ratio (ref 0.0–4.4)
Cholesterol, Total: 168 mg/dL (ref 100–199)
HDL: 53 mg/dL (ref >39)
LDL Chol Calc (NIH): 90 mg/dL (ref 0–99)
Triglycerides: 143 mg/dL (ref 0–149)
VLDL Cholesterol Cal: 25 mg/dL (ref 5–40)

## 2019-03-15 ENCOUNTER — Ambulatory Visit (INDEPENDENT_AMBULATORY_CARE_PROVIDER_SITE_OTHER): Payer: Medicare Other | Admitting: Internal Medicine

## 2019-03-15 ENCOUNTER — Other Ambulatory Visit: Payer: Self-pay

## 2019-03-15 ENCOUNTER — Encounter: Payer: Self-pay | Admitting: Internal Medicine

## 2019-03-15 VITALS — BP 139/87 | HR 90 | Ht 60.0 in | Wt 177.0 lb

## 2019-03-15 DIAGNOSIS — E785 Hyperlipidemia, unspecified: Secondary | ICD-10-CM

## 2019-03-15 DIAGNOSIS — R931 Abnormal findings on diagnostic imaging of heart and coronary circulation: Secondary | ICD-10-CM | POA: Diagnosis not present

## 2019-03-15 DIAGNOSIS — Z8249 Family history of ischemic heart disease and other diseases of the circulatory system: Secondary | ICD-10-CM

## 2019-03-15 NOTE — Patient Instructions (Signed)
Medication Instructions:  Dr Debara Pickett recommends that you continue on your current medications as directed. Please refer to the Current Medication list given to you today.  If you need a refill on your cardiac medications before your next appointment, please call your pharmacy.   Lab work: Your physician recommends that you return for lab work in 6 months - FASTING.  If you have labs (blood work) drawn today and your tests are completely normal, you will receive your results only by: Marland Kitchen MyChart Message (if you have MyChart) OR . A paper copy in the mail If you have any lab test that is abnormal or we need to change your treatment, we will call you to review the results.  Follow-Up: At [facility], you and your health needs are our priority.  As part of our continuing mission to provide you with exceptional heart care, we have created designated Provider Care Teams.  These Care Teams include your primary Cardiologist (physician) and Advanced Practice Providers (APPs -  Physician Assistants and Nurse Practitioners) who all work together to provide you with the care you need, when you need it. You will need a follow up appointment in 6 months.  Please call our office 2 months in advance to schedule this appointment.  You may see Pixie Casino, MD or one of the following Advanced Practice Providers on your designated Care Team: [place], [place] . Fabian Sharp, PA-C . You will receive a reminder letter in the mail two months in advance. If you don't receive a letter, please call our office to schedule the follow-up appointment.

## 2019-03-16 ENCOUNTER — Encounter: Payer: Self-pay | Admitting: Internal Medicine

## 2019-03-16 NOTE — Progress Notes (Signed)
LIPID CLINIC CONSULT NOTE  Chief Complaint:  Follow-up dyslipidemia  Primary Care Physician: Marin Olp, MD  HPI:  Olivia Bender is a 68 y.o. female who is being seen today for the evaluation of dyslipidemia at the request of Marin Olp, MD.  This is a pleasant 68 year old female with past medical history significant for dyslipidemia, hypothyroidism, anxiety, headaches and some osteopenia.  She had an episode of chest discomfort in 2007 and underwent nuclear stress testing which was negative but otherwise has had no other cardiac testing.  She does have a longstanding history of dyslipidemia but also significant obesity.  She reports she is lost 46 pounds over the last year.  Weight now is 164 with a BMI of 32.  Her blood pressures at goal today 128/82.  Most recent lipid profile after weight loss however actually showed an increase in LDL cholesterol.  Her total cholesterol was 275, triglycerides 107, HDL 52 and LDL 200.  LDL appears to have consistently run between 170 and 200 suggesting possible familial dyslipidemia.  Her [place] lipid score is 4 suggesting possible familial hyperlipidemia.  There is a history of heart disease mostly on her mom's side and her aunt maternal grandmother and grandfather and mom who had A. fib and stroke although she is not aware of significant dyslipidemia in these relatives.  She also has a sister who does not have any significant elevation in cholesterol that she is aware of in 2 children, neither of which have high cholesterol to her knowledge.  Unfortunately she also has statin intolerance.  She is previously been tried on Lipitor, Crestor, Livalo, Zetia and even reduced dose Crestor every other day and once weekly without tolerance due to significant myalgias and cramping of the lower extremities which resolved after discontinuing the medications.  03/14/2021  Olivia Bender returns today for follow-up of dyslipidemia.  She is successfully using  Repatha and denies any significant side effects with it.  She has had marked reduction in her cholesterol.  Her LDL is decreased from 200-90.  Most recent lipid profile showed total cholesterol 168, triglycerides 243, HDL 53 and LDL of 90.  This represents greater than 50% reduction in her LDL.  PMHx:  Past Medical History:  Diagnosis Date  . Allergy   . Anxiety   . Arthritis   . Constipation   . Enlarged liver   . Generalized headaches    since teenage years- usually can get away with tylenol  . Hyperlipidemia   . Osteopenia   . Thyroid disease    hypothyroidism    Past Surgical History:  Procedure Laterality Date  . arthroscopic left knee surgery    . DILATION AND CURETTAGE OF UTERUS     monthlong period related. everything was ok  . NASAL SINUS SURGERY  1986    FAMHx:  Family History  Problem Relation Age of Onset  . Stroke Mother        12. never smoker  . Hypertension Mother   . Other Father        killed in car wreck 2012  . Prostate cancer Father   . Obesity Sister   . Kidney cancer Son   . Heart disease Maternal Grandmother   . Heart disease Maternal Grandfather     SOCHx:   reports that she has never smoked. She has never used smokeless tobacco. She reports that she does not drink alcohol or use drugs.  ALLERGIES:  Allergies  Allergen Reactions  .  Tetanus Toxoids Anaphylaxis  . Latex Rash  . Ezetimibe Other (See Comments)    Myalgia  . Statins Other (See Comments)    Lipitor, Crestor, Livalo -myalgias    ROS: Pertinent items noted in HPI and remainder of comprehensive ROS otherwise negative.  HOME MEDS: Current Outpatient Medications on File Prior to Visit  Medication Sig Dispense Refill  . aspirin 81 MG EC tablet Take 81 mg by mouth daily.      . Azelastine HCl 137 MCG/SPRAY SOLN Place into the nose.    . cyclobenzaprine (FLEXERIL) 10 MG tablet Take 10 mg by mouth 3 (three) times daily as needed.      Arna Medici 75 MCG tablet Take 1 tablet by  mouth once daily 90 tablet 1  . Evolocumab (REPATHA SURECLICK) [id number] MG/ML SOAJ Inject 140 mg into the skin every 14 (fourteen) days. 2 pen 6  . levocetirizine (XYZAL) 5 MG tablet Take 5 mg by mouth every evening. Prn only    . sertraline (ZOLOFT) 50 MG tablet TAKE ONE TABLET BY MOUTH ONCE DAILY 90 tablet 3  . Spacer/Aero-Holding Chambers (AEROCHAMBER PLUS FLO-VU) MISC See admin instructions.  1  . triamcinolone (NASACORT ALLERGY 24HR) 55 MCG/ACT AERO nasal inhaler Place 2 sprays into the nose daily.     No current facility-administered medications on file prior to visit.     LABS/IMAGING: No results found for this or any previous visit (from the past 48 hour(s)). No results found.  LIPID PANEL:    Component Value Date/Time   CHOL 168 03/09/2019 1035   TRIG 143 03/09/2019 1035   HDL 53 03/09/2019 1035   CHOLHDL 3.2 03/09/2019 1035   CHOLHDL 5 05/18/2018 0936   VLDL 21.4 05/18/2018 0936   LDLCALC 90 03/09/2019 1035   LDLCALC 184 (H) 05/22/2017 1045   LDLDIRECT 184.0 11/21/2017 1106    WEIGHTS: Wt Readings from Last 3 Encounters:  03/15/19 177 lb (80.3 kg)  01/19/19 171 lb (77.6 kg)  11/16/18 163 lb (73.9 kg)    VITALS: BP 139/87   Pulse 90   Ht 5' (1.524 m)   Wt 177 lb (80.3 kg)   SpO2 100%   BMI 34.57 kg/m   EXAM: Deferred  EKG: Deferred  ASSESSMENT: 1. Elevated LDL cholesterol 2. Dutch score 4 -possible FH 3. Family history of coronary disease 4. Statin intolerance 5. CAC score of 7.  PLAN: 1.   Ms. Bender has had significant reduction in her dyslipidemia with more than 50% reduction on PCSK9 inhibitor.  She is tolerating this well without any significant side effects.  She had a low CAC score of 7 in February 2020.  At this point as she is event free, would not aggressively target her to LDL less than 70.  I would continue to encourage dietary modification, increased exercise and weight loss.  Plan to continue her current therapy and will reassess her lipids  in about 6 months.  Pixie Casino, MD, [facility], [place] Director of the Advanced Lipid Disorders &  Cardiovascular Risk Reduction Clinic Diplomate of the American Board of Clinical Lipidology Attending Cardiologist  Direct Dial: [phone]  Fax: [phone]  Website:  [email or web address] 03/16/2019, 5:09 PM

## 2019-06-07 ENCOUNTER — Other Ambulatory Visit: Payer: Self-pay

## 2019-06-07 ENCOUNTER — Encounter: Payer: Self-pay | Admitting: Family Medicine

## 2019-06-07 ENCOUNTER — Ambulatory Visit (INDEPENDENT_AMBULATORY_CARE_PROVIDER_SITE_OTHER): Payer: Medicare Other | Admitting: Family Medicine

## 2019-06-07 DIAGNOSIS — R05 Cough: Secondary | ICD-10-CM | POA: Diagnosis not present

## 2019-06-07 DIAGNOSIS — R059 Cough, unspecified: Secondary | ICD-10-CM

## 2019-06-07 MED ORDER — GUAIFENESIN-CODEINE 100-10 MG/5ML PO SOLN: 5.0000 mL | Freq: Four times a day (QID) | ORAL | 0 refills | Status: DC | PRN

## 2019-06-07 NOTE — Patient Instructions (Signed)
There are no preventive care reminders to display for this patient.  Depression screen [facility] 2/9 11/16/2018 05/18/2018 11/21/2017  Decreased Interest 0 0 0  Down, Depressed, Hopeless 0 0 0  PHQ - 2 Score 0 0 0  Altered sleeping 0 0 0  Tired, decreased energy 0 1 0  Change in appetite 1 0 0  Feeling bad or failure about yourself  0 0 0  Trouble concentrating 0 0 0  Moving slowly or fidgety/restless 0 0 0  Suicidal thoughts 0 0 0  PHQ-9 Score 1 1 0  Difficult doing work/chores Not difficult at all Not difficult at all -    Recommended follow up: No follow-ups on file.

## 2019-06-07 NOTE — Progress Notes (Addendum)
Phone [phone] Virtual visit via phonenote   Subjective:   Chief Complaint  Patient presents with  . COVID-19 symptoms   This visit type was conducted due to national recommendations for restrictions regarding the COVID-19 Pandemic (e.g. social distancing).  This format is felt to be most appropriate for this patient at this time balancing risks to patient and risks to population by having him in for in person visit.  All issues noted in this document were discussed and addressed.  No physical exam was performed (except for noted visual exam or audio findings with Telehealth visits).  The patient has consented to conduct a Telehealth visit and understands insurance will be billed.   Our team/I connected with Loreley P Cottier at 11:40 AM EST by phone (patient did not have equipment for webex) and verified that I am speaking with the correct person using two identifiers.  Location patient: [facility] Location provider: [facility], office Persons participating in the virtual visit:  patient  Time on phone: 7 minutes Counseling provided about covid 19  Our team/I discussed the limitations of evaluation and management by telemedicine and the availability of in person appointments. In light of current covid-19 pandemic, patient also understands that we are trying to protect them by minimizing in office contact if at all possible.  The patient expressed consent for telemedicine visit and agreed to proceed. Patient understands insurance will be billed.   ROS- has had fever, chills, low appetite   Past Medical History-  Patient Active Problem List   Diagnosis Date Noted  . Major depression in full remission ([place]) 05/18/2018    Priority: Medium  . Asthma 11/21/2017    Priority: Medium  . Generalized headaches     Priority: Medium  . Fatty infiltration of liver 12/30/2014    Priority: Medium  . Prediabetes 06/30/2014    Priority: Medium  . Hypothyroidism 12/01/2010    Priority: Medium  .  Hyperlipidemia 12/01/2010    Priority: Medium  . Osteopenia 12/01/2010    Priority: Medium  . GE reflux 12/01/2010    Priority: Medium  . Trochanteric bursitis of left hip 12/11/2015    Priority: Low  . Statin intolerance 06/30/2014    Priority: Low  . Left knee pain 06/30/2014    Priority: Low  . Allergic rhinitis 12/01/2010    Priority: Low  . Aortic atherosclerosis ([place]) 12/11/2018    Medications- reviewed and updated Current Outpatient Medications  Medication Sig Dispense Refill  . aspirin 81 MG EC tablet Take 81 mg by mouth daily.      . Azelastine HCl 137 MCG/SPRAY SOLN Place into the nose.    . cyclobenzaprine (FLEXERIL) 10 MG tablet Take 10 mg by mouth 3 (three) times daily as needed.      Arna Medici 75 MCG tablet Take 1 tablet by mouth once daily 90 tablet 1  . Evolocumab (REPATHA SURECLICK) [id number] MG/ML SOAJ Inject 140 mg into the skin every 14 (fourteen) days. 2 pen 6  . levocetirizine (XYZAL) 5 MG tablet Take 5 mg by mouth every evening. Prn only    . omeprazole (PRILOSEC OTC) 20 MG tablet Take 20 mg by mouth daily.    . sertraline (ZOLOFT) 50 MG tablet TAKE ONE TABLET BY MOUTH ONCE DAILY 90 tablet 3  . Spacer/Aero-Holding Chambers (AEROCHAMBER PLUS FLO-VU) MISC See admin instructions.  1  . triamcinolone (NASACORT ALLERGY 24HR) 55 MCG/ACT AERO nasal inhaler Place 2 sprays into the nose daily.    Marland Kitchen guaiFENesin-codeine 100-10 MG/5ML  syrup Take 5 mLs by mouth every 6 (six) hours as needed for cough. 120 mL 0   No current facility-administered medications for this visit.     Objective:  no self reported vitals - denies feeling febrile today Nonlabored voice, normal speech      Assessment and Plan   # Concern for covid 19/cough/fever S:symptoms started last Monday night. Had a family meal night before. Bad headache and some eye sensitivity (advised eye visit due to slight blurry vision). She has had fever and chills particularly at beginning of illness.  Low appetite  and extreme fatigue.   For allergies on nasacort and astelin. Has had to use albuterol some 3x in last week- has had some shortness of breath. Not wheezing much.   Coughing up some phlegm at times. Seems to be coughing a lot. Taking mucinex helps somee.  A/P: Patient with symptoms concerning for potential covid 19 Therefore: - codeine cough syrup sent in for symptomatic care.  Advised not to take Mucinex with this.  Offered Tessalon initially but she states this has not worked in the past.  Discussed only using codeine at night before bed to help her sleep-otherwise typically I do not like to suppress cough with asthma - testing ordered for covid 19  -information provided on testing scheduling "please text "COVID" to [phone], OR you can log on to [email or web address] to easily make an on-line appointment. "  - recommended patient watch closely for shortness of breath or confusion or worsening symptoms and if those occur he should contact us immediately  -recommended patient consider purchasing pulse oximeter and if levels 92% (she is usually 94-96%)or below persistently- seek care at the hospital  -recommended self isolation until negative test  at minimum . Also discussed potential for false negatives and to still be cautious even with negative test  - for quarantine if covid 19 test positive would need to be at least 10 days since first symptom AND at least 24 hours fever free without fever reducing medications AND improvement in respiratory symptoms  - we also discussed close contacts would need 14 day quarantine after last close contact with patient IF patient is positive  -Hopeful results back within 48 hours of test but may take up to a week  - work note written (see lett  Lab/Order associations:   ICD-10-CM   1. Cough  R05 Novel Coronavirus, NAA ([facility])    Meds ordered this encounter  Medications  . guaiFENesin-codeine 100-10 MG/5ML syrup    Sig: Take 5 mLs by mouth every 6 (six)  hours as needed for cough.    Dispense:  120 mL    Refill:  0    Return precautions advised.  Garret Reddish, MD

## 2019-06-08 ENCOUNTER — Encounter (HOSPITAL_COMMUNITY): Payer: Self-pay | Admitting: *Deleted

## 2019-06-08 ENCOUNTER — Emergency Department (HOSPITAL_COMMUNITY)
Admission: EM | Admit: 2019-06-08 | Discharge: 2019-06-09 | Disposition: A | Discharge status: Home / Self Care | Payer: Medicare Other | Attending: Emergency Medicine | Admitting: Emergency Medicine

## 2019-06-08 ENCOUNTER — Other Ambulatory Visit: Payer: Self-pay

## 2019-06-08 DIAGNOSIS — Z79899 Other long term (current) drug therapy: Secondary | ICD-10-CM | POA: Diagnosis not present

## 2019-06-08 DIAGNOSIS — Z7982 Long term (current) use of aspirin: Secondary | ICD-10-CM | POA: Diagnosis not present

## 2019-06-08 DIAGNOSIS — Z91040 Latex allergy status: Secondary | ICD-10-CM | POA: Diagnosis not present

## 2019-06-08 DIAGNOSIS — E039 Hypothyroidism, unspecified: Secondary | ICD-10-CM | POA: Insufficient documentation

## 2019-06-08 DIAGNOSIS — J45909 Unspecified asthma, uncomplicated: Secondary | ICD-10-CM | POA: Insufficient documentation

## 2019-06-08 DIAGNOSIS — U071 COVID-19: Secondary | ICD-10-CM | POA: Diagnosis not present

## 2019-06-08 DIAGNOSIS — R509 Fever, unspecified: Secondary | ICD-10-CM | POA: Diagnosis not present

## 2019-06-08 DIAGNOSIS — R05 Cough: Secondary | ICD-10-CM | POA: Diagnosis present

## 2019-06-08 NOTE — ED Triage Notes (Signed)
Pt brought in by [facility] for c/o sob, cough and chills over one week; pt called her PCP and was told she probably has Covid and was told to quarantine; pt states her sx have gotten worse

## 2019-06-09 ENCOUNTER — Telehealth: Payer: Self-pay | Admitting: Family Medicine

## 2019-06-09 ENCOUNTER — Emergency Department (HOSPITAL_COMMUNITY): Payer: Medicare Other

## 2019-06-09 DIAGNOSIS — R05 Cough: Secondary | ICD-10-CM | POA: Diagnosis not present

## 2019-06-09 DIAGNOSIS — U071 COVID-19: Secondary | ICD-10-CM | POA: Diagnosis not present

## 2019-06-09 DIAGNOSIS — R509 Fever, unspecified: Secondary | ICD-10-CM | POA: Diagnosis not present

## 2019-06-09 LAB — SARS CORONAVIRUS 2 (TAT 6-24 HRS): SARS Coronavirus 2: POSITIVE — AB

## 2019-06-09 NOTE — Discharge Instructions (Addendum)
Isolate at home until your Covid result is known.  If this test is positive, remain at home and stay away from others until you feel better +1-week.  Drink plenty of fluids and get plenty of rest.  Return to the emergency department for severe chest pain, difficulty breathing, or other new and concerning symptoms.

## 2019-06-09 NOTE — ED Provider Notes (Signed)
[facility] EMERGENCY DEPARTMENT Provider Note   CSN: [id number] Arrival date & time: 06/08/19  1729     History Chief Complaint  Patient presents with  . Cough    Olivia Bender is a 69 y.o. female.  Patient is a 69 year old female with past medical history hyperlipidemia, osteopenia, arthritis.  She presents today for evaluation of body aches, chest congestion, cough, and generally feeling unwell for the past several days.  Her husband has very similar symptoms and was diagnosed earlier with COVID-19.  He was just admitted to the hospital for hypoxia.  The history is provided by the patient.  Cough Cough characteristics:  Non-productive Severity:  Moderate Onset quality:  Gradual Duration:  3 days Timing:  Constant Progression:  Worsening Chronicity:  New Relieved by:  Nothing Worsened by:  Nothing Ineffective treatments:  Cough suppressants Associated symptoms: fever, headaches and myalgias        Past Medical History:  Diagnosis Date  . Allergy   . Anxiety   . Arthritis   . Constipation   . Enlarged liver   . Generalized headaches    since teenage years- usually can get away with tylenol  . Hyperlipidemia   . Osteopenia   . Thyroid disease    hypothyroidism    Patient Active Problem List   Diagnosis Date Noted  . Aortic atherosclerosis ([place]) 12/11/2018  . Major depression in full remission ([place]) 05/18/2018  . Asthma 11/21/2017  . Generalized headaches   . Trochanteric bursitis of left hip 12/11/2015  . Fatty infiltration of liver 12/30/2014  . Statin intolerance 06/30/2014  . Left knee pain 06/30/2014  . Prediabetes 06/30/2014  . Hypothyroidism 12/01/2010  . Hyperlipidemia 12/01/2010  . Osteopenia 12/01/2010  . Allergic rhinitis 12/01/2010  . GE reflux 12/01/2010    Past Surgical History:  Procedure Laterality Date  . arthroscopic left knee surgery    . DILATION AND CURETTAGE OF UTERUS     monthlong period related. everything was ok  . NASAL  SINUS SURGERY  1986     OB History   No obstetric history on file.     Family History  Problem Relation Age of Onset  . Stroke Mother        66. never smoker  . Hypertension Mother   . Other Father        killed in car wreck 2012  . Prostate cancer Father   . Obesity Sister   . Kidney cancer Son   . Heart disease Maternal Grandmother   . Heart disease Maternal Grandfather     Social History   Tobacco Use  . Smoking status: Never Smoker  . Smokeless tobacco: Never Used  Substance Use Topics  . Alcohol use: No  . Drug use: Never    Home Medications Prior to Admission medications   Medication Sig Start Date End Date Taking? Authorizing Provider  aspirin 81 MG EC tablet Take 81 mg by mouth daily.      [provider]  Azelastine HCl 137 MCG/SPRAY SOLN Place into the nose.    [provider]  cyclobenzaprine (FLEXERIL) 10 MG tablet Take 10 mg by mouth 3 (three) times daily as needed.      [provider]  EUTHYROX 75 MCG tablet Take 1 tablet by mouth once daily 12/23/18   Marin Olp, MD  Evolocumab (REPATHA SURECLICK) XX123456 MG/ML SOAJ Inject 140 mg into the skin every 14 (fourteen) days. 12/10/18   Hilty, Nadean Corwin, MD  guaiFENesin-codeine 100-10 MG/5ML syrup Take 5 mLs by mouth every 6 (six) hours as needed for cough. 06/07/19   Marin Olp, MD  levocetirizine (XYZAL) 5 MG tablet Take 5 mg by mouth every evening. Prn only    [provider]  omeprazole (PRILOSEC OTC) 20 MG tablet Take 20 mg by mouth daily.    [provider]  sertraline (ZOLOFT) 50 MG tablet TAKE ONE TABLET BY MOUTH ONCE DAILY 08/11/18   Marin Olp, MD  Spacer/Aero-Holding Chambers (AEROCHAMBER PLUS FLO-VU) MISC See admin instructions. 05/16/14   [provider]  triamcinolone (NASACORT ALLERGY 24HR) 55 MCG/ACT AERO nasal inhaler Place 2 sprays into the nose daily.    [provider]    Allergies    Tetanus toxoids, Latex,  Ezetimibe, and Statins  Review of Systems   Review of Systems  Constitutional: Positive for fever.  Respiratory: Positive for cough.   Musculoskeletal: Positive for myalgias.  Neurological: Positive for headaches.  All other systems reviewed and are negative.   Physical Exam Updated Vital Signs BP 129/73 (BP Location: Right Arm)   Temp 98.1 F (36.7 C) (Oral)   Resp 18   Ht 5\' 1"  (1.549 m)   Wt 79.4 kg   SpO2 96%   BMI 33.07 kg/m   Physical Exam Vitals and nursing note reviewed.  Constitutional:      General: She is not in acute distress.    Appearance: She is well-developed. She is not diaphoretic.  HENT:     Head: Normocephalic and atraumatic.  Cardiovascular:     Rate and Rhythm: Normal rate and regular rhythm.     Heart sounds: No murmur. No friction rub. No gallop.   Pulmonary:     Effort: Pulmonary effort is normal. No respiratory distress.     Breath sounds: Normal breath sounds. No wheezing.  Abdominal:     General: Bowel sounds are normal. There is no distension.     Palpations: Abdomen is soft.     Tenderness: There is no abdominal tenderness.  Musculoskeletal:        General: Normal range of motion.     Cervical back: Normal range of motion and neck supple.  Skin:    General: Skin is warm and dry.  Neurological:     Mental Status: She is alert and oriented to person, place, and time.     ED Results / Procedures / Treatments   Labs (all labs ordered are listed, but only abnormal results are displayed) Labs Reviewed  SARS CORONAVIRUS 2 (TAT 6-24 HRS)    EKG None  Radiology No results found.  Procedures Procedures (including critical care time)  Medications Ordered in ED Medications - No data to display  ED Course  I have reviewed the triage vital signs and the nursing notes.  Pertinent labs & imaging results that were available during my care of the patient were reviewed by me and considered in my medical decision making (see chart for  details).    MDM Rules/Calculators/A&P  Patient presenting here with complaints of URI like symptoms.  Her husband was diagnosed today and admitted for COVID-19.  I strongly feel as though the patient has the same diagnosis as her husband.  Patient is not hypoxic and vital signs are stable.  Chest x-ray does suggest possibly developing pneumonia, but she otherwise clinically appears well.  A send out Covid test will be obtained and patient will be discharged to home.  She is to return as  needed if she becomes short of breath or develops other problems.  Final Clinical Impression(s) / ED Diagnoses Final diagnoses:  None    Rx / DC Orders ED Discharge Orders    None       Veryl Speak, MD 06/09/19 [phone]

## 2019-06-09 NOTE — Telephone Encounter (Signed)
ERROR

## 2019-06-21 ENCOUNTER — Other Ambulatory Visit: Payer: Self-pay | Admitting: Family Medicine

## 2019-06-22 NOTE — Telephone Encounter (Signed)
Pharmacy requesting rx refill:  Codeine-Guaifen 10-100 mg/76ml  Take 5 mls PO every 6 hrs PRN for cough  Approve?

## 2019-06-25 ENCOUNTER — Telehealth: Payer: Self-pay

## 2019-06-25 DIAGNOSIS — J4520 Mild intermittent asthma, uncomplicated: Secondary | ICD-10-CM | POA: Diagnosis not present

## 2019-06-25 DIAGNOSIS — K219 Gastro-esophageal reflux disease without esophagitis: Secondary | ICD-10-CM | POA: Diagnosis not present

## 2019-06-25 DIAGNOSIS — H1045 Other chronic allergic conjunctivitis: Secondary | ICD-10-CM | POA: Diagnosis not present

## 2019-06-25 DIAGNOSIS — J310 Chronic rhinitis: Secondary | ICD-10-CM | POA: Diagnosis not present

## 2019-06-25 NOTE — Telephone Encounter (Signed)
We have euthyrox 75 mcg- did someone change this dose up to 100 mcg? I tried to do a brief look in chart and didn't see where this change occured

## 2019-06-25 NOTE — Telephone Encounter (Signed)
Pt's Pharmacy requesting Levothyroxine 158mcg.   LOV: 06/07/2019 Next OV: Nothing scheduled   I did not see medication on her list. Okay to refill?

## 2019-06-28 ENCOUNTER — Telehealth: Payer: Self-pay | Admitting: Family Medicine

## 2019-06-28 ENCOUNTER — Other Ambulatory Visit: Payer: Self-pay

## 2019-06-28 DIAGNOSIS — H1045 Other chronic allergic conjunctivitis: Secondary | ICD-10-CM | POA: Diagnosis not present

## 2019-06-28 DIAGNOSIS — J310 Chronic rhinitis: Secondary | ICD-10-CM | POA: Diagnosis not present

## 2019-06-28 DIAGNOSIS — K219 Gastro-esophageal reflux disease without esophagitis: Secondary | ICD-10-CM | POA: Diagnosis not present

## 2019-06-28 DIAGNOSIS — E039 Hypothyroidism, unspecified: Secondary | ICD-10-CM

## 2019-06-28 DIAGNOSIS — J4520 Mild intermittent asthma, uncomplicated: Secondary | ICD-10-CM | POA: Diagnosis not present

## 2019-06-28 MED ORDER — LEVOTHYROXINE SODIUM 75 MCG PO TABS: 75.0000 ug | ORAL_TABLET | Freq: Every day | ORAL | 3 refills | Status: DC

## 2019-06-28 NOTE — Telephone Encounter (Signed)
Levothyroxine 83mcg sent in and lab ordered, Euthyrox taken off list. Called and lm for pt tcb to schedule lab visit.

## 2019-06-28 NOTE — Telephone Encounter (Signed)
Please take Euthyrox off her list.  Please send in levothyroxine 75 mcg daily #90 with 3 refills.  Please recheck TSH in 6 weeks to make sure TSH is okay under hypothyroidism unspecified.

## 2019-06-28 NOTE — Telephone Encounter (Signed)
PT states she does not want to take the Euthyrox 38mcg becuase she does not the side effects and she does not know who Rx'd this for her, its increased her hunger. She would like to start back taking Levothyroxine 94mcg.

## 2019-06-28 NOTE — Telephone Encounter (Signed)
Error

## 2019-06-28 NOTE — Telephone Encounter (Signed)
Called and lm on pt vm tcb to clarify Rx.

## 2019-06-29 ENCOUNTER — Encounter: Payer: Self-pay | Admitting: Family Medicine

## 2019-06-29 NOTE — Telephone Encounter (Signed)
Pt returned call and lab appointment scheduled.

## 2019-07-12 ENCOUNTER — Other Ambulatory Visit: Payer: Medicare Other

## 2019-07-28 NOTE — Telephone Encounter (Signed)
Error

## 2019-08-09 ENCOUNTER — Other Ambulatory Visit (INDEPENDENT_AMBULATORY_CARE_PROVIDER_SITE_OTHER): Payer: Medicare Other

## 2019-08-09 ENCOUNTER — Other Ambulatory Visit: Payer: Self-pay

## 2019-08-09 DIAGNOSIS — E039 Hypothyroidism, unspecified: Secondary | ICD-10-CM | POA: Diagnosis not present

## 2019-08-09 LAB — TSH: TSH: 2.34 u[IU]/mL (ref 0.35–4.50)

## 2019-08-13 ENCOUNTER — Other Ambulatory Visit: Payer: Self-pay | Admitting: Internal Medicine

## 2019-08-13 NOTE — Telephone Encounter (Signed)
Rx(s) sent to pharmacy electronically.  

## 2019-09-20 ENCOUNTER — Ambulatory Visit: Payer: Medicare Other | Admitting: Internal Medicine

## 2019-09-27 DIAGNOSIS — H2513 Age-related nuclear cataract, bilateral: Secondary | ICD-10-CM | POA: Diagnosis not present

## 2019-09-27 DIAGNOSIS — H5010 Unspecified exotropia: Secondary | ICD-10-CM | POA: Diagnosis not present

## 2019-09-27 DIAGNOSIS — H10413 Chronic giant papillary conjunctivitis, bilateral: Secondary | ICD-10-CM | POA: Diagnosis not present

## 2019-09-27 DIAGNOSIS — H532 Diplopia: Secondary | ICD-10-CM | POA: Diagnosis not present

## 2019-09-28 ENCOUNTER — Telehealth (INDEPENDENT_AMBULATORY_CARE_PROVIDER_SITE_OTHER): Payer: Medicare Other | Admitting: Internal Medicine

## 2019-09-28 ENCOUNTER — Telehealth: Payer: Self-pay | Admitting: Internal Medicine

## 2019-09-28 ENCOUNTER — Encounter: Payer: Self-pay | Admitting: Internal Medicine

## 2019-09-28 VITALS — HR 87 | Wt 186.0 lb

## 2019-09-28 DIAGNOSIS — R911 Solitary pulmonary nodule: Secondary | ICD-10-CM

## 2019-09-28 DIAGNOSIS — R931 Abnormal findings on diagnostic imaging of heart and coronary circulation: Secondary | ICD-10-CM | POA: Diagnosis not present

## 2019-09-28 DIAGNOSIS — E785 Hyperlipidemia, unspecified: Secondary | ICD-10-CM

## 2019-09-28 DIAGNOSIS — Z789 Other specified health status: Secondary | ICD-10-CM | POA: Diagnosis not present

## 2019-09-28 NOTE — Progress Notes (Signed)
Virtual Visit via Telephone Note   This visit type was conducted due to national recommendations for restrictions regarding the COVID-19 Pandemic (e.g. social distancing) in an effort to limit this patient's exposure and mitigate transmission in our community.  Due to her co-morbid illnesses, this patient is at least at moderate risk for complications without adequate follow up.  This format is felt to be most appropriate for this patient at this time.  The patient did not have access to video technology/had technical difficulties with video requiring transitioning to audio format only (telephone).  All issues noted in this document were discussed and addressed.  No physical exam could be performed with this format.  Please refer to the patient's chart for her  consent to telehealth for [facility].   Evaluation Performed:  Telephone follow-up  Date:  09/28/2019   ID:  Olivia Bender, Olivia Bender 07-27-1950, MRN [id number]  Patient Location:  [address] [address]  Provider location:   [address], [place], [place] [address]  PCP:  Marin Olp, MD  Cardiologist:  No primary care provider on file. Electrophysiologist:  None   Chief Complaint:  Follow-up dyslipidemia  History of Present Illness:    Olivia Bender is a 69 y.o. female who presents via audio/video conferencing for a telehealth visit today.  Olivia Bender is a 69 y.o. female who is being seen today for the evaluation of dyslipidemia at the request of Marin Olp, MD.  This is a pleasant 69 year old female with past medical history significant for dyslipidemia, hypothyroidism, anxiety, headaches and some osteopenia.  She had an episode of chest discomfort in 2007 and underwent nuclear stress testing which was negative but otherwise has had no other cardiac testing.  She does have a longstanding history of dyslipidemia but also significant obesity.  She reports she is lost 46 pounds over the  last year.  Weight now is 164 with a BMI of 32.  Her blood pressures at goal today 128/82.  Most recent lipid profile after weight loss however actually showed an increase in LDL cholesterol.  Her total cholesterol was 275, triglycerides 107, HDL 52 and LDL 200.  LDL appears to have consistently run between 170 and 200 suggesting possible familial dyslipidemia.  Her [place] lipid score is 4 suggesting possible familial hyperlipidemia.  There is a history of heart disease mostly on her mom's side and her aunt maternal grandmother and grandfather and mom who had A. fib and stroke although she is not aware of significant dyslipidemia in these relatives.  She also has a sister who does not have any significant elevation in cholesterol that she is aware of in 2 children, neither of which have high cholesterol to her knowledge.  Unfortunately she also has statin intolerance.  She is previously been tried on Lipitor, Crestor, Livalo, Zetia and even reduced dose Crestor every other day and once weekly without tolerance due to significant myalgias and cramping of the lower extremities which resolved after discontinuing the medications.  03/14/2021  Olivia Bender returns today for follow-up of dyslipidemia.  She is successfully using Repatha and denies any significant side effects with it.  She has had marked reduction in her cholesterol.  Her LDL is decreased from 200-90.  Most recent lipid profile showed total cholesterol 168, triglycerides 243, HDL 53 and LDL of 90.  This represents greater than 50% reduction in her LDL.  09/28/2019  Olivia Bender is seen via virtual follow-up today.  Unfortunately in January she and her husband both had COVID-19.  They were briefly hospitalized and it sounds like he was a little more ill than she was.  Ultimately she has recovered.  She noted some tachycardia and shortness of breath with minimal exertion.  She denied any loss of taste or smell.  She did have a significant headache.  She  stopped her Repatha during that time.  Which is what I would have advised and has since restarted it, completing more than at least 2 shots.  She has not had her lipids retested but is eligible for that.  In addition she had an abnormal CT of the chest which showed a nodule in July of last year.  Recommendation was follow-up of this in 6 to 12 months but that has not been performed.  The patient does not have symptoms concerning for COVID-19 infection (fever, chills, cough, or new SHORTNESS OF BREATH).    Prior CV studies:   The following studies were reviewed today:  Chart reviewed  PMHx:  Past Medical History:  Diagnosis Date  . Allergy   . Anxiety   . Arthritis   . Constipation   . Enlarged liver   . Generalized headaches    since teenage years- usually can get away with tylenol  . Hyperlipidemia   . Osteopenia   . Thyroid disease    hypothyroidism    Past Surgical History:  Procedure Laterality Date  . arthroscopic left knee surgery    . DILATION AND CURETTAGE OF UTERUS     monthlong period related. everything was ok  . NASAL SINUS SURGERY  1986    FAMHx:  Family History  Problem Relation Age of Onset  . Stroke Mother        5. never smoker  . Hypertension Mother   . Other Father        killed in car wreck 2012  . Prostate cancer Father   . Obesity Sister   . Kidney cancer Son   . Heart disease Maternal Grandmother   . Heart disease Maternal Grandfather     SOCHx:   reports that she has never smoked. She has never used smokeless tobacco. She reports that she does not drink alcohol or use drugs.  ALLERGIES:  Allergies  Allergen Reactions  . Tetanus Toxoids Anaphylaxis  . Latex Rash  . Ezetimibe Other (See Comments)    Myalgia  . Statins Other (See Comments)    Lipitor, Crestor, Livalo -myalgias    MEDS:  Current Meds  Medication Sig  . albuterol (VENTOLIN HFA) 108 (90 Base) MCG/ACT inhaler   . aspirin 81 MG EC tablet Take 81 mg by mouth daily.     . Azelastine HCl 137 MCG/SPRAY SOLN Place into the nose.  . cyclobenzaprine (FLEXERIL) 10 MG tablet Take 10 mg by mouth 3 (three) times daily as needed.    Marland Kitchen guaiFENesin-codeine 100-10 MG/5ML syrup TAKE 5 MLS BY MOUTH EVERY 6 (SIX) HOURS AS NEEDED FOR COUGH.  Marland Kitchen ketotifen (ZADITOR) 0.025 % ophthalmic solution 1 drop 2 (two) times daily.  Marland Kitchen levocetirizine (XYZAL) 5 MG tablet Take 5 mg by mouth every evening. Prn only  . levothyroxine (SYNTHROID) 75 MCG tablet Take 1 tablet (75 mcg total) by mouth daily.  Marland Kitchen omeprazole (PRILOSEC OTC) 20 MG tablet Take 20 mg by mouth daily.  Marland Kitchen REPATHA SURECLICK [id number] MG/ML SOAJ INJECT 140MG  EVERY 14 DAYS  . sertraline (ZOLOFT) 50 MG tablet TAKE ONE TABLET BY MOUTH ONCE DAILY  .  Spacer/Aero-Holding Chambers (AEROCHAMBER PLUS FLO-VU) MISC See admin instructions.  . triamcinolone (NASACORT ALLERGY 24HR) 55 MCG/ACT AERO nasal inhaler Place 2 sprays into the nose daily.     ROS: Pertinent items noted in HPI and remainder of comprehensive ROS otherwise negative.  Labs/Other Tests and Data Reviewed:    Recent Labs: 08/09/2019: TSH 2.34   Recent Lipid Panel Lab Results  Component Value Date/Time   CHOL 168 03/09/2019 10:35 AM   TRIG 143 03/09/2019 10:35 AM   HDL 53 03/09/2019 10:35 AM   CHOLHDL 3.2 03/09/2019 10:35 AM   CHOLHDL 5 05/18/2018 09:36 AM   LDLCALC 90 03/09/2019 10:35 AM   LDLCALC 184 (H) 05/22/2017 10:45 AM   LDLDIRECT 184.0 11/21/2017 11:06 AM    Wt Readings from Last 3 Encounters:  09/28/19 186 lb (84.4 kg)  06/08/19 175 lb (79.4 kg)  03/15/19 177 lb (80.3 kg)     Exam:    Vital Signs:  Pulse 87   Wt 186 lb (84.4 kg)   SpO2 98%   BMI 35.14 kg/m    Exam not performed due to telephone visit  ASSESSMENT & PLAN:    1. Elevated LDL cholesterol 2. [place] score 4 -possible FH 3. Family history of coronary disease 4. Statin intolerance 5. CAC score of 7 6. Pulmonary nodules  Olivia Bender is doing better now recovering from COVID-19 in  January.  She is back on her Repatha and will need reassessment of her lipids.  We will get a fasting lipid profile.  We will also plan to repeat a CT scan to follow-up on pulmonary nodules which was recommended on her last CT in July 2021.  COVID-19 Education: The signs and symptoms of COVID-19 were discussed with the patient and how to seek care for testing (follow up with PCP or arrange E-visit).  The importance of social distancing was discussed today.  Patient Risk:   After full review of this patients clinical status, I feel that they are at least moderate risk at this time.  Time:   Today, I have spent 25 minutes with the patient with telehealth technology discussing dyslipidemia, COVID-19, coronary artery disease, pulmonary nodules.     Medication Adjustments/Labs and Tests Ordered: Current medicines are reviewed at length with the patient today.  Concerns regarding medicines are outlined above.   Tests Ordered: No orders of the defined types were placed in this encounter.   Medication Changes: No orders of the defined types were placed in this encounter.   Disposition:  in 1 year(s)  Pixie Casino, MD, [facility] [place] Director of the Advanced Lipid Disorders &  Cardiovascular Risk Reduction Clinic Diplomate of the American Board of Clinical Lipidology Attending Cardiologist  Direct Dial: [phone]  Fax: [phone]  Website:  [email or web address]  Pixie Casino, MD  09/28/2019 8:36 AM

## 2019-09-28 NOTE — Telephone Encounter (Signed)
Spoke with patient regarding appointment for CT chest wo contrast scheduled Wednesday 10/13/19 3:30 pm --arrival time is 3:10pm at [place].  Patient voiced her understanding.

## 2019-09-28 NOTE — Patient Instructions (Signed)
Medication Instructions:  Your physician recommends that you continue on your current medications as directed. Please refer to the Current Medication list given to you today.  *If you need a refill on your cardiac medications before your next appointment, please call your pharmacy*   Lab Work: FASTING lab work to check cholesterol - to be done at any [facility]  If you have labs (blood work) drawn today and your tests are completely normal, you will receive your results only by: Marland Kitchen MyChart Message (if you have MyChart) OR . A paper copy in the mail If you have any lab test that is abnormal or we need to change your treatment, we will call you to review the results.   Testing/Procedures: Non-Contrast Chest CT for lung nodule follow up @ [facility]  [address] - 1st floor   Follow-Up: At [facility], you and your health needs are our priority.  As part of our continuing mission to provide you with exceptional heart care, we have created designated Provider Care Teams.  These Care Teams include your primary Cardiologist (physician) and Advanced Practice Providers (APPs -  Physician Assistants and Nurse Practitioners) who all work together to provide you with the care you need, when you need it.  We recommend signing up for the patient portal called "MyChart".  Sign up information is provided on this After Visit Summary.  MyChart is used to connect with patients for Virtual Visits (Telemedicine).  Patients are able to view lab/test results, encounter notes, upcoming appointments, etc.  Non-urgent messages can be sent to your provider as well.   To learn more about what you can do with MyChart, go to [email or web address].    Your next appointment:   12 month(s)  The format for your next appointment:   Either In Person or Virtual  Provider:   Raliegh Ip Mali Hilty, MD   Other Instructions

## 2019-10-04 ENCOUNTER — Other Ambulatory Visit: Payer: Self-pay | Admitting: Family Medicine

## 2019-10-07 NOTE — Telephone Encounter (Signed)
Pt called following up on prescription refill. Please advise.

## 2019-10-13 ENCOUNTER — Ambulatory Visit
Admission: RE | Admit: 2019-10-13 | Discharge: 2019-10-13 | Disposition: A | Discharge status: Home / Self Care | Payer: Medicare Other | Source: Ambulatory Visit | Attending: Internal Medicine | Admitting: Internal Medicine

## 2019-10-13 DIAGNOSIS — R911 Solitary pulmonary nodule: Secondary | ICD-10-CM

## 2019-10-14 ENCOUNTER — Other Ambulatory Visit: Payer: Self-pay | Admitting: *Deleted

## 2019-10-14 DIAGNOSIS — R911 Solitary pulmonary nodule: Secondary | ICD-10-CM

## 2019-10-21 DIAGNOSIS — K5900 Constipation, unspecified: Secondary | ICD-10-CM | POA: Diagnosis not present

## 2019-10-21 DIAGNOSIS — Z1211 Encounter for screening for malignant neoplasm of colon: Secondary | ICD-10-CM | POA: Diagnosis not present

## 2019-10-21 DIAGNOSIS — K219 Gastro-esophageal reflux disease without esophagitis: Secondary | ICD-10-CM | POA: Diagnosis not present

## 2019-10-27 ENCOUNTER — Telehealth: Payer: Self-pay | Admitting: *Deleted

## 2019-10-27 NOTE — Telephone Encounter (Signed)
Pulmonology appointment schedule on 12/08/19,patient is ware of the date and time, letter mailed.

## 2019-11-24 LAB — HM MAMMOGRAPHY

## 2019-11-25 ENCOUNTER — Encounter: Payer: Self-pay | Admitting: Family Medicine

## 2019-12-08 ENCOUNTER — Institutional Professional Consult (permissible substitution): Payer: Medicare Other | Admitting: Pulmonary Disease

## 2019-12-08 DIAGNOSIS — Z86010 Personal history of colonic polyps: Secondary | ICD-10-CM | POA: Diagnosis not present

## 2019-12-08 DIAGNOSIS — Z1211 Encounter for screening for malignant neoplasm of colon: Secondary | ICD-10-CM | POA: Diagnosis not present

## 2019-12-08 DIAGNOSIS — D120 Benign neoplasm of cecum: Secondary | ICD-10-CM | POA: Diagnosis not present

## 2019-12-08 DIAGNOSIS — D122 Benign neoplasm of ascending colon: Secondary | ICD-10-CM | POA: Diagnosis not present

## 2019-12-08 DIAGNOSIS — K635 Polyp of colon: Secondary | ICD-10-CM | POA: Diagnosis not present

## 2019-12-08 LAB — HM COLONOSCOPY

## 2019-12-09 ENCOUNTER — Institutional Professional Consult (permissible substitution): Payer: Medicare Other | Admitting: Pulmonary Disease

## 2019-12-10 ENCOUNTER — Other Ambulatory Visit: Payer: Self-pay

## 2019-12-10 ENCOUNTER — Ambulatory Visit (INDEPENDENT_AMBULATORY_CARE_PROVIDER_SITE_OTHER): Payer: Medicare Other | Admitting: Family Medicine

## 2019-12-10 ENCOUNTER — Encounter: Payer: Self-pay | Admitting: Family Medicine

## 2019-12-10 VITALS — BP 140/86 | HR 89 | Temp 97.6°F | Ht 61.0 in | Wt 195.0 lb

## 2019-12-10 DIAGNOSIS — I700 Atherosclerosis of aorta: Secondary | ICD-10-CM

## 2019-12-10 DIAGNOSIS — E785 Hyperlipidemia, unspecified: Secondary | ICD-10-CM | POA: Diagnosis not present

## 2019-12-10 DIAGNOSIS — R7303 Prediabetes: Secondary | ICD-10-CM

## 2019-12-10 DIAGNOSIS — E039 Hypothyroidism, unspecified: Secondary | ICD-10-CM

## 2019-12-10 DIAGNOSIS — M89242 Other disorders of bone development and growth, left hand: Secondary | ICD-10-CM

## 2019-12-10 DIAGNOSIS — I10 Essential (primary) hypertension: Secondary | ICD-10-CM

## 2019-12-10 DIAGNOSIS — F325 Major depressive disorder, single episode, in full remission: Secondary | ICD-10-CM

## 2019-12-10 NOTE — Progress Notes (Signed)
Phone [phone] In person visit   Subjective:   Olivia Bender is a 69 y.o. year old very pleasant female patient who presents for/with See problem oriented charting Chief Complaint  Patient presents with  . Hyperlipidemia  . Hypothyroidism   This visit occurred during the SARS-CoV-2 public health emergency.  Safety protocols were in place, including screening questions prior to the visit, additional usage of staff PPE, and extensive cleaning of exam room while observing appropriate contact time as indicated for disinfecting solutions.   Past Medical History-  Patient Active Problem List   Diagnosis Date Noted  . Major depression in full remission ([place]) 05/18/2018    Priority: Medium  . Asthma 11/21/2017    Priority: Medium  . Generalized headaches     Priority: Medium  . Fatty infiltration of liver 12/30/2014    Priority: Medium  . Prediabetes 06/30/2014    Priority: Medium  . Hypothyroidism 12/01/2010    Priority: Medium  . Hyperlipidemia 12/01/2010    Priority: Medium  . Osteopenia 12/01/2010    Priority: Medium  . GE reflux 12/01/2010    Priority: Medium  . Trochanteric bursitis of left hip 12/11/2015    Priority: Low  . Statin intolerance 06/30/2014    Priority: Low  . Left knee pain 06/30/2014    Priority: Low  . Allergic rhinitis 12/01/2010    Priority: Low  . Aortic atherosclerosis ([place]) 12/11/2018    Medications- reviewed and updated Current Outpatient Medications  Medication Sig Dispense Refill  . albuterol (VENTOLIN HFA) 108 (90 Base) MCG/ACT inhaler     . aspirin 81 MG EC tablet Take 81 mg by mouth daily.      . Azelastine HCl 137 MCG/SPRAY SOLN Place into the nose.    . cyclobenzaprine (FLEXERIL) 10 MG tablet Take 10 mg by mouth 3 (three) times daily as needed.      Marland Kitchen levocetirizine (XYZAL) 5 MG tablet Take 5 mg by mouth every evening. Prn only    . levothyroxine (SYNTHROID) 75 MCG tablet Take 1 tablet (75 mcg total) by mouth daily. 90 tablet 3    . sertraline (ZOLOFT) 50 MG tablet TAKE 1 TABLET BY MOUTH EVERY DAY 90 tablet 3  . triamcinolone (NASACORT ALLERGY 24HR) 55 MCG/ACT AERO nasal inhaler Place 2 sprays into the nose daily.    Marland Kitchen omeprazole (PRILOSEC OTC) 20 MG tablet Take 20 mg by mouth daily.     No current facility-administered medications for this visit.     Objective:  BP 140/86   Pulse 89   Temp 97.6 F (36.4 C) (Temporal)   Ht 5\' 1"  (1.549 m)   Wt 195 lb (88.5 kg)   SpO2 97%   BMI 36.84 kg/m  Gen: NAD, resting comfortably CV: RRR no murmurs rubs or gallops Lungs: CTAB no crackles, wheeze, rhonchi Abdomen: soft/nontender/nondistended/normal bowel sounds. No rebound or guarding.  Ext: no edema, 2 x 1.5 cm Skin: warm, dry      Assessment and Plan   # knot on left hand  S:left hand noticed about two weeks ago. No pain with movement. Denies any known injury.   Sister and mom with rheumatoid arthritis.  A/P: This appears to be a possible ganglion cyst of the hand-referral to sports medicine was placed today for further evaluation and consideration of treatment options  # covid 19 vaccination- has had severe local reactoin to Tdap in past- hesitant to do covid vaccinations  #hypothyroidism S: compliant On thyroid medication-levothyroxine 75 mcg Lab Results  Component Value Date   TSH 2.34 08/09/2019  A/P:Well-controlled on last check-continue current medications  #hyperlipidemia/aortic atherosclerosis-incidental finding on imaging S: Medication:  Poorly controlled on no statin.  Has failed Crestor twice a week with coenzyme Q 10, Zetia, Lipitor, Livalo, Crestor once a week as well. Repatha was sent in but was over $200 a month. She did get grant to pay for it but that has ran out and no longer taking   Lab Results  Component Value Date   CHOL 168 03/09/2019   HDL 53 03/09/2019   LDLCALC 90 03/09/2019   LDLDIRECT 184.0 11/21/2017   TRIG 143 03/09/2019   CHOLHDL 3.2 03/09/2019   A/P: We are going to  update lipid panel today.  Unfortunately she states they "make too much" for her to get onto patient assistance. We   # Depression S: Medication:excellent control on zoloft 50 mg. Depression screen [facility] 2/9 12/10/2019 11/16/2018 05/18/2018  Decreased Interest 0 0 0  Down, Depressed, Hopeless 0 0 0  PHQ - 2 Score 0 0 0  Altered sleeping 0 0 0  Tired, decreased energy 0 0 1  Change in appetite 0 1 0  Feeling bad or failure about yourself  0 0 0  Trouble concentrating 0 0 0  Moving slowly or fidgety/restless 0 0 0  Suicidal thoughts 0 0 0  PHQ-9 Score 0 1 1  Difficult doing work/chores Not difficult at all Not difficult at all Not difficult at all  A/P: Excellent control/full remission-continue current medication - son with lupus and that is stressful just finding out  #Stable right lower lobe pulmonary nodule on Oct 13, 2019 measuring 10 x 8 mm.  There was a note to consider PET/CT or biopsy.  Given stability over time they recommended considering 54-month follow-up which would be greater than 18 months from initial scan. -Patient has follow-up with pulmonology later in July for their opinion-I am going to enter a message to myself to check in on this in November to make sure she has at minimum have a repeat CT scan  # Hyperglycemia/insulin resistance/prediabetes S:  Medication: none Exercise and diet- weight up 32 lbs since covid. Did get an exercise machine - arms and legs- can do while seated. Needs to cut down on sweets and eating out Lab Results  Component Value Date   HGBA1C 5.5 05/18/2018   HGBA1C 5.6 05/22/2017   HGBA1C 5.4 06/10/2016   A/P: concern for increased risk of diabetes with weight gain- she is going to work on reversing this trend  #Hypertension-new diagnosis with 2 consecutive elevated readings S: medication: None Home readings #s: None-does have a monitor BP Readings from Last 3 Encounters:  12/10/19 140/86-on repeat 148/92  06/09/19 (!) 150/76  03/15/19 139/87   A/P: Blood pressure elevated on 2 consecutive visits.  Patient is up 30 pounds over the last year.  We opted to focus on healthy eating/regular exercise and weight loss and reassess in 3 months-if remains elevated we will need to consider medication  Recommended follow up: 3 months follow up Future Appointments  Date Time Provider [place]  12/29/2019 11:30 AM Byrum, Rose Fillers, MD [facility] None   Lab/Order associations: had tomato and cheese sandwich   ICD-10-CM   1. Hypothyroidism, unspecified type  E03.9 TSH  2. Major depressive disorder with single episode, in full remission ([place])  F32.5   3. Hyperlipidemia, unspecified hyperlipidemia type  E78.5 CBC with Differential/Platelet    Comprehensive metabolic panel  Lipid panel  4. Aortic atherosclerosis ([facility]) Chronic I70.0   5. Other disorders of bone development and growth, left hand  M89.242 Ambulatory referral to Sports Medicine  6. Prediabetes  R73.03 Hemoglobin A1c    Return precautions advised.  Garret Reddish, MD

## 2019-12-10 NOTE — Patient Instructions (Addendum)
Health Maintenance Due  Topic Date Due  . COVID-19 Vaccine (1) declined for now with Tdap severe local reaction in the past  [email or web address]  [facility] Never done  . COLONOSCOPY had this week . Sign release of information at the check out desk for records from Dr. Collene Mares  07/12/2019   Team- please try to get date of [facility] [place] blood pressure trend concerns me. I would like for you to buy/use a home cuff to check at least 2-4x a week. Your goal is <140/90.  Bring your home cuff and your log of blood pressures with you to next visit in 3 months.   Your blood pressure looks really good before recent weight gain or less focus on healthy eating/regular exercise to get numbers back down.Regular exercise and dash eating plan and weight loss can help   DASH Eating Plan DASH stands for "Dietary Approaches to Stop Hypertension." The DASH eating plan is a healthy eating plan that has been shown to reduce high blood pressure (hypertension). It may also reduce your risk for type 2 diabetes, heart disease, and stroke. The DASH eating plan may also help with weight loss. What are tips for following this plan?  General guidelines  Avoid eating more than 2,300 mg (milligrams) of salt (sodium) a day. If you have hypertension, you may need to reduce your sodium intake to 1,500 mg a day.  Limit alcohol intake to no more than 1 drink a day for nonpregnant women and 2 drinks a day for men. One drink equals 12 oz of beer, 5 oz of wine, or 1 oz of hard liquor.  Work with your health care provider to maintain a healthy body weight or to lose weight. Ask what an ideal weight is for you.  Get at least 30 minutes of exercise that causes your heart to beat faster (aerobic exercise) most days of the week. Activities may include walking, swimming, or biking.  Work with your health care provider or diet and  nutrition specialist (dietitian) to adjust your eating plan to your individual calorie needs. Reading food labels   Check food labels for the amount of sodium per serving. Choose foods with less than 5 percent of the Daily Value of sodium. Generally, foods with less than 300 mg of sodium per serving fit into this eating plan.  To find whole grains, look for the word "whole" as the first word in the ingredient list. Shopping  Buy products labeled as "low-sodium" or "no salt added."  Buy fresh foods. Avoid canned foods and premade or frozen meals. Cooking  Avoid adding salt when cooking. Use salt-free seasonings or herbs instead of table salt or sea salt. Check with your health care provider or pharmacist before using salt substitutes.  Do not fry foods. Cook foods using healthy methods such as baking, boiling, grilling, and broiling instead.  Cook with heart-healthy oils, such as olive, canola, soybean, or sunflower oil. Meal planning  Eat a balanced diet that includes: ? 5 or more servings of fruits and vegetables each day. At each meal, try to fill half of your plate with fruits and vegetables. ? Up to 6-8 servings of whole grains each day. ? Less than 6 oz of lean meat, poultry, or fish each day. A 3-oz serving of meat is about the same size as a deck of cards. One egg equals 1 oz. ? 2 servings of low-fat dairy each day. ? A  serving of nuts, seeds, or beans 5 times each week. ? Heart-healthy fats. Healthy fats called Omega-3 fatty acids are found in foods such as flaxseeds and coldwater fish, like sardines, salmon, and mackerel.  Limit how much you eat of the following: ? Canned or prepackaged foods. ? Food that is high in trans fat, such as fried foods. ? Food that is high in saturated fat, such as fatty meat. ? Sweets, desserts, sugary drinks, and other foods with added sugar. ? Full-fat dairy products.  Do not salt foods before eating.  Try to eat at least 2 vegetarian  meals each week.  Eat more home-cooked food and less restaurant, buffet, and fast food.  When eating at a restaurant, ask that your food be prepared with less salt or no salt, if possible. What foods are recommended? The items listed may not be a complete list. Talk with your dietitian about what dietary choices are best for you. Grains Whole-grain or whole-wheat bread. Whole-grain or whole-wheat pasta. Brown rice. [place]. Bulgur. Whole-grain and low-sodium cereals. Pita bread. Low-fat, low-sodium crackers. Whole-wheat flour tortillas. Vegetables Fresh or frozen vegetables (raw, steamed, roasted, or grilled). Low-sodium or reduced-sodium tomato and vegetable juice. Low-sodium or reduced-sodium tomato sauce and tomato paste. Low-sodium or reduced-sodium canned vegetables. Fruits All fresh, dried, or frozen fruit. Canned fruit in natural juice (without added sugar). Meat and other protein foods Skinless chicken or [place]. Ground chicken or [place]. Pork with fat trimmed off. Fish and seafood. Egg whites. Dried beans, peas, or lentils. Unsalted nuts, nut butters, and seeds. Unsalted canned beans. Lean cuts of beef with fat trimmed off. Low-sodium, lean deli meat. Dairy Low-fat (1%) or fat-free (skim) milk. Fat-free, low-fat, or reduced-fat cheeses. Nonfat, low-sodium ricotta or cottage cheese. Low-fat or nonfat yogurt. Low-fat, low-sodium cheese. Fats and oils Soft margarine without trans fats. Vegetable oil. Low-fat, reduced-fat, or light mayonnaise and salad dressings (reduced-sodium). Canola, safflower, olive, soybean, and sunflower oils. Avocado. Seasoning and other foods Herbs. Spices. Seasoning mixes without salt. Unsalted popcorn and pretzels. Fat-free sweets. What foods are not recommended? The items listed may not be a complete list. Talk with your dietitian about what dietary choices are best for you. Grains Baked goods made with fat, such as croissants, muffins, or some  breads. Dry pasta or rice meal packs. Vegetables Creamed or fried vegetables. Vegetables in a cheese sauce. Regular canned vegetables (not low-sodium or reduced-sodium). Regular canned tomato sauce and paste (not low-sodium or reduced-sodium). Regular tomato and vegetable juice (not low-sodium or reduced-sodium). Angie Fava. Olives. Fruits Canned fruit in a light or heavy syrup. Fried fruit. Fruit in cream or butter sauce. Meat and other protein foods Fatty cuts of meat. Ribs. Fried meat. Berniece Salines. Sausage. Bologna and other processed lunch meats. Salami. Fatback. Hotdogs. Bratwurst. Salted nuts and seeds. Canned beans with added salt. Canned or smoked fish. Whole eggs or egg yolks. Chicken or [place] with skin. Dairy Whole or 2% milk, cream, and half-and-half. Whole or full-fat cream cheese. Whole-fat or sweetened yogurt. Full-fat cheese. Nondairy creamers. Whipped toppings. Processed cheese and cheese spreads. Fats and oils Butter. Stick margarine. Lard. Shortening. Ghee. Bacon fat. Tropical oils, such as coconut, palm kernel, or palm oil. Seasoning and other foods Salted popcorn and pretzels. Onion salt, garlic salt, seasoned salt, table salt, and sea salt. Worcestershire sauce. Tartar sauce. Barbecue sauce. Teriyaki sauce. Soy sauce, including reduced-sodium. Steak sauce. Canned and packaged gravies. Fish sauce. Oyster sauce. Cocktail sauce. Horseradish that you find on the shelf. Ketchup. Mustard.  Meat flavorings and tenderizers. Bouillon cubes. Hot sauce and Tabasco sauce. Premade or packaged marinades. Premade or packaged taco seasonings. Relishes. Regular salad dressings. Where to find more information:  National Heart, Lung, and [place]: https://wilson-eaton.com/  American Heart Association: www.heart.org Summary  The DASH eating plan is a healthy eating plan that has been shown to reduce high blood pressure (hypertension). It may also reduce your risk for type 2 diabetes, heart disease, and  stroke.  With the DASH eating plan, you should limit salt (sodium) intake to 2,300 mg a day. If you have hypertension, you may need to reduce your sodium intake to 1,500 mg a day.  When on the DASH eating plan, aim to eat more fresh fruits and vegetables, whole grains, lean proteins, low-fat dairy, and heart-healthy fats.  Work with your health care provider or diet and nutrition specialist (dietitian) to adjust your eating plan to your individual calorie needs. This information is not intended to replace advice given to you by your health care provider. Make sure you discuss any questions you have with your health care provider. Document Revised: 05/02/2017 Document Reviewed: 05/13/2016 Elsevier Patient Education  2020 [employer].

## 2019-12-10 NOTE — Assessment & Plan Note (Signed)
#  Hypertension-new diagnosis with 2 consecutive elevated readings S: medication: None Home readings #s: None-does have a monitor BP Readings from Last 3 Encounters:  12/10/19 140/86-on repeat 148/92  06/09/19 (!) 150/76  03/15/19 139/87  A/P: Blood pressure elevated on 2 consecutive visits.  Patient is up 30 pounds over the last year.  We opted to focus on healthy eating/regular exercise and weight loss and reassess in 3 months-if remains elevated we will need to consider medication

## 2019-12-11 LAB — LIPID PANEL
Cholesterol: 171 mg/dL (ref <200)
HDL: 41 mg/dL — ABNORMAL LOW (ref >=50)
LDL Cholesterol (Calc): 96 mg/dL (calc)
Non-HDL Cholesterol (Calc): 130 mg/dL (calc) — ABNORMAL HIGH (ref <130)
Total CHOL/HDL Ratio: 4.2 (calc) (ref <5.0)
Triglycerides: 227 mg/dL — ABNORMAL HIGH (ref <150)

## 2019-12-11 LAB — CBC WITH DIFFERENTIAL/PLATELET
Absolute Monocytes: 660 cells/uL (ref 200–950)
Basophils Absolute: 27 cells/uL (ref 0–200)
Basophils Relative: 0.4 %
Eosinophils Absolute: 190 cells/uL (ref 15–500)
Eosinophils Relative: 2.8 %
HCT: 34.6 % — ABNORMAL LOW (ref 35.0–45.0)
Hemoglobin: 11.4 g/dL — ABNORMAL LOW (ref 11.7–15.5)
Lymphs Abs: 2054 cells/uL (ref 850–3900)
MCH: 31.2 pg (ref 27.0–33.0)
MCHC: 32.9 g/dL (ref 32.0–36.0)
MCV: 94.8 fL (ref 80.0–100.0)
MPV: 11.2 fL (ref 7.5–12.5)
Monocytes Relative: 9.7 %
Neutro Abs: 3869 cells/uL (ref 1500–7800)
Neutrophils Relative %: 56.9 %
Platelets: 257 10*3/uL (ref 140–400)
RBC: 3.65 10*6/uL — ABNORMAL LOW (ref 3.80–5.10)
RDW: 12.9 % (ref 11.0–15.0)
Total Lymphocyte: 30.2 %
WBC: 6.8 10*3/uL (ref 3.8–10.8)

## 2019-12-11 LAB — COMPREHENSIVE METABOLIC PANEL
AG Ratio: 1.2 (calc) (ref 1.0–2.5)
ALT: 18 U/L (ref 6–29)
AST: 24 U/L (ref 10–35)
Albumin: 4 g/dL (ref 3.6–5.1)
Alkaline phosphatase (APISO): 63 U/L (ref 37–153)
BUN: 16 mg/dL (ref 7–25)
CO2: 25 mmol/L (ref 20–32)
Calcium: 9.7 mg/dL (ref 8.6–10.4)
Chloride: 108 mmol/L (ref 98–110)
Creat: 0.71 mg/dL (ref 0.50–0.99)
Globulin: 3.3 g/dL (calc) (ref 1.9–3.7)
Glucose, Bld: 86 mg/dL (ref 65–99)
Potassium: 4.2 mmol/L (ref 3.5–5.3)
Sodium: 142 mmol/L (ref 135–146)
Total Bilirubin: 0.3 mg/dL (ref 0.2–1.2)
Total Protein: 7.3 g/dL (ref 6.1–8.1)

## 2019-12-11 LAB — HEMOGLOBIN A1C
Hgb A1c MFr Bld: 5.4 % of total Hgb (ref <5.7)
Mean Plasma Glucose: 108 (calc)
eAG (mmol/L): 6 (calc)

## 2019-12-11 LAB — TSH: TSH: 1.58 mIU/L (ref 0.40–4.50)

## 2019-12-17 ENCOUNTER — Ambulatory Visit: Payer: Medicare Other

## 2019-12-17 ENCOUNTER — Ambulatory Visit: Payer: Medicare Other | Admitting: Family Medicine

## 2019-12-17 DIAGNOSIS — Z23 Encounter for immunization: Secondary | ICD-10-CM

## 2019-12-17 NOTE — Progress Notes (Signed)
   Covid-19 Vaccination Clinic  Name:  Olivia Bender    MRN: [id number] DOB: [birth year]  12/17/2019  Olivia Bender was observed post Covid-19 immunization for 30 minutes based on pre-vaccination screening without incident. She was provided with Vaccine Information Sheet and instruction to access the V-Safe system.   Olivia Bender was instructed to call 911 with any severe reactions post vaccine: Marland Kitchen Difficulty breathing  . Swelling of face and throat  . A fast heartbeat  . A bad rash all over body  . Dizziness and weakness   Immunizations Administered    Name Date Dose VIS Date Route   Pfizer COVID-19 Vaccine 12/17/2019 10:50 AM 0.3 mL 07/28/2018 Intramuscular   Manufacturer: [place]   Lot: [id number]   [place]: [address]-5

## 2019-12-22 ENCOUNTER — Institutional Professional Consult (permissible substitution): Payer: Medicare Other | Admitting: Internal Medicine

## 2019-12-24 ENCOUNTER — Encounter: Payer: Self-pay | Admitting: Family Medicine

## 2019-12-25 ENCOUNTER — Encounter: Payer: Self-pay | Admitting: Family Medicine

## 2019-12-29 ENCOUNTER — Institutional Professional Consult (permissible substitution): Payer: Medicare Other | Admitting: Emergency Medicine

## 2020-01-05 DIAGNOSIS — J310 Chronic rhinitis: Secondary | ICD-10-CM | POA: Diagnosis not present

## 2020-01-05 DIAGNOSIS — H1045 Other chronic allergic conjunctivitis: Secondary | ICD-10-CM | POA: Diagnosis not present

## 2020-01-05 DIAGNOSIS — K219 Gastro-esophageal reflux disease without esophagitis: Secondary | ICD-10-CM | POA: Diagnosis not present

## 2020-01-05 DIAGNOSIS — J4520 Mild intermittent asthma, uncomplicated: Secondary | ICD-10-CM | POA: Diagnosis not present

## 2020-01-06 ENCOUNTER — Ambulatory Visit: Payer: Medicare Other | Attending: Internal Medicine

## 2020-01-06 DIAGNOSIS — Z23 Encounter for immunization: Secondary | ICD-10-CM

## 2020-01-06 NOTE — Progress Notes (Signed)
   Covid-19 Vaccination Clinic  Name:  AMALIYA WHITELAW    MRN: [id number] DOB: [birth year]  01/06/2020  Ms. Halls was observed post Covid-19 immunization for 30 minutes based on pre-vaccination screening without incident. She was provided with Vaccine Information Sheet and instruction to access the V-Safe system.   Ms. Marseille was instructed to call 911 with any severe reactions post vaccine: Marland Kitchen Difficulty breathing  . Swelling of face and throat  . A fast heartbeat  . A bad rash all over body  . Dizziness and weakness   Immunizations Administered    Name Date Dose VIS Date Route   Pfizer COVID-19 Vaccine 01/06/2020  9:30 AM 0.3 mL 07/28/2018 Intramuscular   Manufacturer: [employer], [employer]   Lot: [id number]   [employer]: [id number]

## 2020-01-21 ENCOUNTER — Ambulatory Visit (INDEPENDENT_AMBULATORY_CARE_PROVIDER_SITE_OTHER): Payer: Medicare Other

## 2020-01-21 DIAGNOSIS — Z0000 Encounter for general adult medical examination without abnormal findings: Secondary | ICD-10-CM | POA: Diagnosis not present

## 2020-01-21 NOTE — Patient Instructions (Signed)
Olivia Bender , Thank you for taking time to come for your Medicare Wellness Visit. I appreciate your ongoing commitment to your health goals. Please review the following plan we discussed and let me know if I can assist you in the future.   Screening recommendations/referrals: Colonoscopy: Done 12/08/19 Mammogram: Done 11/24/19 Bone Density: Done 11/18/18 Recommended yearly ophthalmology/optometry visit for glaucoma screening and checkup Recommended yearly dental visit for hygiene and checkup  Vaccinations: Influenza vaccine: Up to date Pneumococcal vaccine: Up to date Tdap vaccine: Allergic unable to recieve Shingles vaccine: Completed 04/20/19 & 09/15/19   Covid-19:Completed 7/16 & 01/06/20  Advanced directives: Please bring a copy of your health care power of attorney and living will to the office at your convenience.   Conditions/risks identified: Lose weight  Next appointment: Follow up in one year for your annual wellness visit    Preventive Care 70 Years and Older, Female Preventive care refers to lifestyle choices and visits with your health care provider that can promote health and wellness. What does preventive care include?  A yearly physical exam. This is also called an annual well check.  Dental exams once or twice a year.  Routine eye exams. Ask your health care provider how often you should have your eyes checked.  Personal lifestyle choices, including:  Daily care of your teeth and gums.  Regular physical activity.  Eating a healthy diet.  Avoiding tobacco and drug use.  Limiting alcohol use.  Practicing safe sex.  Taking low-dose aspirin every day.  Taking vitamin and mineral supplements as recommended by your health care provider. What happens during an annual well check? The services and screenings done by your health care provider during your annual well check will depend on your age, overall health, lifestyle risk factors, and family history of  disease. Counseling  Your health care provider may ask you questions about your:  Alcohol use.  Tobacco use.  Drug use.  Emotional well-being.  Home and relationship well-being.  Sexual activity.  Eating habits.  History of falls.  Memory and ability to understand (cognition).  Work and work Statistician.  Reproductive health. Screening  You may have the following tests or measurements:  Height, weight, and BMI.  Blood pressure.  Lipid and cholesterol levels. These may be checked every 5 years, or more frequently if you are over 70 years old.  Skin check.  Lung cancer screening. You may have this screening every year starting at age 70 if you have a 30-pack-year history of smoking and currently smoke or have quit within the past 15 years.  Fecal occult blood test (FOBT) of the stool. You may have this test every year starting at age 70.  Flexible sigmoidoscopy or colonoscopy. You may have a sigmoidoscopy every 5 years or a colonoscopy every 10 years starting at age 70.  Hepatitis C blood test.  Hepatitis B blood test.  Sexually transmitted disease (STD) testing.  Diabetes screening. This is done by checking your blood sugar (glucose) after you have not eaten for a while (fasting). You may have this done every 1-3 years.  Bone density scan. This is done to screen for osteoporosis. You may have this done starting at age 70.  Mammogram. This may be done every 1-2 years. Talk to your health care provider about how often you should have regular mammograms. Talk with your health care provider about your test results, treatment options, and if necessary, the need for more tests. Vaccines  Your health care provider may  recommend certain vaccines, such as:  Influenza vaccine. This is recommended every year.  Tetanus, diphtheria, and acellular pertussis (Tdap, Td) vaccine. You may need a Td booster every 10 years.  Zoster vaccine. You may need this after age  70.  Pneumococcal 13-valent conjugate (PCV13) vaccine. One dose is recommended after age 70.  Pneumococcal polysaccharide (PPSV23) vaccine. One dose is recommended after age 70. Talk to your health care provider about which screenings and vaccines you need and how often you need them. This information is not intended to replace advice given to you by your health care provider. Make sure you discuss any questions you have with your health care provider. Document Released: 06/16/2015 Document Revised: 02/07/2016 Document Reviewed: 03/21/2015 Elsevier Interactive Patient Education  2017 [place] Prevention in the Home Falls can cause injuries. They can happen to people of all ages. There are many things you can do to make your home safe and to help prevent falls. What can I do on the outside of my home?  Regularly fix the edges of walkways and driveways and fix any cracks.  Remove anything that might make you trip as you walk through a door, such as a raised step or threshold.  Trim any bushes or trees on the path to your home.  Use bright outdoor lighting.  Clear any walking paths of anything that might make someone trip, such as rocks or tools.  Regularly check to see if handrails are loose or broken. Make sure that both sides of any steps have handrails.  Any raised decks and porches should have guardrails on the edges.  Have any leaves, snow, or ice cleared regularly.  Use sand or salt on walking paths during winter.  Clean up any spills in your garage right away. This includes oil or grease spills. What can I do in the bathroom?  Use night lights.  Install grab bars by the toilet and in the tub and shower. Do not use towel bars as grab bars.  Use non-skid mats or decals in the tub or shower.  If you need to sit down in the shower, use a plastic, non-slip stool.  Keep the floor dry. Clean up any water that spills on the floor as soon as it happens.  Remove  soap buildup in the tub or shower regularly.  Attach bath mats securely with double-sided non-slip rug tape.  Do not have throw rugs and other things on the floor that can make you trip. What can I do in the bedroom?  Use night lights.  Make sure that you have a light by your bed that is easy to reach.  Do not use any sheets or blankets that are too big for your bed. They should not hang down onto the floor.  Have a firm chair that has side arms. You can use this for support while you get dressed.  Do not have throw rugs and other things on the floor that can make you trip. What can I do in the kitchen?  Clean up any spills right away.  Avoid walking on wet floors.  Keep items that you use a lot in easy-to-reach places.  If you need to reach something above you, use a strong step stool that has a grab bar.  Keep electrical cords out of the way.  Do not use floor polish or wax that makes floors slippery. If you must use wax, use non-skid floor wax.  Do not have throw rugs and  other things on the floor that can make you trip. What can I do with my stairs?  Do not leave any items on the stairs.  Make sure that there are handrails on both sides of the stairs and use them. Fix handrails that are broken or loose. Make sure that handrails are as long as the stairways.  Check any carpeting to make sure that it is firmly attached to the stairs. Fix any carpet that is loose or worn.  Avoid having throw rugs at the top or bottom of the stairs. If you do have throw rugs, attach them to the floor with carpet tape.  Make sure that you have a light switch at the top of the stairs and the bottom of the stairs. If you do not have them, ask someone to add them for you. What else can I do to help prevent falls?  Wear shoes that:  Do not have high heels.  Have rubber bottoms.  Are comfortable and fit you well.  Are closed at the toe. Do not wear sandals.  If you use a  stepladder:  Make sure that it is fully opened. Do not climb a closed stepladder.  Make sure that both sides of the stepladder are locked into place.  Ask someone to hold it for you, if possible.  Clearly mark and make sure that you can see:  Any grab bars or handrails.  First and last steps.  Where the edge of each step is.  Use tools that help you move around (mobility aids) if they are needed. These include:  Canes.  Walkers.  Scooters.  Crutches.  Turn on the lights when you go into a dark area. Replace any light bulbs as soon as they burn out.  Set up your furniture so you have a clear path. Avoid moving your furniture around.  If any of your floors are uneven, fix them.  If there are any pets around you, be aware of where they are.  Review your medicines with your doctor. Some medicines can make you feel dizzy. This can increase your chance of falling. Ask your doctor what other things that you can do to help prevent falls. This information is not intended to replace advice given to you by your health care provider. Make sure you discuss any questions you have with your health care provider. Document Released: 03/16/2009 Document Revised: 10/26/2015 Document Reviewed: 06/24/2014 Elsevier Interactive Patient Education  2017 [employer].

## 2020-01-21 NOTE — Progress Notes (Signed)
Virtual Visit via Telephone Note  I connected with  Olivia Bender on 01/21/20 at 11:45 AM EDT by telephone and verified that I am speaking with the correct person using two identifiers.  Medicare Annual Wellness visit completed telephonically due to Covid-19 pandemic.   Persons participating in this call: This Health Coach and this patient.  Location: Patient: Home Provider: Office   I discussed the limitations, risks, security and privacy concerns of performing an evaluation and management service by telephone and the availability of in person appointments. The patient expressed understanding and agreed to proceed.  Unable to perform video visit due to video visit attempted and failed and/or patient does not have video capability.   Some vital signs may be absent or patient reported.   Willette Brace, LPN    Subjective:   Olivia Bender is a 69 y.o. female who presents for Medicare Annual (Subsequent) preventive examination.  Review of Systems     Cardiac Risk Factors include: advanced age (>74men, >57 women);hypertension;dyslipidemia;obesity (BMI >30kg/m2)     Objective:    There were no vitals filed for this visit. There is no height or weight on file to calculate BMI.  Advanced Directives 01/21/2020 06/08/2019 01/19/2019 07/31/2016 06/10/2016  Does Patient Have a Medical Advance Directive? Yes No Yes No Yes  Type of Advance Directive Living will - Living will;Healthcare Power of Attorney - Living will;Healthcare Power of Attorney  Does patient want to make changes to medical advance directive? - - No - Patient declined - -  Copy of [place] in Chart? - - No - copy requested - No - copy requested  Would patient like information on creating a medical advance directive? - - - No - Patient declined -    Current Medications (verified) Outpatient Encounter Medications as of 01/21/2020  Medication Sig  . albuterol (VENTOLIN HFA) 108 (90 Base) MCG/ACT inhaler    . aspirin 81 MG EC tablet Take 81 mg by mouth daily.    . Azelastine HCl 137 MCG/SPRAY SOLN Place into the nose.  . cyclobenzaprine (FLEXERIL) 10 MG tablet Take 10 mg by mouth 3 (three) times daily as needed.    Marland Kitchen levocetirizine (XYZAL) 5 MG tablet Take 5 mg by mouth every evening. Prn only  . levothyroxine (SYNTHROID) 75 MCG tablet Take 1 tablet (75 mcg total) by mouth daily.  Marland Kitchen omeprazole (PRILOSEC OTC) 20 MG tablet Take 20 mg by mouth daily.  . sertraline (ZOLOFT) 50 MG tablet TAKE 1 TABLET BY MOUTH EVERY DAY  . triamcinolone (NASACORT ALLERGY 24HR) 55 MCG/ACT AERO nasal inhaler Place 2 sprays into the nose daily.   No facility-administered encounter medications on file as of 01/21/2020.    Allergies (verified) Tetanus toxoids, Latex, Ezetimibe, and Statins   History: Past Medical History:  Diagnosis Date  . Allergy   . Anxiety   . Arthritis   . Constipation   . Enlarged liver   . Generalized headaches    since teenage years- usually can get away with tylenol  . Hyperlipidemia   . Osteopenia   . Thyroid disease    hypothyroidism   Past Surgical History:  Procedure Laterality Date  . arthroscopic left knee surgery    . DILATION AND CURETTAGE OF UTERUS     monthlong period related. everything was ok  . NASAL SINUS SURGERY  1986   Family History  Problem Relation Age of Onset  . Stroke Mother        3.  never smoker  . Hypertension Mother   . Other Father        killed in car wreck 2012  . Prostate cancer Father   . Obesity Sister   . Kidney cancer Son   . Heart disease Maternal Grandmother   . Heart disease Maternal Grandfather    Social History   Socioeconomic History  . Marital status: Married    Spouse name: Not on file  . Number of children: Not on file  . Years of education: Not on file  . Highest education level: Not on file  Occupational History  . Occupation: Retired  Tobacco Use  . Smoking status: Never Smoker  . Smokeless tobacco: Never Used    Substance and Sexual Activity  . Alcohol use: No  . Drug use: Never  . Sexual activity: Not on file  Other Topics Concern  . Not on file  Social History Narrative   Married. 5 kids total between her and husband.  One son is a Mudlogger.      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        Hobbies: travel but hsuband ill   Social Determinants of Radio broadcast assistant Strain: Low Risk   . Difficulty of Paying Living Expenses: Not hard at all  Food Insecurity: No Food Insecurity  . Worried About Charity fundraiser in the Last Year: Never true  . Ran Out of Food in the Last Year: Never true  Transportation Needs: No Transportation Needs  . Lack of Transportation (Medical): No  . Lack of Transportation (Non-Medical): No  Physical Activity: Inactive  . Days of Exercise per Week: 0 days  . Minutes of Exercise per Session: 0 min  Stress: No Stress Concern Present  . Feeling of Stress : Not at all  Social Connections: Moderately Integrated  . Frequency of Communication with Friends and Family: Twice a week  . Frequency of Social Gatherings with Friends and Family: Twice a week  . Attends Religious Services: More than 4 times per year  . Active Member of Clubs or Organizations: No  . Attends Archivist Meetings: Never  . Marital Status: Married    Tobacco Counseling Counseling given: Not Answered   Clinical Intake:  Pre-visit preparation completed: Yes  Pain : No/denies pain     BMI - recorded: 36.86 Nutritional Status: BMI > 30  Obese Diabetes: No  How often do you need to have someone help you when you read instructions, pamphlets, or other written materials from your doctor or pharmacy?: 1 - Never  Diabetic?No  Interpreter Needed?: No  Information entered by :: Charlott Rakes, LPN   Activities of Daily Living In your present state of health, do you have any difficulty performing  the following activities: 01/21/2020  Vision? N  Difficulty concentrating or making decisions? N  Walking or climbing stairs? N  Dressing or bathing? N  Doing errands, shopping? N  Preparing Food and eating ? N  Using the Toilet? N  In the past six months, have you accidently leaked urine? Y  Comment wears a pad at times  Do you have problems with loss of bowel control? N  Managing your Medications? N  Managing your Finances? N  Housekeeping or managing your Housekeeping? N  Some recent data might be hidden    Patient Care Team: Marin Olp, MD as PCP - General (Family Medicine) [place],  Romilda Garret, DPM as Consulting Physician (Podiatry) Debara Pickett, Nadean Corwin, MD as Consulting Physician (Cardiology) Harold Hedge, Darrick Grinder, MD as Consulting Physician (Allergy and Immunology)  Indicate any recent Medical Services you may have received from other than [facility] providers in the past year (date may be approximate).     Assessment:   This is a routine wellness examination for Olivia Bender.  Hearing/Vision screen  Hearing Screening   125Hz  250Hz  500Hz  1000Hz  2000Hz  3000Hz  4000Hz  6000Hz  8000Hz   Right ear:           Left ear:           Comments: Pt denies any hearing at this time  Vision Screening Comments: Pt states Dr Katy Fitch follows up annually with eye exams  Dietary issues and exercise activities discussed: Current Exercise Habits: Home exercise routine, Type of exercise: Other - see comments (riding stationary bike), Time (Minutes): 40, Frequency (Times/Week): 5, Weekly Exercise (Minutes/Week): 200  Goals    . Patient Stated     Lose weight       Depression Screen PHQ 2/9 Scores 01/21/2020 12/10/2019 11/16/2018 05/18/2018 11/21/2017 05/22/2017 12/30/2014  PHQ - 2 Score 0 0 0 0 0 0 0  PHQ- 9 Score - 0 1 1 0 - -    Fall Risk Fall Risk  01/21/2020 12/10/2019 01/19/2019 11/16/2018 05/22/2017  Falls in the past year? 1 1 0 1 Yes  Comment - 2 in last 6 months - - -  Number falls in past yr: 1 1 0 1 1    Injury with Fall? 0 1 0 0 No  Comment just sore - - - -  Risk for fall due to : Impaired balance/gait;Impaired vision;History of fall(s) (No Data) - - -  Risk for fall due to: Comment pt stated problems with dizzyness triped - - -  Follow up Falls prevention discussed - Education provided - -    Any stairs in or around the home? Yes  If so, are there any without handrails? No  Home free of loose throw rugs in walkways, pet beds, electrical cords, etc? Yes  Adequate lighting in your home to reduce risk of falls? Yes   ASSISTIVE DEVICES UTILIZED TO PREVENT FALLS:  Life alert? No  Use of a cane, walker or w/c? No  Grab bars in the bathroom? No  Shower chair or bench in shower? No  Elevated toilet seat or a handicapped toilet? Yes  TIMED UP AND GO:  Was the test performed? No .      Cognitive Function:     6CIT Screen 01/21/2020  What Year? 0 points  What month? 0 points  Count back from 20 0 points  Months in reverse 0 points  Repeat phrase 2 points    Immunizations Immunization History  Administered Date(s) Administered  . Influenza, High Dose Seasonal PF 04/20/2018, 03/04/2019  . Influenza,inj,Quad PF,6+ Mos 05/22/2017  . Influenza-Unspecified 04/17/2014  . PFIZER SARS-COV-2 Vaccination 12/17/2019, 01/06/2020  . Pneumococcal Conjugate-13 07/28/2015  . Pneumococcal Polysaccharide-23 07/14/2005, 11/21/2017  . Zoster 12/23/2011  . Zoster Recombinat (Shingrix) 04/20/2019, 09/15/2019    TDAP status: Due, Education has been provided regarding the importance of this vaccine. Advised may receive this vaccine at local pharmacy or Health Dept. Aware to provide a copy of the vaccination record if obtained from local pharmacy or Health Dept. Verbalized acceptance and understanding. Flu Vaccine status: Up to date Pneumococcal vaccine status: Up to date Covid-19 vaccine status: Completed vaccines  Qualifies for Shingles Vaccine? Yes  Zostavax completed Yes   Shingrix  Completed?: Yes  Screening Tests Health Maintenance  Topic Date Due  . INFLUENZA VACCINE  01/02/2020  . MAMMOGRAM  11/23/2020  . TETANUS/TDAP  05/15/2022  . COLONOSCOPY  12/07/2024  . DEXA SCAN  Completed  . COVID-19 Vaccine  Completed  . Hepatitis C Screening  Completed  . PNA vac Low Risk Adult  Completed    Health Maintenance  Health Maintenance Due  Topic Date Due  . INFLUENZA VACCINE  01/02/2020    Colorectal cancer screening: Completed 12/08/19. Repeat every 5 years Mammogram status: Completed 11/24/19. Repeat every year Bone Density status: Completed 11/18/18. Results reflect: Bone density results: OSTEOPENIA. Repeat every 2 years.    Additional Screening:  Hepatitis C Screening: Completed 02/24/14  Vision Screening: Recommended annual ophthalmology exams for early detection of glaucoma and other disorders of the eye. Is the patient up to date with their annual eye exam?  Yes  Who is the provider or what is the name of the office in which the patient attends annual eye exams? Dr Katy Fitch   Dental Screening: Recommended annual dental exams for proper oral hygiene  Community Resource Referral / Chronic Care Management: CRR required this visit?  No   CCM required this visit?  No      Plan:     I have personally reviewed and noted the following in the patient's chart:   . Medical and social history . Use of alcohol, tobacco or illicit drugs  . Current medications and supplements . Functional ability and status . Nutritional status . Physical activity . Advanced directives . List of other physicians . Hospitalizations, surgeries, and ER visits in previous 12 months . Vitals . Screenings to include cognitive, depression, and falls . Referrals and appointments  In addition, I have reviewed and discussed with patient certain preventive protocols, quality metrics, and best practice recommendations. A written personalized care plan for preventive services as well  as general preventive health recommendations were provided to patient.     Willette Brace, LPN   3/76/2831   Nurse Notes: Pt has stated concern with wetting self at night and leaking more during the day. At times it is urgency and wears a pad for accidents. Has an appt in October , however will call if need to follow up sooner.

## 2020-02-15 ENCOUNTER — Ambulatory Visit (INDEPENDENT_AMBULATORY_CARE_PROVIDER_SITE_OTHER): Payer: Medicare Other

## 2020-02-15 ENCOUNTER — Ambulatory Visit (INDEPENDENT_AMBULATORY_CARE_PROVIDER_SITE_OTHER): Payer: Medicare Other | Admitting: Podiatry

## 2020-02-15 ENCOUNTER — Other Ambulatory Visit: Payer: Self-pay | Admitting: Podiatry

## 2020-02-15 ENCOUNTER — Other Ambulatory Visit: Payer: Self-pay

## 2020-02-15 DIAGNOSIS — M722 Plantar fascial fibromatosis: Secondary | ICD-10-CM

## 2020-02-15 DIAGNOSIS — M76822 Posterior tibial tendinitis, left leg: Secondary | ICD-10-CM

## 2020-02-15 NOTE — Patient Instructions (Signed)

## 2020-02-16 NOTE — Progress Notes (Signed)
She presents today chief complaint of left medial arch pain and left lateral ankle pain and swelling states it radiates up into the leg is been going on for about 3 months pain levels about 5 out of 10 occasional pain and stinging and leg numbness.  And she is treated with Tylenol.  Objective: Vital signs are stable alert and oriented x3.  Pulses are palpable.  She has pain on palpation medial calcaneal tubercles bilaterally.  No erythema edema cellulitis drainage odor no open lesions or wounds are noted.  Radiographs taken today demonstrate soft tissue increase in density plantar fascial kidney insertion site no acute findings.  Assessment: Plantar fasciitis bilateral.  Plan: Discussed etiology pathology and surgical therapies I injected the bilateral heels today left more painful than the right 20 mg Kenalog 5 mg of Marcaine placed in bilateral plantar fascial braces and I will follow-up with her in 4 to 6 weeks we discussed appropriate shoe gear stretching exercise ice therapy shoe gear modifications.

## 2020-02-21 ENCOUNTER — Encounter: Payer: Self-pay | Admitting: Emergency Medicine

## 2020-02-21 ENCOUNTER — Other Ambulatory Visit: Payer: Self-pay

## 2020-02-21 ENCOUNTER — Ambulatory Visit (INDEPENDENT_AMBULATORY_CARE_PROVIDER_SITE_OTHER): Payer: Medicare Other | Admitting: Emergency Medicine

## 2020-02-21 DIAGNOSIS — R911 Solitary pulmonary nodule: Secondary | ICD-10-CM | POA: Insufficient documentation

## 2020-02-21 DIAGNOSIS — J4520 Mild intermittent asthma, uncomplicated: Secondary | ICD-10-CM

## 2020-02-21 DIAGNOSIS — J301 Allergic rhinitis due to pollen: Secondary | ICD-10-CM

## 2020-02-21 NOTE — Assessment & Plan Note (Signed)
On a good allergy regimen with breakthrough symptoms currently.  She is having some dry cough.  Plan continue same regimen

## 2020-02-21 NOTE — Patient Instructions (Addendum)
We will repeat your CT scan of the chest without contrast in November 2021 to follow your pulmonary nodule. We will arrange for full pulmonary function testing at your next office visit Okay to keep albuterol available to use 2 puffs when needed for shortness of breath, chest tightness, wheezing. Continue Xyzal and nasal sprays as directed by Dr. Fredderick Phenix COVID 19 vaccine is up to date.  Follow with Dr. Lamonte Sakai next available with full pulmonary function testing on the same day.

## 2020-02-21 NOTE — Assessment & Plan Note (Signed)
Minimal symptoms she would benefit from full pulmonary function testing to quantify her degree of obstruction.  Keep albuterol available use as needed

## 2020-02-21 NOTE — Assessment & Plan Note (Signed)
9 mm volume averaged pulmonary nodule, now stable for 15 months.  Low risk as a never smoker, no family history.  Plan to repeat in 6 months and then plan timing for final scan depending on stability.

## 2020-02-21 NOTE — Addendum Note (Signed)
Addended by: Gavin Potters R on: 02/21/2020 10:51 AM   Modules accepted: Orders

## 2020-02-21 NOTE — Progress Notes (Signed)
Subjective:    Patient ID: Olivia Bender, female    DOB: [birth year], 69 y.o.   MRN: [id number]  HPI 69 year old never smoker with a history of hyperlipidemia, hypothyroidism, allergies and suspected asthma. She had COVID in Jan-Feb 2021 with B infiltrates. She is referred today to follow a pulmonary nodule. She underwent cardiac CT scoring 07/29/2018 that I reviewed, showed a coronary calcium score of 7.  The lung windows revealed a 10 mm right lower lobe pulmonary nodule, somewhat irregular.  Follow-up CT 12/11/2018 and 10/14/2019 reviewed showed an 8 to 10 mm nodule, volume average 9 mm in the right lower lobe.  Stability going back to 07/2018 (15 months). Her infiltrated from COVID resolved.   She feels well, no dyspnea. She has some dry cough, has allergies and follows with Dr Fredderick Phenix. Uses albuterol rarely - few times a month.    Review of Systems As per HPI  Past Medical History:  Diagnosis Date  . Allergy   . Anxiety   . Arthritis   . Constipation   . Enlarged liver   . Generalized headaches    since teenage years- usually can get away with tylenol  . Hyperlipidemia   . Osteopenia   . Thyroid disease    hypothyroidism     Family History  Problem Relation Age of Onset  . Stroke Mother        69. never smoker  . Hypertension Mother   . Other Father        killed in car wreck 2012  . Prostate cancer Father   . Obesity Sister   . Kidney cancer Son   . Heart disease Maternal Grandmother   . Heart disease Maternal Grandfather      Social History   Socioeconomic History  . Marital status: Married    Spouse name: Not on file  . Number of children: Not on file  . Years of education: Not on file  . Highest education level: Not on file  Occupational History  . Occupation: Retired  Tobacco Use  . Smoking status: Never Smoker  . Smokeless tobacco: Never Used  Substance and Sexual Activity  . Alcohol use: No  . Drug use: Never  . Sexual activity: Not on file    Other Topics Concern  . Not on file  Social History Narrative   Married. 5 kids total between her and husband.  One son is a Mudlogger.      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        Hobbies: travel but hsuband ill   Social Determinants of Radio broadcast assistant Strain: Low Risk   . Difficulty of Paying Living Expenses: Not hard at all  Food Insecurity: No Food Insecurity  . Worried About Charity fundraiser in the Last Year: Never true  . Ran Out of Food in the Last Year: Never true  Transportation Needs: No Transportation Needs  . Lack of Transportation (Medical): No  . Lack of Transportation (Non-Medical): No  Physical Activity: Inactive  . Days of Exercise per Week: 0 days  . Minutes of Exercise per Session: 0 min  Stress: No Stress Concern Present  . Feeling of Stress : Not at all  Social Connections: Moderately Integrated  . Frequency of Communication with Friends and Family: Twice a week  . Frequency of Social Gatherings with Friends and Family: Twice a week  . Attends  Religious Services: More than 4 times per year  . Active Member of Clubs or Organizations: No  . Attends Archivist Meetings: Never  . Marital Status: Married  Human resources officer Violence: Not At Risk  . Fear of Current or Ex-Partner: No  . Emotionally Abused: No  . Physically Abused: No  . 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    Allergies  Allergen Reactions  . Tetanus Toxoids Anaphylaxis  . Latex Rash  . Ezetimibe Other (See Comments)    Myalgia  . Statins Other (See Comments)    Lipitor, Crestor, Livalo -myalgias     Outpatient Medications Prior to Visit  Medication Sig Dispense Refill  . albuterol (VENTOLIN HFA)  108 (90 Base) MCG/ACT inhaler     . aspirin 81 MG EC tablet Take 81 mg by mouth daily.      . Azelastine HCl 137 MCG/SPRAY SOLN Place into the nose.    . cyclobenzaprine (FLEXERIL) 10 MG tablet Take 10 mg by mouth 3 (three) times daily as needed.      Marland Kitchen levocetirizine (XYZAL) 5 MG tablet Take 5 mg by mouth every evening. Prn only    . levothyroxine (SYNTHROID) 75 MCG tablet Take 1 tablet (75 mcg total) by mouth daily. 90 tablet 3  . omeprazole (PRILOSEC OTC) 20 MG tablet Take 20 mg by mouth daily.    . sertraline (ZOLOFT) 50 MG tablet TAKE 1 TABLET BY MOUTH EVERY DAY 90 tablet 3  . triamcinolone (NASACORT ALLERGY 24HR) 55 MCG/ACT AERO nasal inhaler Place 2 sprays into the nose daily.     No facility-administered medications prior to visit.        Objective:   Physical Exam Vitals:   02/21/20 0928  BP: 134/78  Pulse: 78  Temp: 97.9 F (36.6 C)  TempSrc: Oral  SpO2: 97%  Weight: 198 lb 9.6 oz (90.1 kg)  Height: 5\' 1"  (1.549 m)   Gen: Pleasant, well-nourished, in no distress,  normal affect  ENT: No lesions,  mouth clear,  oropharynx clear, no postnasal drip  Neck: No JVD, no stridor  Lungs: No use of accessory muscles, no crackles or wheezing on normal respiration, no wheeze on forced expiration  Cardiovascular: RRR, heart sounds normal, no murmur or gallops, no peripheral edema  Musculoskeletal: No deformities, no cyanosis or clubbing  Neuro: alert, awake, non focal  Skin: Warm, no lesions or rash      Assessment & Plan:  Allergic rhinitis On a good allergy regimen with breakthrough symptoms currently.  She is having some dry cough.  Plan continue same regimen  Asthma Minimal symptoms she would benefit from full pulmonary function testing to quantify her degree of obstruction.  Keep albuterol available use as needed  Pulmonary nodule/lesion, solitary 9 mm volume averaged pulmonary nodule, now stable for 15 months.  Low risk as a never smoker, no family history.   Plan to repeat in 6 months and then plan timing for final scan depending on stability.  Baltazar Apo, MD, PhD 02/21/2020, 10:09 AM  Pulmonary and Critical Care [phone] or if no answer [phone]

## 2020-02-27 ENCOUNTER — Encounter (HOSPITAL_COMMUNITY): Payer: Self-pay | Admitting: Emergency Medicine

## 2020-02-27 ENCOUNTER — Other Ambulatory Visit: Payer: Self-pay

## 2020-02-27 ENCOUNTER — Emergency Department (HOSPITAL_COMMUNITY): Payer: Medicare Other

## 2020-02-27 ENCOUNTER — Emergency Department (HOSPITAL_COMMUNITY)
Admission: EM | Admit: 2020-02-27 | Discharge: 2020-02-27 | Disposition: A | Discharge status: Home / Self Care | Payer: Medicare Other | Attending: Emergency Medicine | Admitting: Emergency Medicine

## 2020-02-27 DIAGNOSIS — Z79899 Other long term (current) drug therapy: Secondary | ICD-10-CM | POA: Insufficient documentation

## 2020-02-27 DIAGNOSIS — I10 Essential (primary) hypertension: Secondary | ICD-10-CM | POA: Diagnosis not present

## 2020-02-27 DIAGNOSIS — Z7982 Long term (current) use of aspirin: Secondary | ICD-10-CM | POA: Insufficient documentation

## 2020-02-27 DIAGNOSIS — M47816 Spondylosis without myelopathy or radiculopathy, lumbar region: Secondary | ICD-10-CM | POA: Diagnosis not present

## 2020-02-27 DIAGNOSIS — M25552 Pain in left hip: Secondary | ICD-10-CM | POA: Diagnosis not present

## 2020-02-27 DIAGNOSIS — J45909 Unspecified asthma, uncomplicated: Secondary | ICD-10-CM | POA: Diagnosis not present

## 2020-02-27 DIAGNOSIS — E039 Hypothyroidism, unspecified: Secondary | ICD-10-CM | POA: Diagnosis not present

## 2020-02-27 MED ORDER — DEXAMETHASONE SODIUM PHOSPHATE 10 MG/ML IJ SOLN
10.0000 mg | Freq: Once | INTRAMUSCULAR | Status: AC
  Administered 2020-02-27: 10 mg via INTRAMUSCULAR
  Filled 2020-02-27: qty 1

## 2020-02-27 MED ORDER — PREDNISONE 20 MG PO TABS: 40.0000 mg | ORAL_TABLET | Freq: Every day | ORAL | 0 refills | Status: DC

## 2020-02-27 MED ORDER — METHYL SALICYLATE-LIDO-MENTHOL 4-4-5 % EX PTCH: 1.0000 | MEDICATED_PATCH | Freq: Two times a day (BID) | CUTANEOUS | 0 refills | Status: DC

## 2020-02-27 MED ORDER — DICLOFENAC EPOLAMINE 1.3 % EX PTCH
1.0000 | MEDICATED_PATCH | Freq: Two times a day (BID) | CUTANEOUS | Status: DC
  Administered 2020-02-27: 1 via TRANSDERMAL
  Filled 2020-02-27: qty 1

## 2020-02-27 NOTE — Discharge Instructions (Signed)
As discussed, today's evaluation has been generally reassuring. Your pain is likely due to the sudden inflammation of the nerves exiting your spinal canal and controlling your left leg.  Typically this type of pain improves over several days with steroids, and anti-inflammatory patches. Please obtain and use your medications as directed and be sure to follow-up with your orthopedic physician.  Return here for concerning changes in your condition.

## 2020-02-27 NOTE — ED Triage Notes (Signed)
Per patient, states she was putting her panty hose on when she heard her left hip pop-unable to bear weight at this time

## 2020-02-27 NOTE — ED Provider Notes (Signed)
[facility] DEPT Provider Note   CSN: [id number] Arrival date & time: 02/27/20  1417     History Chief Complaint  Patient presents with  . Hip Pain    Olivia Bender is a 69 y.o. female.  HPI    Patient presents with concern of pain in the left gluteus region, hip. Patient was in her usual state of health until just prior to ED transport.  Line she notes that she was reaching down to pull on clothing, to go to church.  She felt sudden onset of pain shooting on the left gluteus, left hip.  She notes that she heard a discernible pop, when the pain began.  Since that time she has been able to walk, contrary to nursing notes, though with substantial amounts of pain, with leg motion. No subsequent fall, no other injuries No medication taken for relief. Patient has no history of back surgery, hip surgery, has had multiple orthopedic steroid injections in her left ankle, left knee.  Past Medical History:  Diagnosis Date  . Allergy   . Anxiety   . Arthritis   . Constipation   . Enlarged liver   . Generalized headaches    since teenage years- usually can get away with tylenol  . Hyperlipidemia   . Osteopenia   . Thyroid disease    hypothyroidism    Patient Active Problem List   Diagnosis Date Noted  . Pulmonary nodule/lesion, solitary 02/21/2020  . Essential hypertension 12/10/2019  . Aortic atherosclerosis ([place]) 12/11/2018  . 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 Asthma 11/21/2017  . Generalized headaches   . Trochanteric bursitis of left hip 12/11/2015  . Fatty infiltration of liver 12/30/2014  . Statin intolerance 06/30/2014  . Left knee pain 06/30/2014  . Prediabetes 06/30/2014  . Hypothyroidism 12/01/2010  . Hyperlipidemia 12/01/2010  . Osteopenia 12/01/2010  . Allergic rhinitis 12/01/2010  . GE reflux 12/01/2010    Past Surgical History:  Procedure Laterality Date  . arthroscopic left knee surgery    . DILATION  AND CURETTAGE OF UTERUS     monthlong period related. everything was ok  . NASAL SINUS SURGERY  1986     OB History   No obstetric history on file.     Family History  Problem Relation Age of Onset  . Stroke Mother        26. never smoker  . Hypertension Mother   . Other Father        killed in car wreck 2012  . Prostate cancer Father   . Obesity Sister   . Kidney cancer Son   . Heart disease Maternal Grandmother   . Heart disease Maternal Grandfather     Social History   Tobacco Use  . Smoking status: Never Smoker  . Smokeless tobacco: Never Used  Substance Use Topics  . Alcohol use: No  . Drug use: Never    Home Medications Prior to Admission medications   Medication Sig Start Date End Date Taking? Authorizing Provider  albuterol (VENTOLIN HFA) 108 (90 Base) MCG/ACT inhaler  06/25/19   [provider]  aspirin 81 MG EC tablet Take 81 mg by mouth daily.      [provider]  Azelastine HCl 137 MCG/SPRAY SOLN Place into the nose.    [provider]  cyclobenzaprine (FLEXERIL) 10 MG tablet Take 10 mg by mouth 3 (three) times daily as needed.      [provider]  levocetirizine (XYZAL) 5 MG tablet Take 5 mg by mouth every evening. Prn only    [provider]  levothyroxine (SYNTHROID) 75 MCG tablet Take 1 tablet (75 mcg total) by mouth daily. 06/28/19   Marin Olp, MD  omeprazole (PRILOSEC OTC) 20 MG tablet Take 20 mg by mouth daily.    [provider]  sertraline (ZOLOFT) 50 MG tablet TAKE 1 TABLET BY MOUTH EVERY DAY 10/07/19   Marin Olp, MD  triamcinolone (NASACORT ALLERGY 24HR) 55 MCG/ACT AERO nasal inhaler Place 2 sprays into the nose daily.    [provider]    Allergies    Tetanus toxoids, Latex, Ezetimibe, and Statins  Review of Systems   Review of Systems  Constitutional:       Per HPI, otherwise negative  HENT:       Per HPI, otherwise negative  Respiratory:       Per HPI,  otherwise negative  Cardiovascular:       Per HPI, otherwise negative  Gastrointestinal: Negative for vomiting.  Endocrine:       Negative aside from HPI  Genitourinary:       Neg aside from HPI   Musculoskeletal:       Per HPI, otherwise negative  Skin: Negative.   Neurological: Negative for syncope.    Physical Exam Updated Vital Signs BP 132/79 (BP Location: Left Arm)   Pulse 82   Temp 98.1 F (36.7 C) (Oral)   Resp 18   Ht 5\' 1"  (1.549 m)   Wt 90.1 kg   SpO2 100%   BMI 37.53 kg/m   Physical Exam Vitals and nursing note reviewed.  Constitutional:      General: She is not in acute distress.    Appearance: She is well-developed.  HENT:     Head: Normocephalic and atraumatic.  Eyes:     Conjunctiva/sclera: Conjunctivae normal.  Cardiovascular:     Rate and Rhythm: Normal rate and regular rhythm.  Pulmonary:     Effort: Pulmonary effort is normal. No respiratory distress.     Breath sounds: Normal breath sounds. No stridor.  Abdominal:     General: There is no distension.  Musculoskeletal:       Legs:  Skin:    General: Skin is warm and dry.  Neurological:     Mental Status: She is alert and oriented to person, place, and time.     Cranial Nerves: No cranial nerve deficit.     ED Results / Procedures / Treatments    Radiology DG Hip Unilat With Pelvis 2-3 Views Left  Result Date: 02/27/2020 CLINICAL DATA:  Hip pain, no known injury, initial encounter EXAM: DG HIP (WITH OR WITHOUT PELVIS) 3V LEFT COMPARISON:  None. FINDINGS: Pelvic ring is intact. No acute fracture or dislocation is noted. No soft tissue abnormality is seen. Degenerative changes of lower lumbar spine are noted. IMPRESSION: No acute abnormality noted. Electronically Signed   By: Inez Catalina M.D.   On: 02/27/2020 15:36    Procedures Procedures (including critical care time)  Medications Ordered in ED Medications  dexamethasone (DECADRON) injection 10 mg (has no administration in time  range)  diclofenac (FLECTOR) 1.3 % 1 patch (has no administration in time range)    ED Course  I have reviewed the triage vital signs and the nursing notes.  Pertinent labs & imaging results that were available during my care of the patient were reviewed by me and considered in my medical decision  making (see chart for details).  Adult female presents after atraumatic onset left SI/gluteal pain. Patient's description of pain was sudden onset, popping sensation suggests acute musculoskeletal/radicular lesion, with no demonstration of loss of neurovascular status, or distress. Patient started on steroids, topical anti-inflammatories, appropriate for, amenable to outpatient follow-up with orthopedics. Final Clinical Impression(s) / ED Diagnoses Final diagnoses:  Left hip pain    Rx / DC Orders ED Discharge Orders         Ordered    predniSONE (DELTASONE) 20 MG tablet  Daily with breakfast        02/27/20 3979    Methyl Salicylate-Lido-Menthol 4-4-5 % PTCH  2 times daily        02/27/20 1744           Carmin Muskrat, MD 02/27/20 2305

## 2020-03-01 DIAGNOSIS — M7062 Trochanteric bursitis, left hip: Secondary | ICD-10-CM | POA: Diagnosis not present

## 2020-03-01 DIAGNOSIS — M25552 Pain in left hip: Secondary | ICD-10-CM | POA: Diagnosis not present

## 2020-03-13 NOTE — Progress Notes (Signed)
Phone [phone] In person visit   Subjective:   Olivia Bender is a 69 y.o. year old very pleasant female patient who presents for/with See problem oriented charting Chief Complaint  Patient presents with   Asthma   Hypertension   Hypothyroidism   This visit occurred during the SARS-CoV-2 public health emergency.  Safety protocols were in place, including screening questions prior to the visit, additional usage of staff PPE, and extensive cleaning of exam room while observing appropriate contact time as indicated for disinfecting solutions.   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[place]) 05/18/2018    Priority: Medium   Asthma 11/21/2017    Priority: Medium   Generalized headaches     Priority: Medium   Fatty infiltration of liver 12/30/2014    Priority: Medium   Prediabetes 06/30/2014    Priority: Medium   Hypothyroidism 12/01/2010    Priority: Medium   Hyperlipidemia 12/01/2010    Priority: Medium   Osteopenia 12/01/2010    Priority: Medium   GE reflux 12/01/2010    Priority: Medium   Trochanteric bursitis of left hip 12/11/2015    Priority: Low   Statin intolerance 06/30/2014    Priority: Low   Left knee pain 06/30/2014    Priority: Low   Allergic rhinitis 12/01/2010    Priority: Low   Pulmonary nodule/lesion, solitary 02/21/2020   Aortic atherosclerosis (Olivia Bender) 12/11/2018    Medications- reviewed and updated Current Outpatient Medications  Medication Sig Dispense Refill   albuterol (VENTOLIN HFA) 108 (90 Base) MCG/ACT inhaler      aspirin 81 MG EC tablet Take 81 mg by mouth daily.       Azelastine HCl 137 MCG/SPRAY SOLN Place into the nose.     cyclobenzaprine (FLEXERIL) 10 MG tablet Take 10 mg by mouth 3 (three) times daily as needed.       levocetirizine (XYZAL) 5 MG tablet Take 5 mg by mouth every  evening. Prn only     levothyroxine (SYNTHROID) 75 MCG tablet Take 1 tablet (75 mcg total) by mouth daily. 90 tablet 3   omeprazole (PRILOSEC OTC) 20 MG tablet Take 20 mg by mouth daily.     sertraline (ZOLOFT) 50 MG tablet TAKE 1 TABLET BY MOUTH EVERY DAY 90 tablet 3   triamcinolone (NASACORT ALLERGY 24HR) 55 MCG/ACT AERO nasal inhaler Place 2 sprays into the nose daily.     Methyl Salicylate-Lido-Menthol 4-4-5 % PTCH Apply 1 patch topically in the morning and at bedtime. 30 patch 0   No current facility-administered medications for this visit.     Objective:  BP 118/90    Pulse 96    Temp (!) 97.1 F (36.2 C) (Temporal)    Resp 18    Ht 5\' 1"  (1.549 m)    Wt 196 lb 12.8 oz (89.3 kg)    SpO2 98%    BMI 37.19 kg/m  Gen: NAD, resting comfortably CV: RRR no murmurs rubs or gallops Lungs: CTAB no crackles, wheeze, rhonchi  Ext: no edema Skin: warm, dry Neuro: CN II-XII intact, sensation and reflexes normal throughout, 5/5 muscle strength in bilateral upper and lower extremities. Normal finger to nose. Normal rapid alternating movements. No pronator drift. Normal romberg. Normal gait.      Assessment and Plan   # Headache in top right scalp S:2 months of symptoms. Lasts for a  few seconds and is sharp. Episodes happen 2-3x a week. Not increasing in intensity. No clear trigger. No vision changes. No family history brain cancer or aneurysms  A/P: reassuring neuro exam. 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 Not obviously occipital neuralgia. Headaches Very infrequent and short lived- she agrees if headaches intensify or become more common ot let me know and we will order brain MRI- monitor only for now.   - offered MRI at this time and she declines for now- concerned about cost  #mild anemia last visit- updated colonoscopy reassuring- make sure hgb  stable with labs  # Left hip pain- 02/27/20- improving after prednisone and then had follow up with Dr. Alvan Dame and had injection of steroid and had another injection with podiatry in last few months.   #hypertension S: medication: None-new diagnosis with weight gain back in July after 30 pound weight gain last year-she has gained 1 pound since last visit with me Home readings #s: 120/80 yesteday and 115/78 today. occaisonally over 140 previously or over 90 but average less than 140/90 BP Readings from Last 3 Encounters:  03/14/20 118/90  02/27/20 132/79  02/21/20 134/78  A/P: slight elevation in office today but home #s overall very ecouraging. Also has had some good numbers at other offices and even in the emergency room. Does still get some readings above 140/90 at home but average is less than that. I think we have space to continue to work on healthy eating/regular exercise/weight loss- she has had a lot of steroids lately which have likely been a barrier and hopefully she will feel bette rand stay off steroids and we can see improvement  #hypothyroidism S: compliant On thyroid medication-levothyroxine 75 mcg Lab Results  Component Value Date   TSH 1.58 12/10/2019   A/P:Well-controlled on last check-continue current medication  #hyperlipidemia S: Medication:Poorly controlled with no statin.  Has failed Crestor twice a week with coenzyme Q 10, Zetia, Lipitor, Livalo, Crestor once a week.  Did well with Repatha but is not affordable for her. Lab Results  Component Value Date   CHOL 171 12/10/2019   HDL 41 (L) 12/10/2019   LDLCALC 96 12/10/2019   LDLDIRECT 184.0 11/21/2017   TRIG 227 (H) 12/10/2019   CHOLHDL 4.2 12/10/2019   A/P: poor control but statin intolerant and cannot afford repatha  # Hyperglycemia/insulin resistance/prediabetes S:  Medication: none Lab Results  Component Value Date   HGBA1C 5.4 12/10/2019   HGBA1C 5.5 05/18/2018   HGBA1C 5.6 05/22/2017   A/P: a1cs have  been good even if sugars have been high in past- with recent steroids opted to not check today  #Pulmonary nodule May 2021 with plan for 68-month repeat-she is scheduled to see Dr. Lamonte Sakai October 21 for follow-up  Recommended follow up: Return in about 6 months (around 09/12/2020) for follow up- or sooner if needed. Future Appointments  Date Time Provider [place]  03/23/2020 11:00 AM [facility] None  03/23/2020 12:00 PM Collene Gobble, MD [facility] None  03/28/2020  2:15 PM Hyatt, Max T, DPM [facility]  01/26/2021 11:45 AM [facility]    Lab/Order associations:   ICD-10-CM   1. Essential hypertension  I10 CBC With Differential/Platelet  2. Hypothyroidism, unspecified type  E03.9   3. Hyperlipidemia, unspecified hyperlipidemia type  E78.5   4. Major depressive disorder with single  episode, in full remission ([place])  F32.5     No orders of the defined types were placed in this encounter.   Return precautions advised.  Garret Reddish, MD

## 2020-03-14 ENCOUNTER — Other Ambulatory Visit: Payer: Self-pay

## 2020-03-14 ENCOUNTER — Ambulatory Visit (INDEPENDENT_AMBULATORY_CARE_PROVIDER_SITE_OTHER): Payer: Medicare Other | Admitting: Family Medicine

## 2020-03-14 ENCOUNTER — Encounter: Payer: Self-pay | Admitting: Family Medicine

## 2020-03-14 VITALS — BP 118/90 | HR 96 | Temp 97.1°F | Resp 18 | Ht 61.0 in | Wt 196.8 lb

## 2020-03-14 DIAGNOSIS — J4520 Mild intermittent asthma, uncomplicated: Secondary | ICD-10-CM

## 2020-03-14 DIAGNOSIS — E785 Hyperlipidemia, unspecified: Secondary | ICD-10-CM | POA: Diagnosis not present

## 2020-03-14 DIAGNOSIS — I10 Essential (primary) hypertension: Secondary | ICD-10-CM | POA: Diagnosis not present

## 2020-03-14 DIAGNOSIS — Z23 Encounter for immunization: Secondary | ICD-10-CM

## 2020-03-14 DIAGNOSIS — E039 Hypothyroidism, unspecified: Secondary | ICD-10-CM

## 2020-03-14 DIAGNOSIS — F325 Major depressive disorder, single episode, in full remission: Secondary | ICD-10-CM

## 2020-03-14 NOTE — Addendum Note (Signed)
Addended by: Thomes Cake on: 03/14/2020 11:19 AM   Modules accepted: Orders

## 2020-03-14 NOTE — Patient Instructions (Addendum)
Health Maintenance Due  Topic Date Due  . INFLUENZA VACCINE In office flu shot  High dose 01/02/2020    Headaches Very infrequent and short lived- she agrees if headaches intensify or become more common ot let me know and we will order brain MRI- monitor only for now.     Please stop by lab before you go If you have mychart- we will send your results within 3 business days of [place] receiving them.  If you do not have mychart- we will call you about results within 5 business days of [place] receiving them.  *please note we are currently using [facility] which has a longer processing time than [place] typically so labs may not come back as quickly as in the past *please also note that you will see labs on mychart as soon as they post. I will later go in and write notes on them- will say "notes from Dr. Yong Channel"

## 2020-03-15 LAB — CBC WITH DIFFERENTIAL/PLATELET
Absolute Monocytes: 747 cells/uL (ref 200–950)
Basophils Absolute: 33 cells/uL (ref 0–200)
Basophils Relative: 0.4 %
Eosinophils Absolute: 166 cells/uL (ref 15–500)
Eosinophils Relative: 2 %
HCT: 37.1 % (ref 35.0–45.0)
Hemoglobin: 12.4 g/dL (ref 11.7–15.5)
Lymphs Abs: 1768 cells/uL (ref 850–3900)
MCH: 31.3 pg (ref 27.0–33.0)
MCHC: 33.4 g/dL (ref 32.0–36.0)
MCV: 93.7 fL (ref 80.0–100.0)
MPV: 10.9 fL (ref 7.5–12.5)
Monocytes Relative: 9 %
Neutro Abs: 5586 cells/uL (ref 1500–7800)
Neutrophils Relative %: 67.3 %
Platelets: 268 10*3/uL (ref 140–400)
RBC: 3.96 10*6/uL (ref 3.80–5.10)
RDW: 13.1 % (ref 11.0–15.0)
Total Lymphocyte: 21.3 %
WBC: 8.3 10*3/uL (ref 3.8–10.8)

## 2020-03-15 LAB — TIQ-MISC

## 2020-03-23 ENCOUNTER — Ambulatory Visit (INDEPENDENT_AMBULATORY_CARE_PROVIDER_SITE_OTHER): Payer: Medicare Other | Admitting: Emergency Medicine

## 2020-03-23 ENCOUNTER — Encounter: Payer: Self-pay | Admitting: Emergency Medicine

## 2020-03-23 ENCOUNTER — Other Ambulatory Visit: Payer: Self-pay

## 2020-03-23 DIAGNOSIS — R911 Solitary pulmonary nodule: Secondary | ICD-10-CM | POA: Diagnosis not present

## 2020-03-23 DIAGNOSIS — J4520 Mild intermittent asthma, uncomplicated: Secondary | ICD-10-CM | POA: Diagnosis not present

## 2020-03-23 LAB — PULMONARY FUNCTION TEST
DL/VA % pred: 124 %
DL/VA: 5.3 ml/min/mmHg/L
DLCO cor % pred: 100 %
DLCO cor: 17.69 ml/min/mmHg
DLCO unc % pred: 97 %
DLCO unc: 17.12 ml/min/mmHg
FEF 25-75 Post: 2.93 L/sec
FEF 25-75 Pre: 1.97 L/sec
FEF2575-%Change-Post: 48 %
FEF2575-%Pred-Post: 165 %
FEF2575-%Pred-Pre: 111 %
FEV1-%Change-Post: 11 %
FEV1-%Pred-Post: 90 %
FEV1-%Pred-Pre: 80 %
FEV1-Post: 1.82 L
FEV1-Pre: 1.63 L
FEV1FVC-%Change-Post: 6 %
FEV1FVC-%Pred-Pre: 112 %
FEV6-%Change-Post: 5 %
FEV6-%Pred-Post: 78 %
FEV6-%Pred-Pre: 74 %
FEV6-Post: 2 L
FEV6-Pre: 1.89 L
FEV6FVC-%Pred-Post: 104 %
FEV6FVC-%Pred-Pre: 104 %
FVC-%Change-Post: 5 %
FVC-%Pred-Post: 75 %
FVC-%Pred-Pre: 71 %
FVC-Post: 2.02 L
FVC-Pre: 1.92 L
Post FEV1/FVC ratio: 91 %
Post FEV6/FVC ratio: 100 %
Pre FEV1/FVC ratio: 85 %
Pre FEV6/FVC Ratio: 100 %
RV % pred: 79 %
RV: 1.6 L
TLC % pred: 77 %
TLC: 3.59 L

## 2020-03-23 NOTE — Patient Instructions (Addendum)
We will plan to repeat your CT scan of the chest in February 2022. Keep your albuterol available use 2 puffs when you need it for shortness of breath, chest tightness, wheezing. Get your COVID-19 booster shot as planned Flu shot up-to-date Follow with Dr. Lamonte Sakai in February 2022 after your CT scan so that we can review the results together.

## 2020-03-23 NOTE — Progress Notes (Signed)
Subjective:    Patient ID: Olivia Bender, female    DOB: September 23, 1950, 69 y.o.   MRN: [id number]  HPI 69 year old never smoker with a history of hyperlipidemia, hypothyroidism, allergies and suspected asthma. She had COVID in Jan-Feb 2021 with B infiltrates. She is referred today to follow a pulmonary nodule. She underwent cardiac CT scoring 07/29/2018 that I reviewed, showed a coronary calcium score of 7.  The lung windows revealed a 10 mm right lower lobe pulmonary nodule, somewhat irregular.  Follow-up CT 12/11/2018 and 10/14/2019 reviewed showed an 8 to 10 mm nodule, volume average 9 mm in the right lower lobe.  Stability going back to 07/2018 (15 months). Her infiltrates from COVID resolved.   She feels well, no dyspnea. She has some dry cough, has allergies and follows with Dr Fredderick Phenix. Uses albuterol rarely - few times a month.   ROV 03/23/20 --pleasant 69 year old woman, never smoker who follows up today for her history of allergic rhinitis, prior Covid pneumonia February 2021, 9 mm right lower lobe pulmonary nodule.  She has also been treated for suspected asthma, has albuterol which she uses rarely. She does have some exertional SOB when climbing incline.   Pulmonary function testing done today 03/23/2020 reviewed by me, shows a restrictive pattern with possible coexisting obstruction and a borderline improvement following bronchodilator.  The lung volumes confirm restriction, her diffusion capacity is normal.    Review of Systems As per HPI  Past Medical History:  Diagnosis Date  . Allergy   . Anxiety   . Arthritis   . Constipation   . Enlarged liver   . Generalized headaches    since teenage years- usually can get away with tylenol  . Hyperlipidemia   . Osteopenia   . Thyroid disease    hypothyroidism     Family History  Problem Relation Age of Onset  . Stroke Mother        50. never smoker  . Hypertension Mother   . Other Father        killed in car wreck 2012  .  Prostate cancer Father   . Obesity Sister   . Kidney cancer Son   . Heart disease Maternal Grandmother   . Heart disease Maternal Grandfather      Social History   Socioeconomic History  . Marital status: Married    Spouse name: Not on file  . Number of children: Not on file  . Years of education: Not on file  . Highest education level: Not on file  Occupational History  . Occupation: Retired  Tobacco Use  . Smoking status: Never Smoker  . Smokeless tobacco: Never Used  Substance and Sexual Activity  . Alcohol use: No  . Drug use: Never  . Sexual activity: Not on file  Other Topics Concern  . Not on file  Social History Narrative   Married. 5 kids total between her and husband.  One son is a Mudlogger.      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        Hobbies: travel but hsuband ill   Social Determinants of Radio broadcast assistant Strain: Low Risk   . Difficulty of Paying Living Expenses: Not hard at all  Food Insecurity: No Food Insecurity  . Worried About Charity fundraiser in the Last Year: Never true  . Ran Out of Food in the Last Year: Never true  Transportation Needs: No Transportation Needs  . Lack of Transportation (Medical): No  . Lack of Transportation (Non-Medical): No  Physical Activity: Inactive  . Days of Exercise per Week: 0 days  . Minutes of Exercise per Session: 0 min  Stress: No Stress Concern Present  . Feeling of Stress : Not at all  Social Connections: Moderately Integrated  . Frequency of Communication with Friends and Family: Twice a week  . Frequency of Social Gatherings with Friends and Family: Twice a week  . Attends Religious Services: More than 4 times per year  . Active Member of Clubs or Organizations: No  . Attends Archivist Meetings: Never  . Marital Status: Married  Human resources officer Violence: Not At Risk  . Fear of Current or Ex-Partner: No  .  Emotionally Abused: No  . Physically Abused: No  . 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    Allergies  Allergen Reactions  . Tetanus Toxoids Anaphylaxis  . Latex Rash  . Ezetimibe Other (See Comments)    Myalgia  . Statins Other (See Comments)    Lipitor, Crestor, Livalo -myalgias     Outpatient Medications Prior to Visit  Medication Sig Dispense Refill  . albuterol (VENTOLIN HFA) 108 (90 Base) MCG/ACT inhaler     . aspirin 81 MG EC tablet Take 81 mg by mouth daily.      . cyclobenzaprine (FLEXERIL) 10 MG tablet Take 10 mg by mouth 3 (three) times daily as needed.      Marland Kitchen levocetirizine (XYZAL) 5 MG tablet Take 5 mg by mouth every evening. Prn only    . levothyroxine (SYNTHROID) 75 MCG tablet Take 1 tablet (75 mcg total) by mouth daily. 90 tablet 3  . omeprazole (PRILOSEC OTC) 20 MG tablet Take 20 mg by mouth daily.    . sertraline (ZOLOFT) 50 MG tablet TAKE 1 TABLET BY MOUTH EVERY DAY 90 tablet 3  . Azelastine HCl 137 MCG/SPRAY SOLN Place into the nose. (Patient not taking: Reported on 03/23/2020)    . Methyl Salicylate-Lido-Menthol 4-4-5 % PTCH Apply 1 patch topically in the morning and at bedtime. 30 patch 0  . triamcinolone (NASACORT ALLERGY 24HR) 55 MCG/ACT AERO nasal inhaler Place 2 sprays into the nose daily. (Patient not taking: Reported on 03/23/2020)     No facility-administered medications prior to visit.        Objective:   Physical Exam Vitals:   03/23/20 1156  BP: 110/70  Pulse: 97  Temp: (!) 97 F (36.1 C)  TempSrc: Temporal  SpO2: 97%  Weight: 199 lb (90.3 kg)  Height: 5\' 1"  (1.549 m)   Gen: Pleasant, obese, in no distress,  normal affect  ENT: No lesions,  mouth clear,  oropharynx clear, no postnasal drip  Neck: No JVD, no stridor  Lungs: No use of  accessory muscles, no crackles or wheezing on normal respiration, no wheeze on forced expiration  Cardiovascular: RRR, heart sounds normal, no murmur or gallops, no peripheral edema  Musculoskeletal: No deformities, no cyanosis or clubbing  Neuro: alert, awake, non focal  Skin: Warm, no lesions or rash      Assessment & Plan:  Pulmonary nodule/lesion, solitary Has been stable for 15 months.  We will plan to repeat scan in February 2022 to  look for 2 years of stability.  At that point we may be able to stop following if unchanged.  Asthma Pulmonary function testing shows restriction likely due to obesity but also some evidence for mild obstruction based on a borderline response to bronchodilator.  I think she probably does have mild asthma.  She is doing well using albuterol as needed.  We will continue this without any scheduled BD.  Baltazar Apo, MD, PhD 03/23/2020, 12:24 PM [facility] [phone] or if no answer [phone]

## 2020-03-23 NOTE — Addendum Note (Signed)
Addended by: Gavin Potters R on: 03/23/2020 04:11 PM   Modules accepted: Orders

## 2020-03-23 NOTE — Assessment & Plan Note (Signed)
Has been stable for 15 months.  We will plan to repeat scan in February 2022 to look for 2 years of stability.  At that point we may be able to stop following if unchanged.

## 2020-03-23 NOTE — Progress Notes (Signed)
Full PFT performed today. °

## 2020-03-23 NOTE — Assessment & Plan Note (Signed)
Pulmonary function testing shows restriction likely due to obesity but also some evidence for mild obstruction based on a borderline response to bronchodilator.  I think she probably does have mild asthma.  She is doing well using albuterol as needed.  We will continue this without any scheduled BD.

## 2020-03-28 ENCOUNTER — Ambulatory Visit: Payer: Medicare Other | Admitting: Podiatry

## 2020-04-03 ENCOUNTER — Telehealth: Payer: Self-pay | Admitting: Emergency Medicine

## 2020-04-03 NOTE — Telephone Encounter (Signed)
I called [facility] and spoke with Prentiss Bells who scheduled the CT for the patient and explained that the CT was not due until 07/2020. Prentiss Bells stated she would call the patient back and reschedule the CT

## 2020-04-03 NOTE — Telephone Encounter (Signed)
I have left a message for the patient to explain [facility] scheduling the CT too soon

## 2020-04-14 ENCOUNTER — Other Ambulatory Visit: Payer: Medicare Other

## 2020-06-08 ENCOUNTER — Ambulatory Visit: Payer: Medicare Other | Attending: Internal Medicine

## 2020-06-08 DIAGNOSIS — Z23 Encounter for immunization: Secondary | ICD-10-CM

## 2020-06-08 NOTE — Progress Notes (Signed)
   Covid-19 Vaccination Clinic  Name:  Olivia Bender    MRN: [id number] DOB: 02-26-1951  06/08/2020  [name] was observed post Covid-19 immunization for 15 minutes without incident. She was provided with Vaccine Information Sheet and instruction to access the V-Safe system.   [name] was instructed to call 911 with any severe reactions post vaccine: Marland Kitchen Difficulty breathing  . Swelling of face and throat  . A fast heartbeat  . A bad rash all over body  . Dizziness and weakness   Immunizations Administered    Name Date Dose VIS Date Route   Pfizer COVID-19 Vaccine 06/08/2020  1:34 PM 0.3 mL 03/22/2020 Intramuscular   Manufacturer: [employer], [employer]   Lot: [id number]   NDC: [address]-0

## 2020-06-15 ENCOUNTER — Ambulatory Visit: Payer: Medicare Other

## 2020-06-29 ENCOUNTER — Other Ambulatory Visit: Payer: Self-pay | Admitting: Family Medicine

## 2020-07-10 ENCOUNTER — Other Ambulatory Visit: Payer: Medicare Other

## 2020-07-25 ENCOUNTER — Ambulatory Visit
Admission: RE | Admit: 2020-07-25 | Discharge: 2020-07-25 | Disposition: A | Discharge status: Home / Self Care | Payer: Medicare Other | Source: Ambulatory Visit | Attending: Emergency Medicine | Admitting: Emergency Medicine

## 2020-07-25 ENCOUNTER — Other Ambulatory Visit: Payer: Self-pay

## 2020-07-25 DIAGNOSIS — J9811 Atelectasis: Secondary | ICD-10-CM | POA: Diagnosis not present

## 2020-07-25 DIAGNOSIS — I2510 Atherosclerotic heart disease of native coronary artery without angina pectoris: Secondary | ICD-10-CM | POA: Diagnosis not present

## 2020-07-25 DIAGNOSIS — I700 Atherosclerosis of aorta: Secondary | ICD-10-CM | POA: Diagnosis not present

## 2020-07-25 DIAGNOSIS — R911 Solitary pulmonary nodule: Secondary | ICD-10-CM

## 2020-08-29 ENCOUNTER — Telehealth: Payer: Self-pay

## 2020-08-29 DIAGNOSIS — E785 Hyperlipidemia, unspecified: Secondary | ICD-10-CM

## 2020-08-29 MED ORDER — PRALUENT 150 MG/ML ~~LOC~~ SOAJ: 150.0000 mg | SUBCUTANEOUS | 11 refills | Status: DC

## 2020-08-29 NOTE — Telephone Encounter (Signed)
Called in and stated that they would like to go back on the cholesterol shots so I did a pa for praluent 150 on cmm [place]: [id number]. I also instructed the pt to apply for healthwell and the pt voiced understanding. 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

## 2020-08-29 NOTE — Telephone Encounter (Signed)
Called and lmom pt stated that they were approved for the praluent 150, rx sent, pt instructed to complete fasting lipids after 4th dose and to apply for the healthwell foundation. (labs ordered)

## 2020-08-30 NOTE — Telephone Encounter (Signed)
Patient approved for Praluent 150mg /mL from 07/30/20 until further notice Approved for 2 per 28 days

## 2020-09-01 ENCOUNTER — Telehealth: Payer: Self-pay | Admitting: Family Medicine

## 2020-09-01 NOTE — Chronic Care Management (AMB) (Signed)
  Chronic Care Management   Note  09/01/2020 Name: Olivia Bender MRN: [id number] DOB: 1951-01-05  Olivia Bender is a 70 y.o. year old female who is a primary care patient of Hunter, Brayton Mars, MD. I reached out to [name] by phone today in response to a referral sent by [name]'s PCP, Marin Olp, MD.   Olivia Bender was given information about Chronic Care Management services today including:  1. CCM service includes personalized support from designated clinical staff supervised by her physician, including individualized plan of care and coordination with other care providers 2. 24/7 contact phone numbers for assistance for urgent and routine care needs. 3. Service will only be billed when office clinical staff spend 20 minutes or more in a month to coordinate care. 4. Only one practitioner may furnish and bill the service in a calendar month. 5. The patient may stop CCM services at any time (effective at the end of the month) by phone call to the office staff.   Patient agreed to services and verbal consent obtained.   Follow up plan:   Lauretta Grill Upstream Scheduler

## 2020-09-12 ENCOUNTER — Ambulatory Visit: Payer: Medicare Other | Admitting: Family Medicine

## 2020-09-12 NOTE — Patient Instructions (Addendum)
[email or web address]  - she specializes in TMJD and with your continued issues would be worth chatting with her  Try myfitnesspal again- this can be very helpful for weight loss  If bp >135/85 at home let me know- may be reasonable if only slightly high to work on weight loss first before starting meds  Recommended follow up: Return in about 6 months (around 03/15/2021) for follow up- or sooner if needed.    [email or web address]">  DASH Eating Plan DASH stands for Dietary Approaches to Stop Hypertension. The DASH eating plan is a healthy eating plan that has been shown to:  Reduce high blood pressure (hypertension).  Reduce your risk for type 2 diabetes, heart disease, and stroke.  Help with weight loss. What are tips for following this plan? Reading food labels  Check food labels for the amount of salt (sodium) per serving. Choose foods with less than 5 percent of the Daily Value of sodium. Generally, foods with less than 300 milligrams (mg) of sodium per serving fit into this eating plan.  To find whole grains, look for the word "whole" as the first word in the ingredient list. Shopping  Buy products labeled as "low-sodium" or "no salt added."  Buy fresh foods. Avoid canned foods and pre-made or frozen meals. Cooking  Avoid adding salt when cooking. Use salt-free seasonings or herbs instead of table salt or sea salt. Check with your health care provider or pharmacist before using salt substitutes.  Do not fry foods. Cook foods using healthy methods such as baking, boiling, grilling, roasting, and broiling instead.  Cook with heart-healthy oils, such as olive, canola, avocado, soybean, or sunflower oil. Meal planning  Eat a balanced diet that includes: ? 4 or more servings of fruits and 4 or more servings of vegetables each day. Try to fill one-half of your plate with fruits and vegetables. ? 6-8 servings of whole grains each  day. ? Less than 6 oz (170 g) of lean meat, poultry, or fish each day. A 3-oz (85-g) serving of meat is about the same size as a deck of cards. One egg equals 1 oz (28 g). ? 2-3 servings of low-fat dairy each day. One serving is 1 cup (237 mL). ? 1 serving of nuts, seeds, or beans 5 times each week. ? 2-3 servings of heart-healthy fats. Healthy fats called omega-3 fatty acids are found in foods such as walnuts, flaxseeds, fortified milks, and eggs. These fats are also found in cold-water fish, such as sardines, salmon, and mackerel.  Limit how much you eat of: ? Canned or prepackaged foods. ? Food that is high in trans fat, such as some fried foods. ? Food that is high in saturated fat, such as fatty meat. ? Desserts and other sweets, sugary drinks, and other foods with added sugar. ? Full-fat dairy products.  Do not salt foods before eating.  Do not eat more than 4 egg yolks a week.  Try to eat at least 2 vegetarian meals a week.  Eat more home-cooked food and less restaurant, buffet, and fast food.   Lifestyle  When eating at a restaurant, ask that your food be prepared with less salt or no salt, if possible.  If you drink alcohol: ? Limit how much you use to:  0-1 drink a day for women who are not pregnant.  0-2 drinks a day for men. ? Be aware of how much alcohol is in your drink. In the [place] one drink equals one 12 oz bottle  of beer (355 mL), one 5 oz glass of wine (148 mL), or one 1 oz glass of hard liquor (44 mL). General information  Avoid eating more than 2,300 mg of salt a day. If you have hypertension, you may need to reduce your sodium intake to 1,500 mg a day.  Work with your health care provider to maintain a healthy body weight or to lose weight. Ask what an ideal weight is for you.  Get at least 30 minutes of exercise that causes your heart to beat faster (aerobic exercise) most days of the week. Activities may include walking, swimming, or biking.  Work with  your health care provider or dietitian to adjust your eating plan to your individual calorie needs. What foods should I eat? Fruits All fresh, dried, or frozen fruit. Canned fruit in natural juice (without added sugar). Vegetables Fresh or frozen vegetables (raw, steamed, roasted, or grilled). Low-sodium or reduced-sodium tomato and vegetable juice. Low-sodium or reduced-sodium tomato sauce and tomato paste. Low-sodium or reduced-sodium canned vegetables. Grains Whole-grain or whole-wheat bread. Whole-grain or whole-wheat pasta. Brown rice. [place]row. Bulgur. Whole-grain and low-sodium cereals. Pita bread. Low-fat, low-sodium crackers. Whole-wheat flour tortillas. Meats and other proteins Skinless chicken or [place]. Ground chicken or [place]. Pork with fat trimmed off. Fish and seafood. Egg whites. Dried beans, peas, or lentils. Unsalted nuts, nut butters, and seeds. Unsalted canned beans. Lean cuts of beef with fat trimmed off. Low-sodium, lean precooked or cured meat, such as sausages or meat loaves. Dairy Low-fat (1%) or fat-free (skim) milk. Reduced-fat, low-fat, or fat-free cheeses. Nonfat, low-sodium ricotta or cottage cheese. Low-fat or nonfat yogurt. Low-fat, low-sodium cheese. Fats and oils Soft margarine without trans fats. Vegetable oil. Reduced-fat, low-fat, or light mayonnaise and salad dressings (reduced-sodium). Canola, safflower, olive, avocado, soybean, and sunflower oils. Avocado. Seasonings and condiments Herbs. Spices. Seasoning mixes without salt. Other foods Unsalted popcorn and pretzels. Fat-free sweets. The items listed above may not be a complete list of foods and beverages you can eat. Contact a dietitian for more information. What foods should I avoid? Fruits Canned fruit in a light or heavy syrup. Fried fruit. Fruit in cream or butter sauce. Vegetables Creamed or fried vegetables. Vegetables in a cheese sauce. Regular canned vegetables (not low-sodium or  reduced-sodium). Regular canned tomato sauce and paste (not low-sodium or reduced-sodium). Regular tomato and vegetable juice (not low-sodium or reduced-sodium). Angie Fava. Olives. Grains Baked goods made with fat, such as croissants, muffins, or some breads. Dry pasta or rice meal packs. Meats and other proteins Fatty cuts of meat. Ribs. Fried meat. Berniece Salines. Bologna, salami, and other precooked or cured meats, such as sausages or meat loaves. Fat from the back of a pig (fatback). Bratwurst. Salted nuts and seeds. Canned beans with added salt. Canned or smoked fish. Whole eggs or egg yolks. Chicken or [place] with skin. Dairy Whole or 2% milk, cream, and half-and-half. Whole or full-fat cream cheese. Whole-fat or sweetened yogurt. Full-fat cheese. Nondairy creamers. Whipped toppings. Processed cheese and cheese spreads. Fats and oils Butter. Stick margarine. Lard. Shortening. Ghee. Bacon fat. Tropical oils, such as coconut, palm kernel, or palm oil. Seasonings and condiments Onion salt, garlic salt, seasoned salt, table salt, and sea salt. Worcestershire sauce. Tartar sauce. Barbecue sauce. Teriyaki sauce. Soy sauce, including reduced-sodium. Steak sauce. Canned and packaged gravies. Fish sauce. Oyster sauce. Cocktail sauce. Store-bought horseradish. Ketchup. Mustard. Meat flavorings and tenderizers. Bouillon cubes. Hot sauces. Pre-made or packaged marinades. Pre-made or packaged taco seasonings. Relishes. Regular salad  dressings. Other foods Salted popcorn and pretzels. The items listed above may not be a complete list of foods and beverages you should avoid. Contact a dietitian for more information. Where to find more information  National Heart, Lung, and Blood Institute: https://wilson-eaton.com/  American Heart Association: www.heart.org  Academy of Nutrition and Dietetics: www.eatright.[place]: www.kidney.org Summary  The DASH eating plan is a healthy eating plan that has  been shown to reduce high blood pressure (hypertension). It may also reduce your risk for type 2 diabetes, heart disease, and stroke.  When on the DASH eating plan, aim to eat more fresh fruits and vegetables, whole grains, lean proteins, low-fat dairy, and heart-healthy fats.  With the DASH eating plan, you should limit salt (sodium) intake to 2,300 mg a day. If you have hypertension, you may need to reduce your sodium intake to 1,500 mg a day.  Work with your health care provider or dietitian to adjust your eating plan to your individual calorie needs. This information is not intended to replace advice given to you by your health care provider. Make sure you discuss any questions you have with your health care provider. Document Revised: 04/23/2019 Document Reviewed: 04/23/2019 Elsevier Patient Education  2021 [employer].

## 2020-09-12 NOTE — Progress Notes (Signed)
Phone [phone] In person visit   Subjective:   Olivia Bender is a 70 y.o. year old very pleasant female patient who presents for/with See problem oriented charting Chief Complaint  Patient presents with  . Hypothyroidism  . Hypertension  . Hyperlipidemia  . Hyperglycemia   This visit occurred during the SARS-CoV-2 public health emergency.  Safety protocols were in place, including screening questions prior to the visit, additional usage of staff PPE, and extensive cleaning of exam room while observing appropriate contact time as indicated for disinfecting solutions.   Past Medical History-  Patient Active Problem List   Diagnosis Date Noted  . Essential hypertension 12/10/2019    Priority: Medium  . Major depression in full remission ([place]) 05/18/2018    Priority: Medium  . Asthma 11/21/2017    Priority: Medium  . Generalized headaches     Priority: Medium  . Fatty infiltration of liver 12/30/2014    Priority: Medium  . Prediabetes 06/30/2014    Priority: Medium  . Hypothyroidism 12/01/2010    Priority: Medium  . Hyperlipidemia 12/01/2010    Priority: Medium  . Osteopenia 12/01/2010    Priority: Medium  . GE reflux 12/01/2010    Priority: Medium  . Trochanteric bursitis of left hip 12/11/2015    Priority: Low  . Statin intolerance 06/30/2014    Priority: Low  . Left knee pain 06/30/2014    Priority: Low  . Allergic rhinitis 12/01/2010    Priority: Low  . Pulmonary nodule/lesion, solitary 02/21/2020  . Aortic atherosclerosis ([place]) 12/11/2018    Medications- reviewed and updated Current Outpatient Medications  Medication Sig Dispense Refill  . albuterol (VENTOLIN HFA) 108 (90 Base) MCG/ACT inhaler     . aspirin 81 MG EC tablet Take 81 mg by mouth daily.    . Azelastine HCl 137 MCG/SPRAY SOLN Place into the nose.    . cyclobenzaprine (FLEXERIL) 10 MG tablet Take 10 mg by mouth 3 (three) times daily as needed.    Marland Kitchen levocetirizine (XYZAL) 5 MG tablet Take 5  mg by mouth every evening. Prn only    . levothyroxine (SYNTHROID) 75 MCG tablet TAKE 1 TABLET BY MOUTH EVERY DAY 90 tablet 3  . omeprazole (PRILOSEC OTC) 20 MG tablet Take 20 mg by mouth daily.    . sertraline (ZOLOFT) 50 MG tablet TAKE 1 TABLET BY MOUTH EVERY DAY 90 tablet 3  . triamcinolone (NASACORT) 55 MCG/ACT AERO nasal inhaler Place 2 sprays into the nose daily.    . Alirocumab (PRALUENT) 150 MG/ML SOAJ Inject 150 mg into the skin every 14 (fourteen) days. 2 mL 11   No current facility-administered medications for this visit.     Objective:  BP (!) 142/82   Pulse 88   Temp (!) 97.3 F (36.3 C) (Temporal)   Ht 5\' 1"  (1.549 m)   Wt 205 lb 3.2 oz (93.1 kg)   SpO2 96%   BMI 38.77 kg/m  Gen: NAD, resting comfortably CV: RRR no murmurs rubs or gallops Lungs: CTAB no crackles, wheeze, rhonchi Abdomen: soft/nontender/nondistended/normal bowel sounds.  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  Headaches are very infrequent and short-lived.  We offered MRI but patient declined last visitthat she was primarily concerned about cost. Has an old muscle relaxant which seems to help when she takes it- so likely muscle tension  related.  - one or two headaches a week . advil or tylenol helps.  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 Having some issues again  #hyperlipidemia #aortic atherosclerosis S: Medication: working on getting this - has a Radio producer for praluent  Lab Results  Component Value Date   CHOL  171 12/10/2019   HDL 41 (L) 12/10/2019   LDLCALC 96 12/10/2019   LDLDIRECT 184.0 11/21/2017   TRIG 227 (H) 12/10/2019   CHOLHDL 4.2 12/10/2019   A/P: hoping improved next visit when on praluent- will wait to check lipids at that time. Start on praluent when able. Getting lipids down should help aortic atherosclerosis as well  #hypothyroidism S: compliant On thyroid medication- Levothyroxine 40mcg Lab Results  Component Value Date   TSH 1.58 12/10/2019   A/P:With continued weight gain believe we need to check TSH again-adjust medicine only if TSH is out of range  #hypertension S: medication: None-was a new diagnosis with weight gain in July 2021 up 30 pounds.  Unfortunately she has not been able to lose this weight but on the other hand thankfully blood pressure has not increased  Home readings #s: Has not checked recently BP Readings from Last 3 Encounters:  09/13/20 (!) 142/82  03/23/20 110/70  03/14/20 118/90  A/P: Blood pressure mildly elevated in office today-she has not checked at home recently-she agrees to do some home checks and if above 135/85 she will let us know-we may need to consider low-dose medication if above goal at home -discussed dash eating plan as well  #hyperlipidemia/statin myalgia -Coronary artery disease was noted on CT chest 07/25/2020 S: Medication: aspirin 81 mg, Poorly controlled with no statin-has failed Crestor twice a week with co-Q10, Zetia, Lipitor, Livalo, Crestor once a week.  Did well on Repatha but has not been affordable Lab Results  Component Value Date   CHOL 171 12/10/2019   HDL 41 (L) 12/10/2019   LDLCALC 96 12/10/2019   LDLDIRECT 184.0 11/21/2017   TRIG 227 (H) 12/10/2019   CHOLHDL 4.2 12/10/2019   A/P: CAD on imaging- no chest pain or shortness of breath. Occasional palpitations after having covid- improves with rest. Cad largely asymptomatic.  Continue aspirin and glad she will be restarting Praluent-we will recheck lipids if has not  had with cardiology prior to next visit   #Elevated CBGs-A1c's have not been elevated-check once a year-not yet due  #Pulmonary nodule May 2021 with 27-month follow-up planned-was eventually completed July 25, 2020 and was stable over 2 years and thought likely benign-no further follow-up was recommended  # Depression S: Medication: sertraline 50 mg, has some stress worrying about her husbands health  Depression screen [facility] 2/9 09/13/2020 03/14/2020 01/21/2020  Decreased Interest 2 0 0  Down, Depressed, Hopeless 1 0 0  PHQ - 2 Score 3 0 0  Altered sleeping 3 0 -  Tired, decreased energy 3 0 -  Change in appetite 3 0 -  Feeling bad or failure about yourself  0 0 -  Trouble concentrating 0 0 -  Moving slowly or fidgety/restless 0 0 -  Suicidal thoughts 0 0 -  PHQ-9 Score  12 0 -  Difficult doing work/chores Not difficult at all - -  A/P: Mild poor control today-she would like to see how her husband does first before making any medication changes-we will check back in next visit and consider 100 mg dose if needed.  She will certainly let us know if has worsening symptoms prior to visit or if has any thoughts of self-harm. She thinks her weight gain has contributed  Recommended follow up: Return in about 6 months (around 03/15/2021) for follow up- or sooner if needed. Future Appointments  Date Time Provider [place]  09/26/2020  9:00 AM [facility] CCM PHARMACIST [facility]  01/26/2021 11:45 AM [facility] HEALTH COACH [facility]   Lab/Order associations:   ICD-10-CM   1. Essential hypertension  I10 CBC with Differential/Platelet    Comprehensive metabolic panel  2. Hypothyroidism, unspecified type  E03.9 TSH  3. Hyperlipidemia, unspecified hyperlipidemia type  E78.5 CBC with Differential/Platelet    Comprehensive metabolic panel  4. Major depressive disorder with single episode, in partial remission ([place])  F32.4     No orders of the defined types were placed in this  encounter.   Return precautions advised.  Garret Reddish, MD

## 2020-09-13 ENCOUNTER — Ambulatory Visit (INDEPENDENT_AMBULATORY_CARE_PROVIDER_SITE_OTHER): Payer: Medicare Other | Admitting: Family Medicine

## 2020-09-13 ENCOUNTER — Other Ambulatory Visit: Payer: Self-pay

## 2020-09-13 ENCOUNTER — Encounter: Payer: Self-pay | Admitting: Family Medicine

## 2020-09-13 VITALS — BP 142/82 | HR 88 | Temp 97.3°F | Ht 61.0 in | Wt 205.2 lb

## 2020-09-13 DIAGNOSIS — I700 Atherosclerosis of aorta: Secondary | ICD-10-CM | POA: Diagnosis not present

## 2020-09-13 DIAGNOSIS — I10 Essential (primary) hypertension: Secondary | ICD-10-CM

## 2020-09-13 DIAGNOSIS — E039 Hypothyroidism, unspecified: Secondary | ICD-10-CM | POA: Diagnosis not present

## 2020-09-13 DIAGNOSIS — E785 Hyperlipidemia, unspecified: Secondary | ICD-10-CM | POA: Diagnosis not present

## 2020-09-13 DIAGNOSIS — J4520 Mild intermittent asthma, uncomplicated: Secondary | ICD-10-CM

## 2020-09-13 DIAGNOSIS — F324 Major depressive disorder, single episode, in partial remission: Secondary | ICD-10-CM | POA: Diagnosis not present

## 2020-09-13 LAB — CBC WITH DIFFERENTIAL/PLATELET
Basophils Absolute: 0 10*3/uL (ref 0.0–0.1)
Basophils Relative: 0.6 % (ref 0.0–3.0)
Eosinophils Absolute: 0.2 10*3/uL (ref 0.0–0.7)
Eosinophils Relative: 2.8 % (ref 0.0–5.0)
HCT: 35.3 % — ABNORMAL LOW (ref 36.0–46.0)
Hemoglobin: 11.8 g/dL — ABNORMAL LOW (ref 12.0–15.0)
Lymphocytes Relative: 29.1 % (ref 12.0–46.0)
Lymphs Abs: 1.6 10*3/uL (ref 0.7–4.0)
MCHC: 33.5 g/dL (ref 30.0–36.0)
MCV: 93.1 fl (ref 78.0–100.0)
Monocytes Absolute: 0.6 10*3/uL (ref 0.1–1.0)
Monocytes Relative: 10.7 % (ref 3.0–12.0)
Neutro Abs: 3.2 10*3/uL (ref 1.4–7.7)
Neutrophils Relative %: 56.8 % (ref 43.0–77.0)
Platelets: 227 10*3/uL (ref 150.0–400.0)
RBC: 3.79 Mil/uL — ABNORMAL LOW (ref 3.87–5.11)
RDW: 14.1 % (ref 11.5–15.5)
WBC: 5.6 10*3/uL (ref 4.0–10.5)

## 2020-09-13 LAB — COMPREHENSIVE METABOLIC PANEL
ALT: 23 U/L (ref 0–35)
AST: 26 U/L (ref 0–37)
Albumin: 3.8 g/dL (ref 3.5–5.2)
Alkaline Phosphatase: 58 U/L (ref 39–117)
BUN: 20 mg/dL (ref 6–23)
CO2: 28 mEq/L (ref 19–32)
Calcium: 8.9 mg/dL (ref 8.4–10.5)
Chloride: 106 mEq/L (ref 96–112)
Creatinine, Ser: 0.8 mg/dL (ref 0.40–1.20)
GFR: 74.69 mL/min (ref >60.00)
Glucose, Bld: 90 mg/dL (ref 70–99)
Potassium: 4.1 mEq/L (ref 3.5–5.1)
Sodium: 140 mEq/L (ref 135–145)
Total Bilirubin: 0.4 mg/dL (ref 0.2–1.2)
Total Protein: 6.8 g/dL (ref 6.0–8.3)

## 2020-09-13 LAB — TSH: TSH: 1.64 u[IU]/mL (ref 0.35–4.50)

## 2020-09-18 ENCOUNTER — Telehealth: Payer: Medicare Other

## 2020-09-18 ENCOUNTER — Other Ambulatory Visit: Payer: Self-pay | Admitting: Family Medicine

## 2020-09-26 ENCOUNTER — Ambulatory Visit (INDEPENDENT_AMBULATORY_CARE_PROVIDER_SITE_OTHER): Payer: Medicare Other

## 2020-09-26 DIAGNOSIS — E039 Hypothyroidism, unspecified: Secondary | ICD-10-CM | POA: Diagnosis not present

## 2020-09-26 DIAGNOSIS — J4520 Mild intermittent asthma, uncomplicated: Secondary | ICD-10-CM | POA: Diagnosis not present

## 2020-09-26 DIAGNOSIS — K219 Gastro-esophageal reflux disease without esophagitis: Secondary | ICD-10-CM

## 2020-09-26 DIAGNOSIS — I10 Essential (primary) hypertension: Secondary | ICD-10-CM | POA: Diagnosis not present

## 2020-09-26 DIAGNOSIS — Z789 Other specified health status: Secondary | ICD-10-CM

## 2020-09-26 NOTE — Patient Instructions (Addendum)
[name],  Thank you for talking with me today. I have included our care plan/goals in the following pages.   Please review and call me at [phone] with any questions.  Thanks! Ellin Mayhew, Pharm.D., BCGP Clinical Pharmacist [facility] [place] [phone] Patient Care Plan: Olivia Bender Plan    Problem Identified: Asthma, HTN, Aortic Atherosclerosis, Osteopenia, GERD, Hypothyroidism, MDD, Statin intolerance, Fatty liver   Priority: High    Long-Range Goal: Disease Management   Start Date: 09/26/2020  Expected End Date: 09/26/2021  This Visit's Progress: On track  Priority: High  Note:   Current Barriers:  . Ongoing GERD Symptoms, needing to come up with excercise goals  Pharmacist Clinical Goal(s):  Marland Kitchen Patient will contact provider office for questions/concerns as evidenced notation of same in electronic health record through collaboration with PharmD and provider.   Interventions: . 1:1 collaboration with Marin Olp, MD regarding development and update of comprehensive plan of care as evidenced by provider attestation and co-signature . Inter-disciplinary care team collaboration (see longitudinal plan of care) . Comprehensive medication review performed; medication list updated in electronic medical record . No med changes at this time  Hypertension (BP goal <130/80) -Controlled based on last three OV BP readings  -Current treatment: . No current medications -Medications previously tried: no medications, some previous weight loss seemed to have improved BP management.  -Current home readings: none provided. Does have BP cuff. -Current dietary patterns: trying not to snack - cutting down on sweets. Denies routine intake of fruits/vegetables. Current exercise habits: trying to walk but hip had hurt so had quit and did not revisit.   -Denies hypotensive/hypertensive symptoms -Counseled to monitor BP at home  1-2x every 2 weeks, document, and provide log at future appointments -Recommended to continue current medication  Depression/Anxiety (Goal: minimize symptoms) -Controlled  -Feels sertraline has helped her a lot, did have concern over husbands health for a while. Feels there are some symptoms of depression but is not wanting to further adjust medication or seek additional support. -Current treatment: . Sertraline 50 mg once daily  -PHQ9: not performed today, last 12 on 09/13/2020 -Educated on Benefits of medication for symptom control -Recommended to continue current medication  Hyperlipidemia: (LDL goal < 70) -Likely controlled, is needed updated labs to further assess. Reports to have just given first Praluent injection last week. No cost concerns due to receiving assistance through [employer].  -HTN, Aortic Atherosclerosis  -Statin intolerance -Current treatment: . Praluent 150 mg once every 14 days (08/29/2020 - Dr Debara Pickett) -Educated on Cholesterol goals;  -Recommended to continue current medication  -Plan to walk 2-3x/week  Asthma (Goal: control symptoms and prevent exacerbations) -Controlled -Current treatment  . Albuterol 108 mcg/act PRN -Exacerbations requiring treatment in last 6 months: 0 -Patient denies consistent use of maintenance inhaler. Not needed.  -Frequency of rescue inhaler use: has not used  -Counseled on When to use rescue inhaler -Recommended to continue current medication  Hypothyroidism (Goal: maintain normal TSH) -Controlled  -Admits to missed dose once every 1-2 weeks - will start focusing on taking first thing in AM consistently  -Current treatment  . Levothyroxine 75 mcg once daily -Recommended to continue current medication  GERD (minimize symptoms) -Not ideally controlled -Symptoms 1-2x/week. Large amounts of coffee and does have a glass of lemonade once daily. Has given up on omeprazole due to ongoing symptoms and just uses  tums. -Regimen:  Omeprazole 20 mg once  daily OTC (held) Reviewed reflux triggers at length Will check on dietary progress next month  Patient Goals/Self-Care Activities . Patient will:  - take medications as prescribed target a minimum of 150 minutes of moderate intensity exercise weekly engage in dietary modifications by reducing coffee and lemonade intake. goal exercise is 2-3x/week  Follow Up Plan: 1 month pt call. 3 month rph f/u telephone visit Medication Assistance: None required.  Patient affirms current coverage meets needs.  Patient's preferred pharmacy is:  [id number] - [place], [place] [address] Phone: [phone] Fax: [phone]  Follow Up:  Patient agrees to Care Plan and Follow-up.      The patient was given the following information about Chronic Care Management services today, agreed to services, and gave verbal consent: 1. CCM service includes personalized support from designated clinical staff supervised by the primary care provider, including individualized plan of care and coordination with other care providers 2. 24/7 contact phone numbers for assistance for urgent and routine care needs. 3. Service will only be billed when office clinical staff spend 20 minutes or more in a month to coordinate care. 4. Only one practitioner may furnish and bill the service in a calendar month. 5.The patient may stop CCM services at any time (effective at the end of the month) by phone call to the office staff. 6. The patient will be responsible for cost sharing (co-pay) of up to 20% of the service fee (after annual deductible is met). Patient agreed to services and consent obtained.  The patient verbalized understanding of instructions provided today and agreed to receive a MyChart copy of patient instruction and/or educational materials. Telephone follow up appointment with pharmacy team member scheduled for: See  next appointment with "Care Management Staff" under "What's Next" below.  Food Choices for Gastroesophageal Reflux Disease, Adult When you have gastroesophageal reflux disease (GERD), the foods you eat and your eating habits are very important. Choosing the right foods can help ease your discomfort. Think about working with a food expert (dietitian) to help you make good choices. What are tips for following this plan? Reading food labels  Look for foods that are low in saturated fat. Foods that may help with your symptoms include: ? Foods that have less than 5% of daily value (DV) of fat. ? Foods that have 0 grams of trans fat. Cooking  Do not fry your food.  Cook your food by baking, steaming, grilling, or broiling. These are all methods that do not need a lot of fat for cooking.  To add flavor, try to use herbs that are low in spice and acidity. Meal planning  Choose healthy foods that are low in fat, such as: ? Fruits and vegetables. ? Whole grains. ? Low-fat dairy products. ? Lean meats, fish, and poultry.  Eat small meals often instead of eating 3 large meals each day. Eat your meals slowly in a place where you are relaxed. Avoid bending over or lying down until 2-3 hours after eating.  Limit high-fat foods such as fatty meats or fried foods.  Limit your intake of fatty foods, such as oils, butter, and shortening.  Avoid the following as told by your doctor: ? Foods that cause symptoms. These may be different for different people. Keep a food diary to keep track of foods that cause symptoms. ? Alcohol. ? Drinking a lot of liquid with meals. ? Eating meals during the 2-3 hours before bed.  Lifestyle  Stay at a healthy weight. Ask your doctor what weight is healthy for you. If you need to lose weight, work with your doctor to do so safely.  Exercise for at least 30 minutes on 5 or more days each week, or as told by your doctor.  Wear loose-fitting clothes.  Do not smoke  or use any products that contain nicotine or tobacco. If you need help quitting, ask your doctor.  Sleep with the head of your bed higher than your feet. Use a wedge under the mattress or blocks under the bed frame to raise the head of the bed.  Chew sugar-free gum after meals. What foods should eat? Eat a healthy, well-balanced diet of fruits, vegetables, whole grains, low-fat dairy products, lean meats, fish, and poultry. Each person is different. Foods that may cause symptoms in one person may not cause any symptoms in another person. Work with your doctor to find foods that are safe for you. The items listed above may not be a complete list of what you can eat and drink. Contact a food expert for more options.   What foods should I avoid? Limiting some of these foods may help in managing the symptoms of GERD. Everyone is different. Talk with a food expert or your doctor to help you find the exact foods to avoid, if any. Fruits Any fruits prepared with added fat. Any fruits that cause symptoms. For some people, this may include citrus fruits, such as oranges, grapefruit, pineapple, and lemons. Vegetables Deep-fried vegetables. [place] fries. Any vegetables prepared with added fat. Any vegetables that cause symptoms. For some people, this may include tomatoes and tomato products, chili peppers, onions and garlic, and horseradish. Grains Pastries or quick breads with added fat. Meats and other proteins High-fat meats, such as fatty beef or pork, hot dogs, ribs, ham, sausage, salami, and bacon. Fried meat or protein, including fried fish and fried chicken. Nuts and nut butters, in large amounts. Dairy Whole milk and chocolate milk. Sour cream. Cream. Ice cream. Cream cheese. Milkshakes. Fats and oils Butter. Margarine. Shortening. Ghee. Beverages Coffee and tea, with or without caffeine. Carbonated beverages. Sodas. Energy drinks. Fruit juice made with acidic fruits, such as orange or  grapefruit. Tomato juice. Alcoholic drinks. Sweets and desserts Chocolate and cocoa. Donuts. Seasonings and condiments Pepper. Peppermint and spearmint. Added salt. Any condiments, herbs, or seasonings that cause symptoms. For some people, this may include curry, hot sauce, or vinegar-based salad dressings. The items listed above may not be a complete list of what you should not eat and drink. Contact a food expert for more options. Questions to ask your doctor Diet and lifestyle changes are often the first steps that are taken to manage symptoms of GERD. If diet and lifestyle changes do not help, talk with your doctor about taking medicines. Where to find more information  International Foundation for Gastrointestinal Disorders: aboutgerd.org Summary  When you have GERD, food and lifestyle choices are very important in easing your symptoms.  Eat small meals often instead of 3 large meals a day. Eat your meals slowly and in a place where you are relaxed.  Avoid bending over or lying down until 2-3 hours after eating.  Limit high-fat foods such as fatty meats or fried foods. This information is not intended to replace advice given to you by your health care provider. Make sure you discuss any questions you have with your health care provider. Document Revised: 11/29/2019 Document Reviewed: 11/29/2019 Elsevier Patient  Education  2021 [employer]  

## 2020-09-26 NOTE — Progress Notes (Signed)
Chronic Care Management Pharmacy Note  09/26/2020 Name:  Olivia Bender MRN:  [id number] DOB:  11-28-1950  Subjective: Olivia Bender is an 70 y.o. year old female who is a primary patient of Hunter, Brayton Mars, MD.  The CCM team was consulted for assistance with disease management and care coordination needs.    Engaged with patient by telephone for initial visit in response to provider referral for pharmacy case management and/or care coordination services.   Consent to Services:  The patient was given the following information about Chronic Care Management services today, agreed to services, and gave verbal consent: 1. CCM service includes personalized support from designated clinical staff supervised by the primary care provider, including individualized plan of care and coordination with other care providers 2. 24/7 contact phone numbers for assistance for urgent and routine care needs. 3. Service will only be billed when office clinical staff spend 20 minutes or more in a month to coordinate care. 4. Only one practitioner may furnish and bill the service in a calendar month. 5.The patient may stop CCM services at any time (effective at the end of the month) by phone call to the office staff. 6. The patient will be responsible for cost sharing (co-pay) of up to 20% of the service fee (after annual deductible is met). Patient agreed to services and consent obtained.  Patient Care Team: Marin Olp, MD as PCP - General (Family Medicine) Garrel Ridgel, DPM as Consulting Physician (Podiatry) Debara Pickett, Nadean Corwin, MD as Consulting Physician (Cardiology) Harold Hedge, Darrick Grinder, MD as Consulting Physician (Allergy and Immunology) Madelin Rear, [facility] as Pharmacist (Pharmacist)  Recent office visits: 09/13/2020 (PCP): no med changes. Is receiving steroid injections for hip pain. Planning to start praluent.   Hospital visits: None in previous 6 months  Objective:  Lab Results  Component Value  Date   CREATININE 0.80 09/13/2020   CREATININE 0.71 12/10/2019   CREATININE 0.73 05/18/2018    Lab Results  Component Value Date   HGBA1C 5.4 12/10/2019   Last diabetic Eye exam: No results found for: HMDIABEYEEXA  Last diabetic Foot exam: No results found for: HMDIABFOOTEX      Component Value Date/Time   CHOL 171 12/10/2019 1628   CHOL 168 03/09/2019 1035   TRIG 227 (H) 12/10/2019 1628   HDL 41 (L) 12/10/2019 1628   HDL 53 03/09/2019 1035   CHOLHDL 4.2 12/10/2019 1628   VLDL 21.4 05/18/2018 0936   LDLCALC 96 12/10/2019 1628   LDLDIRECT 184.0 11/21/2017 1106    Hepatic Function Latest Ref Rng & Units 09/13/2020 12/10/2019 05/18/2018  Total Protein 6.0 - 8.3 g/dL 6.8 7.3 7.0  Albumin 3.5 - 5.2 g/dL 3.8 - 4.2  AST 0 - 37 U/L _0 ALT 0 - 35 U/L _1 Alk Phosphatase 39 - 117 U/L 58 - 54  Total Bilirubin 0.2 - 1.2 mg/dL 0.4 0.3 0.5  Bilirubin, Direct 0.0 - 0.3 mg/dL - - -    Lab Results  Component Value Date/Time   TSH 1.64 09/13/2020 10:22 AM   TSH 1.58 12/10/2019 04:28 PM    CBC Latest Ref Rng & Units 09/13/2020 03/14/2020 12/10/2019  WBC 4.0 - 10.5 K/uL 5.6 8.3 6.8  Hemoglobin 12.0 - 15.0 g/dL 11.8(L) 12.4 11.4(L)  Hematocrit 36.0 - 46.0 % 35.3(L) 37.1 34.6(L)  Platelets 150.0 - 400.0 K/uL 227.0 268 257    Lab Results  Component Value Date/Time   VD25OH 43 05/22/2017  10:45 AM   VD25OH 31 12/11/2015 01:18 PM    Clinical ASCVD:  The 10-year ASCVD risk score Mikey Bussing DC Jr., et al., 2013) is: 11.6%   Values used to calculate the score:     Age: 85 years     Sex: Female     Is Non-Hispanic African American: No     Diabetic: No     Tobacco smoker: No     Systolic Blood Pressure: 242 mmHg     Is BP treated: No     HDL Cholesterol: 41 mg/dL     Total Cholesterol: 171 mg/dL    Social History   Tobacco Use  Smoking Status Never Smoker  Smokeless Tobacco Never Used   BP Readings from Last 3 Encounters:  09/13/20 (!) 142/82  03/23/20 110/70  03/14/20  118/90   Pulse Readings from Last 3 Encounters:  09/13/20 88  03/23/20 97  03/14/20 96   Wt Readings from Last 3 Encounters:  09/13/20 205 lb 3.2 oz (93.1 kg)  03/23/20 199 lb (90.3 kg)  03/14/20 196 lb 12.8 oz (89.3 kg)    Assessment: Review of patient past medical history, allergies, medications, health status, including review of consultants reports, laboratory and other test data, was performed as part of comprehensive evaluation and provision of chronic care management services.   SDOH:  (Social Determinants of Health) assessments and interventions performed: Yes   CCM Care Plan  Allergies  Allergen Reactions  . Tetanus Toxoids Anaphylaxis  . Latex Rash  . Ezetimibe Other (See Comments)    Myalgia  . Statins Other (See Comments)    Lipitor, Crestor, Livalo -myalgias    Medications Reviewed Today    Reviewed by Madelin Rear, [facility] (Pharmacist) on 09/26/20 at [phone]  Med List Status: <None>  Medication Order Taking? Sig Documenting Provider Last Dose Status Informant  albuterol (VENTOLIN HFA) 108 (90 Base) MCG/ACT inhaler [id number] Yes  [provider]  Active            Med Note Willette Brace   Fri Jan 21, 2020 11:30 AM) As needed  Alirocumab (PRALUENT) 150 MG/ML SOAJ [id number] Yes Inject 150 mg into the skin every 14 (fourteen) days. Pixie Casino, MD  Active   aspirin 81 MG EC tablet [id number]  Take 81 mg by mouth daily. [provider]  Active   Azelastine HCl 137 MCG/SPRAY SOLN [id number] Yes Place into the nose. [provider]  Active            Med Note Willette Brace   Fri Jan 21, 2020 11:30 AM) As needed   cyclobenzaprine (FLEXERIL) 10 MG tablet [id number] Yes Take 10 mg by mouth 3 (three) times daily as needed. [provider]  Active            Med Note Willette Brace   Fri Jan 21, 2020 11:31 AM) As needed  levocetirizine (XYZAL) 5 MG tablet [id number] Yes Take 5 mg by mouth every evening. Prn only [provider]  Active   levothyroxine (SYNTHROID) 75 MCG tablet [id number] Yes TAKE 1 TABLET BY MOUTH EVERY DAY Marin Olp, MD  Active   omeprazole (PRILOSEC OTC) 20 MG tablet [id number] No Take 20 mg by mouth daily.  Patient not taking: Reported on 09/26/2020   [provider] Not Taking Active   sertraline (ZOLOFT) 50 MG tablet [id number] Yes TAKE 1 TABLET BY MOUTH EVERY DAY Marin Olp, MD  Active   triamcinolone (  NASACORT) 55 MCG/ACT AERO nasal inhaler [id number] Yes Place 2 sprays into the nose daily. [provider]  Active            Med Note Willette Brace   Fri Jan 21, 2020 11:31 AM) As needed           Patient Active Problem List   Diagnosis Date Noted  . Pulmonary nodule/lesion, solitary 02/21/2020  . Essential hypertension 12/10/2019  . Aortic atherosclerosis ([place]) 12/11/2018  . Major depression in full remission ([place]) 05/18/2018  . Asthma 11/21/2017  . Generalized headaches   . Trochanteric bursitis of left hip 12/11/2015  . Fatty infiltration of liver 12/30/2014  . Statin intolerance 06/30/2014  . Left knee pain 06/30/2014  . Prediabetes 06/30/2014  . Hypothyroidism 12/01/2010  . Hyperlipidemia 12/01/2010  . Osteopenia 12/01/2010  . Allergic rhinitis 12/01/2010  . GE reflux 12/01/2010    Immunization History  Administered Date(s) Administered  . Fluad Quad(high Dose 65+) 03/14/2020  . Influenza, High Dose Seasonal PF 04/20/2018, 03/04/2019  . Influenza,inj,Quad PF,6+ Mos 05/22/2017  . Influenza-Unspecified 04/17/2014  . PFIZER(Purple Top)SARS-COV-2 Vaccination 12/17/2019, 01/06/2020, 06/08/2020  . Pneumococcal Conjugate-13 07/28/2015  . Pneumococcal Polysaccharide-23 07/14/2005, 11/21/2017  . Zoster 12/23/2011  . Zoster Recombinat (Shingrix) 04/20/2019, 09/15/2019    Conditions to be addressed/monitored: Asthma, HTN, Aortic Atherosclerosis, Osteopenia, GERD, Hypothyroidism, MDD, Statin intolerance, Fatty liver  Care Plan :  [place]  Updates made by Madelin Rear, [facility] since 09/26/2020 12:00 AM    Problem: Asthma, HTN, Aortic Atherosclerosis, Osteopenia, GERD, Hypothyroidism, MDD, Statin intolerance, Fatty liver   Priority: High    Long-Range Goal: Disease Management   Start Date: 09/26/2020  Expected End Date: 09/26/2021  This Visit's Progress: On track  Priority: High  Note:   Current Barriers:  . Ongoing GERD Symptoms, needing to come up with excercise goals  Pharmacist Clinical Goal(s):  Marland Kitchen Patient will contact provider office for questions/concerns as evidenced notation of same in electronic health record through collaboration with PharmD and provider.   Interventions: . 1:1 collaboration with Marin Olp, MD regarding development and update of comprehensive plan of care as evidenced by provider attestation and co-signature . Inter-disciplinary care team collaboration (see longitudinal plan of care) . Comprehensive medication review performed; medication list updated in electronic medical record . No med changes at this time  Hypertension (BP goal <130/80) -Controlled based on last three OV BP readings  -Current treatment: . No current medications -Medications previously tried: no medications, some previous weight loss seemed to have improved BP management.  -Current home readings: none provided. Does have BP cuff. -Current dietary patterns: trying not to snack - cutting down on sweets. Denies routine intake of fruits/vegetables. Current exercise habits: trying to walk but hip had hurt so had quit and did not revisit.   -Denies hypotensive/hypertensive symptoms -Counseled to monitor BP at home 1-2x every 2 weeks, document, and provide log at future appointments -Recommended to continue current medication  Depression/Anxiety (Goal: minimize symptoms) -Controlled  -Feels sertraline has helped her a lot, did have concern over husbands health for a while. Feels there are some symptoms  of depression but is not wanting to further adjust medication or seek additional support. -Current treatment: . Sertraline 50 mg once daily  -PHQ9: not performed today, last 12 on 09/13/2020 -Educated on Benefits of medication for symptom control -Recommended to continue current medication  Hyperlipidemia: (LDL goal < 70) -Likely controlled, is needed updated labs to further assess. Reports to  have just given first Praluent injection last week. No cost concerns due to receiving assistance through [employer].  -HTN, Aortic Atherosclerosis  -Statin intolerance -Current treatment: . Praluent 150 mg once every 14 days (08/29/2020 - Dr Debara Pickett) -Educated on Cholesterol goals;  -Recommended to continue current medication  -Plan to walk 2-3x/week  Asthma (Goal: control symptoms and prevent exacerbations) -Controlled -Current treatment  . Albuterol 108 mcg/act PRN -Exacerbations requiring treatment in last 6 months: 0 -Patient denies consistent use of maintenance inhaler. Not needed.  -Frequency of rescue inhaler use: has not used  -Counseled on When to use rescue inhaler -Recommended to continue current medication  Hypothyroidism (Goal: maintain normal TSH) -Controlled  -Admits to missed dose once every 1-2 weeks - will start focusing on taking first thing in AM consistently  -Current treatment  . Levothyroxine 75 mcg once daily -Recommended to continue current medication  GERD (minimize symptoms) -Not ideally controlled -Symptoms 1-2x/week. Large amounts of coffee and does have a glass of lemonade once daily. Has given up on omeprazole due to ongoing symptoms and just uses tums. -Regimen:  Omeprazole 20 mg once daily OTC (held) Reviewed reflux triggers at length Will check on dietary progress next month  Patient Goals/Self-Care Activities . Patient will:  - take medications as prescribed target a minimum of 150 minutes of moderate intensity exercise weekly engage in dietary  modifications by reducing coffee and lemonade intake. goal exercise is 2-3x/week  Follow Up Plan: 1 month pt call. 3 month rph f/u telephone visit Medication Assistance: None required.  Patient affirms current coverage meets needs.  Patient's preferred pharmacy is:  [id number] [id number]: [phone]: [phone] Follow Up:  Patient agrees to Care Plan and Follow-up.     Future Appointments  Date Time Provider [place] 12/26/2020 10:30 AM [facility] CCM PHARMACIST [facility]  01/26/2021 11:45 AM [facility] HEALTH COACH [facility]  03/19/2021  9:40 AM HYong Channel SBrayton Mars MD [facility]   JMadelin Rear Pharm.D., BCGP Clinical Pharmacist [place]([phone]

## 2020-10-23 ENCOUNTER — Telehealth: Payer: Self-pay

## 2020-10-23 NOTE — Chronic Care Management (AMB) (Signed)
    Chronic Care Management Pharmacy Assistant   Name: ELESA GARMAN  MRN: [id number] DOB: [birth year]  Reason for Encounter: General Phone Call   Recent office visits:  No visits noted  Recent consult visits:  No visits noted  Hospital visits:  None in previous 6 months  Medications: Outpatient Encounter Medications as of 10/23/2020  Medication Sig Note  . albuterol (VENTOLIN HFA) 108 (90 Base) MCG/ACT inhaler  01/21/2020: As needed  . Alirocumab (PRALUENT) 150 MG/ML SOAJ Inject 150 mg into the skin every 14 (fourteen) days.   Marland Kitchen aspirin 81 MG EC tablet Take 81 mg by mouth daily.   . Azelastine HCl 137 MCG/SPRAY SOLN Place into the nose. 01/21/2020: As needed   . cyclobenzaprine (FLEXERIL) 10 MG tablet Take 10 mg by mouth 3 (three) times daily as needed. 01/21/2020: As needed  . levocetirizine (XYZAL) 5 MG tablet Take 5 mg by mouth every evening. Prn only   . levothyroxine (SYNTHROID) 75 MCG tablet TAKE 1 TABLET BY MOUTH EVERY DAY   . omeprazole (PRILOSEC OTC) 20 MG tablet Take 20 mg by mouth daily. (Patient not taking: Reported on 09/26/2020)   . sertraline (ZOLOFT) 50 MG tablet TAKE 1 TABLET BY MOUTH EVERY DAY   . triamcinolone (NASACORT) 55 MCG/ACT AERO nasal inhaler Place 2 sprays into the nose daily. 01/21/2020: As needed    No facility-administered encounter medications on file as of 10/23/2020.   I spoke with Ms. Jetter this morning and she has just gotten back from vacationing at [place]. She had a wonderful time and also enjoyed a scenic drive there. She has not been exercising since she  hasn't been home to use her equipment.  She stated that she has decreased her lemonade intake to 1-2 glasses per week to help with her acid reflux. On the other hand she drinks about 5 cups of half caff daily. Ms. Burgner has agreed to start incorporating exercising and limiting her coffee intake as it was previously recommended. She is doing well otherwise and has no concerns.  Star Rating  Drugs: No Star Ratings Drug   [facility] CPA, CMA

## 2020-11-07 NOTE — Progress Notes (Signed)
    Subjective:    CC: R hand pain  I, Molly Weber, LAT, ATC, am serving as scribe for Dr. Lynne Leader.  HPI: Pt is a 70 y/o female presenting w/ R hand pain x 1 month.  She locates her pain to dorsal aspect of wrist and along hypothenar eminence/pisiform. Pt notes she fell last year and bumped her hand.  Grip strength: normal Numbness/tingling: sometimes- whole hand R hand swelling: yes- along wrist Aggravating factors: push up from a chair Treatments tried: Tylenol   Pertinent review of Systems: No fevers or chills  Relevant historical information: Hypertension Plan[place] fasciitis and trochanteric bursitis.   Objective:    Vitals:   11/08/20 0945  BP: (!) 172/102  Pulse: 82  SpO2: 97%   General: Well Developed, well nourished, and in no acute distress.   MSK: Right wrist subtle nodule dorsal radial wrist.  Minimally tender palpation. Normal wrist motion. Mildly tender palpation Pisa form. Intact strength. Pulses cap refill and sensation are intact distally.    Lab and Radiology Results  Diagnostic Limited MSK Ultrasound of: Right wrist Radial wrist visualized.  No ganglion cyst present.  Nodule is bossing of the proximal second metacarpal. Dorsal tendons normal-appearing Tender area is Pisiform at insertion of flexor carpi ulnaris tendon.  Normal-appearing on ultrasound examination. Impression: Carpal bossing.  Flexor carpi ulnaris tendinitis  X-ray images right wrist obtained today personally and independently interpreted No acute fractures.  Mild degenerative changes. Await formal radiology review  Impression and Recommendations:    Assessment and Plan: 70 y.o. female with right wrist pain multifactorial.  Patient does not have a ganglion cyst per my interpretation today.  The main area of dorsal nodule visible on exam appears to be metacarpal bossing.  She also has some apparent flexor carpi ulnaris tendinitis.  Plan for home exercise program and Voltaren  gel.  Consider hand physical therapy.  Patient lives in the [place] area would is not very near any hand physical therapy.  Recheck in 6 weeks.  If not improved would recommend direct referral to hand physical therapy and even injection.Marland Kitchen  PDMP not reviewed this encounter. Orders Placed This Encounter  Procedures  . [place] LIMITED JOINT SPACE STRUCTURES UP RIGHT(NO LINKED CHARGES)    Standing Status:   Future    Number of Occurrences:   1    Standing Expiration Date:   05/10/2021    Order Specific Question:   Reason for Exam (SYMPTOM  OR DIAGNOSIS REQUIRED)    Answer:   right hand pain    Order Specific Question:   Preferred imaging location?    Answer:   [place]  . DG Wrist Complete Right    Standing Status:   Future    Number of Occurrences:   1    Standing Expiration Date:   11/08/2021    Order Specific Question:   Reason for Exam (SYMPTOM  OR DIAGNOSIS REQUIRED)    Answer:   eval right wrist pain    Order Specific Question:   Preferred imaging location?    Answer:   [facility]   No orders of the defined types were placed in this encounter.   Discussed warning signs or symptoms. Please see discharge instructions. Patient expresses understanding.   The above documentation has been reviewed and is accurate and complete Lynne Leader, M.D.

## 2020-11-08 ENCOUNTER — Ambulatory Visit: Payer: Self-pay

## 2020-11-08 ENCOUNTER — Ambulatory Visit (INDEPENDENT_AMBULATORY_CARE_PROVIDER_SITE_OTHER): Payer: Medicare Other

## 2020-11-08 ENCOUNTER — Telehealth: Payer: Self-pay

## 2020-11-08 ENCOUNTER — Ambulatory Visit (INDEPENDENT_AMBULATORY_CARE_PROVIDER_SITE_OTHER): Payer: Medicare Other | Admitting: Family Medicine

## 2020-11-08 ENCOUNTER — Encounter: Payer: Self-pay | Admitting: Family Medicine

## 2020-11-08 ENCOUNTER — Other Ambulatory Visit: Payer: Self-pay

## 2020-11-08 VITALS — BP 172/102 | HR 82 | Ht 61.0 in | Wt 207.2 lb

## 2020-11-08 DIAGNOSIS — M25531 Pain in right wrist: Secondary | ICD-10-CM | POA: Diagnosis not present

## 2020-11-08 DIAGNOSIS — M79641 Pain in right hand: Secondary | ICD-10-CM | POA: Diagnosis not present

## 2020-11-08 NOTE — Patient Instructions (Addendum)
Thank you for coming in today.  Please get an Xray today before you leave  Try the home exercises your self. Please complete the exercises that the athletic trainer went over with you: View at [email or web address] using code: [id number]  Please use Voltaren gel (Generic Diclofenac Gel) up to 4x daily for pain as needed.  This is available over-the-counter as both the name brand Voltaren gel and the generic diclofenac gel.  Recheck in 6 weeks.   If not better we can do more including hand PT and even injections or fancier imaging tests.    Flexor Carpi Ulnaris and Medtronic Radialis Tendinitis Rehab Ask your health care provider which exercises are safe for you. Do exercises exactly as told by your health care provider and adjust them as directed. It is normal to feel mild stretching, pulling, tightness, or discomfort as you do these exercises. Stop right away if you feel sudden pain or your pain gets worse. Do not begin these exercises until told by your health care provider. Range-of-motion exercises These exercises warm up your muscles and joints and improve the movement and flexibility of your forearm. These exercises also help to relieve pain, numbness, and tingling. They are done using the muscles in your injured forearm. Wrist flexion 1. Sit or stand with your left / right elbow bent to a 90-degree angle (right angle) at your side and your palm facing the floor. 2. Slowly bend your wrist so that your fingers move toward the floor (flexion), stopping when you feel a gentle stretch over the back of your forearm. 3. Hold this position for __________ seconds. 4. Slowly return to the starting position. Repeat __________ times. Complete this exercise __________ times a day. Wrist extension 1. Sit or stand with your left / right elbow bent to a 90-degree angle (right angle) at your side and your palm facing the floor. 2. Slowly lift your wrist up so that your fingers move toward the ceiling  (extension), stopping when you feel a gentle stretch on the palm side of your forearm. 3. Hold this position for __________ seconds. 4. Slowly return to the starting position. Repeat __________ times. Complete this exercise __________ times a day. Forearm rotation, supination 1. Sit or stand with your left / right elbow bent to a 90-degree angle (right angle) at your side. Position your forearm so that the thumb is facing the ceiling (neutral position). 2. Turn (rotate) your palm up toward the ceiling (supination) until you feel a gentle stretch on the inside of your forearm. 3. Hold this position for __________ seconds. 4. Slowly return your palm to the starting position. Repeat __________ times. Complete this exercise __________ times a day. Forearm rotation, pronation 1. Sit or stand with your left / right elbow bent to a 90-degree angle (right angle) at your side. Position your forearm so that the thumb is facing the ceiling (neutral position). 2. Turn (rotate) your palm down toward the floor (pronation) until you feel a gentle stretch on the top of your forearm. 3. Hold this position for __________ seconds. 4. Slowly return your palm to the starting position. Repeat __________ times. Complete this exercise __________ times a day. Stretching exercises These exercises warm up your muscles and joints and improve the movement and flexibility of your forearm. These exercises also help to relieve pain, numbness, and tingling. They are done using your healthy forearm to help stretch the muscles in your injured forearm. Wrist flexion, assisted 1. Sit or stand and extend your left /  right arm in front of you. Turn your palm down toward the floor and relax your wrist. 2. With your other hand (assisted), gently push on the back of your hand to bend your wrist and fingers toward the floor (flexion). Stop when you feel a gentle stretch on the top of your forearm. 3. Hold this position for __________  seconds. 4. Slowly return to the starting position. Repeat __________ times. Complete this exercise __________ times a day.   Wrist extension, assisted 1. Sit or stand and extend your left / right arm in front of you. Turn your palm up toward the ceiling and relax your wrist. 2. With your other hand (assisted), gently pull your palm and fingertips back so your fingers point down toward the floor (extension). You should feel a gentle stretch on the palm-side of your forearm. 3. Hold this position for __________ seconds. 4. Slowly return to the starting position. Repeat __________ times. Complete this exercise __________ times a day.   Forearm rotation, supination 1. Sit with your left / right elbow bent to a 90-degree angle (right angle) with your forearm resting on a table. 2. Keeping your upper body and shoulder still, use your other hand to turn (rotate) your forearm palm-up (supination) until you feel a gentle to moderate stretch. 3. Hold this position for __________ seconds. 4. Slowly release the stretch, and return to the starting position. Repeat __________ times. Complete this exercise __________ times a day. Forearm rotation, pronation 1. Sit with your left / right elbow bent to a 90-degree angle (right angle) with your forearm resting on a table. 2. Keeping your upper body and shoulder still, use your other hand to turn (rotate) your forearm palm down (pronation) until you feel a gentle to moderate stretch. 3. Hold this position for __________ seconds. 4. Slowly release the stretch, and return to the starting position. Repeat __________ times. Complete this exercise __________ times a day. Strengthening exercises These exercises build strength and endurance in your wrist and forearm. Endurance is the ability to use your muscles for a long time, even after they get tired. Wrist flexion 1. Sit with your left / right forearm supported on a table and your hand resting palm-up over the  edge of the table. Your elbow should be bent to a 90-degree angle (right angle) and rest below the level of your shoulder. 2. Hold a __________ weight in your hand. Or, hold a rubber exercise band or tube in both hands, keeping your hands at the same level and hip distance apart. There should be a slight tension in the exercise band or tube. 3. Slowly lift your hand up toward the ceiling (flexion). 4. Hold this position for __________ seconds. 5. Slowly lower your hand back to the starting position. Repeat __________ times. Complete this exercise __________ times a day.   Wrist extension 1. Sit with your left / right forearm supported on a table and your hand resting palm-down over the edge of the table. Your elbow should be bent to a 90-degree angle (right angle) and rest below the level of your shoulder. 2. Hold a __________ weight in your hand. Or, hold a rubber exercise band or tube in both hands, keeping your hands at the same level and hip distance apart. There should be a slight tension in the exercise band or tube. 3. Slowly lift your hand up toward the ceiling (extension). 4. Hold this position for __________ seconds. 5. Slowly lower your hand back to the starting position.  Repeat __________ times. Complete this exercise __________ times a day.   Ulnar deviation 1. Stand with a __________ weight in your left / right hand. Or, sit with your healthy hand supported, and hold on to a rubber exercise band or tube with both hands. There should be a slight tension in the exercise band or tube. 2. Move your wrist so your pinkie travels toward your forearm and your thumb moves away from your forearm (ulnar deviation). 3. Hold this position for __________ seconds. 4. Slowly return to the starting position. Repeat __________ times. Complete this exercise __________ times a day. Radial deviation 1. Stand with a __________ weight in your left / right hand. Or, sit and hold on to a rubber exercise  band or tube with both hands while your left / right arm is supported on a table and the other arm is below the table. There should be a slight tension in the exercise band or tube. ? If you are holding a weight, raise your left / right hand so your thumb moves toward your forearm at a comfortable height. You will not need to raise your hand very far. ? If you are holding an exercise band or tube, pull on it to raise your thumb toward the ceiling. You will not need to raise your hand very far. 2. Hold this position for __________ seconds. 3. Slowly lower your wrist to the starting position. Repeat __________ times. Complete this exercise __________ times a day. Forearm rotation, supination 1. Sit with your left / right forearm supported on a table. Keep your hand resting palm-down and your wrist over the edge of the table. Your elbow should be at your side and bent to a 90-degree angle (right angle). Keep your wrist stable. Do not allow it to bend backward or forward during the exercise. 2. Gently hold a lightweight hammer with your left / right hand. 3. Without moving your elbow or wrist, slowly turn (rotate) your forearm so your palm faces up toward the ceiling (supination). 4. Hold this position for __________ seconds. 5. Slowly return your forearm to the starting position. Repeat __________ times. Complete this exercise __________ times a day.   Forearm rotation, pronation 1. Sit with your left / right forearm supported on a table. Keep your hand resting palm-up and your wrist over the edge of the table. Your elbow should be at your side and bent to a 90-degree angle (right angle). Keep your wrist stable. Do not allow it to bend backward or forward during the exercise. 2. Gently hold a lightweight hammer with your left / right hand. 3. Without moving your elbow or wrist, slowly turn (rotate) your palm down toward the floor (pronation). 4. Hold this position for __________ seconds. 5. Slowly  return your forearm to the starting position. Repeat __________ times. Complete this exercise __________ times a day.   Grip 1. Hold one of these items in your left / right hand: modeling clay, therapy putty, a dense sponge, a stress ball, or a large, rolled sock. 2. Squeeze as hard as you can without increasing any pain. 3. Hold this position for __________ seconds. 4. Slowly release your grip. Repeat __________ times. Complete this exercise __________ times a day.   This information is not intended to replace advice given to you by your health care provider. Make sure you discuss any questions you have with your health care provider. Document Revised: 09/15/2018 Document Reviewed: 07/23/2018 Elsevier Patient Education  [place].

## 2020-11-08 NOTE — Telephone Encounter (Signed)
Please schedule pt for OV.

## 2020-11-08 NOTE — Telephone Encounter (Signed)
Patient is scheduled   

## 2020-11-08 NOTE — Telephone Encounter (Signed)
Nurse Assessment Nurse: Gordy Savers, RN, Heather Date/Time (Eastern Time): 11/08/2020 1:32:37 PM Confirm and document reason for call. If symptomatic, describe symptoms. ---Caller states she has a blood pressure of 172/101, headaches..Nauseated, has passed. Went to sports doctor today. Has been told to keep track on BP that she may need to be placed on medications. Does the patient have any new or worsening symptoms? ---Yes Will a triage be completed? ---Yes Related visit to physician within the last 2 weeks? ---No Does the PT have any chronic conditions? (i.e. diabetes, asthma, this includes High risk factors for pregnancy, etc.) ---No Is this a behavioral health or substance abuse call? ---No Guidelines Guideline Title Affirmed Question Affirmed Notes Nurse Date/Time (Eastern Time) Blood Pressure - High [0] Systolic BP >= 370 OR Diastolic >= 80 AND [4] not taking BP medications [place] Nurse 11/08/2020 1:34:32 PM Disp. Time [place] Ghazi Time) Disposition Final User PLEASE NOTE: All timestamps contained within this report are represented as [place] Standard Time. CONFIDENTIALTY NOTICE: This fax transmission is intended only for the addressee. It contains information that is legally privileged, confidential or otherwise protected from use or disclosure. If you are not the intended recipient, you are strictly prohibited from reviewing, disclosing, copying using or disseminating any of this information or taking any action in reliance on or regarding this information. If you have received this fax in error, please notify us immediately by telephone so that we can arrange for its return to [place]. Phone: [phone], Toll-Free: [phone], Fax: [phone] Page: 2 of 2 Call Id: [id number] 11/08/2020 1:46:24 PM See PCP within 2 Weeks Yes Gordy Savers, RN, Soyla Murphy Disagree/Comply Comply Caller Understands Yes PreDisposition Did not know what to do Care Advice Given Per Guideline SEE PCP  WITHIN 2 WEEKS: * You need to be seen for this ongoing problem within the next 2 weeks. CALL BACK IF: * Weakness or numbness of the face, arm or leg on one side of the body occurs * Difficulty walking, difficulty talking, or severe headache occurs * Chest pain or difficulty breathing occurs * You become worse * Your blood pressure is over 160/100 CARE ADVICE given per High Blood Pressure (Adult) guideline. Comments User: Nicoletta Ba, RN Date/Time Eilene Ghazi Time): 11/08/2020 1:34:14 PM appointment with Dr. Yong Channel on 6/27 User: Nicoletta Ba, RN Date/Time [place] Ghazi Time): 11/08/2020 1:35:40 PM Current BP: 151/87 HR 88 User: Nicoletta Ba, RN Date/Time (Eastern Time): 11/08/2020 1:37:00 PM Chest pain on left side that she thinks was indigestion, last episode was last week. Right leg pain this AM , that felt like she had a knot in it. Pain is no longer there. Knot is down. User: Nicoletta Ba, RN Date/Time [place]ilene Ghazi Time): 11/08/2020 1:46:48 PM Patient schduled for appointment on 6/21 at [place]

## 2020-11-10 NOTE — Progress Notes (Signed)
Right wrist x-ray shows some mild arthritis changes.  No fractures visible.. No fracture is visible.

## 2020-11-13 NOTE — Progress Notes (Signed)
Pt viewed Dr. Corey's result note via MyChart.  

## 2020-11-16 ENCOUNTER — Ambulatory Visit: Payer: Medicare Other | Admitting: Family Medicine

## 2020-11-21 ENCOUNTER — Other Ambulatory Visit: Payer: Self-pay

## 2020-11-21 ENCOUNTER — Encounter: Payer: Self-pay | Admitting: Family Medicine

## 2020-11-21 ENCOUNTER — Ambulatory Visit (INDEPENDENT_AMBULATORY_CARE_PROVIDER_SITE_OTHER): Payer: Medicare Other | Admitting: Family Medicine

## 2020-11-21 VITALS — BP 156/92 | HR 83 | Temp 98.4°F | Ht 61.0 in | Wt 205.0 lb

## 2020-11-21 DIAGNOSIS — I10 Essential (primary) hypertension: Secondary | ICD-10-CM | POA: Diagnosis not present

## 2020-11-21 MED ORDER — AMLODIPINE BESYLATE 2.5 MG PO TABS: 2.5000 mg | ORAL_TABLET | Freq: Every day | ORAL | 3 refills | Status: DC

## 2020-11-21 NOTE — Patient Instructions (Addendum)
Blood pressure remains slightly high and you have also had some high readings at home.  Lets start amlodipine 2.5 mg before bed.  You are doing a good job monitoring blood pressure at home-please update me with home readings in about 2 weeks.  If blood pressure gets into the 100s over 60s or 70s and you feel poorly such as lightheadedness or dizziness we may need to reduce to half tablet.  I mainly want to lower your overall range of blood pressures and try to prevent numbers perhaps over 145 or 150.  Goal average would be less than 135/85 and the closer we get to 110/70 in the more ideal  Recommended follow up: No follow-ups on file.

## 2020-11-21 NOTE — Progress Notes (Signed)
Phone [phone] In person visit   Subjective:   Olivia Bender is a 70 y.o. year old very pleasant female patient who presents for/with See problem oriented charting Chief Complaint  Patient presents with   Hypertension   This visit occurred during the SARS-CoV-2 public health emergency.  Safety protocols were in place, including screening questions prior to the visit, additional usage of staff PPE, and extensive cleaning of exam room while observing appropriate contact time as indicated for disinfecting solutions.   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[place]) 05/18/2018    Priority: Medium   Asthma 11/21/2017    Priority: Medium   Generalized headaches     Priority: Medium   Fatty infiltration of liver 12/30/2014    Priority: Medium   Prediabetes 06/30/2014    Priority: Medium   Hypothyroidism 12/01/2010    Priority: Medium   Hyperlipidemia 12/01/2010    Priority: Medium   Osteopenia 12/01/2010    Priority: Medium   GE reflux 12/01/2010    Priority: Medium   Trochanteric bursitis of left hip 12/11/2015    Priority: Low   Statin intolerance 06/30/2014    Priority: Low   Left knee pain 06/30/2014    Priority: Low   Allergic rhinitis 12/01/2010    Priority: Low   Pulmonary nodule/lesion, solitary 02/21/2020   Aortic atherosclerosis ([place]) 12/11/2018    Medications- reviewed and updated Current Outpatient Medications  Medication Sig Dispense Refill   albuterol (VENTOLIN HFA) 108 (90 Base) MCG/ACT inhaler      Alirocumab (PRALUENT) 150 MG/ML SOAJ Inject 150 mg into the skin every 14 (fourteen) days. 2 mL 11   amLODipine (NORVASC) 2.5 MG tablet Take 1 tablet (2.5 mg total) by mouth daily. 90 tablet 3   aspirin 81 MG EC tablet Take 81 mg by mouth daily.     Azelastine HCl 137 MCG/SPRAY SOLN Place into the nose.     cyclobenzaprine  (FLEXERIL) 10 MG tablet Take 10 mg by mouth 3 (three) times daily as needed.     levocetirizine (XYZAL) 5 MG tablet Take 5 mg by mouth every evening. Prn only     levothyroxine (SYNTHROID) 75 MCG tablet TAKE 1 TABLET BY MOUTH EVERY DAY 90 tablet 3   omeprazole (PRILOSEC OTC) 20 MG tablet Take 20 mg by mouth as needed.     sertraline (ZOLOFT) 50 MG tablet TAKE 1 TABLET BY MOUTH EVERY DAY 90 tablet 3   triamcinolone (NASACORT) 55 MCG/ACT AERO nasal inhaler Place 2 sprays into the nose daily.     No current facility-administered medications for this visit.     Objective:  BP (!) 156/92   Pulse 83   Temp 98.4 F (36.9 C) (Temporal)   Ht 5\' 1"  (1.549 m)   Wt 205 lb (93 kg)   SpO2 98%   BMI 38.73 kg/m  Gen: NAD, resting comfortably CV: RRR no murmurs rubs or gallops Lungs: CTAB no crackles, wheeze, rhonchi Ext: trace edema bilaterally     Assessment and Plan  #Social Update- Her and her husband went to [place], [place] on [employer] Day.  Her grand-daughter graduated [employer] on Friday 11/17/20.  #hypertension S: medication: None. -Blood pressure was mildly elevated at last visit in April-patient has had weight gain over the last year or 2 and is up 30 pounds.  And sports medicine blood pressure  was extremely elevated at 172/102 - she has headaches intermittently when blood pressures are high-typically over 140 but sometimes up to 150s. Denies chest pain, SOB, or blurry vision. Home readings #s: average numbers range as low as 110/70 to 150/90 BP Readings from Last 3 Encounters:  11/21/20 (!) 156/92  11/08/20 (!) 172/102  09/13/20 (!) 142/82  A/P: Poor control without medication. We are going to start a trial of Amlodipine 2.5 mg for a 90 day supply before bed. You are doing a good with taking you blood pressure at home. Please update me with readings and progression of headaches in 2 weeks.    From AVS "  Patient Instructions  Blood pressure remains slightly high and you have  also had some high readings at home.  Lets start amlodipine 2.5 mg before bed.  You are doing a good job monitoring blood pressure at home-please update me with home readings in about 2 weeks.  If blood pressure gets into the 100s over 60s or 70s and you feel poorly such as lightheadedness or dizziness we may need to reduce to half tablet.  I mainly want to lower your overall range of blood pressures and try to prevent numbers perhaps over 145 or 150.  Goal average would be less than 135/85 and the closer we get to 110/70 in the more ideal  Recommended follow up: No follow-ups on file. " Future Appointments  Date Time Provider [place]  12/20/2020 10:00 AM Gregor Hams, MD [facility] None  12/26/2020 10:30 AM [facility] CCM PHARMACIST [facility]  01/26/2021 11:45 AM [facility] HEALTH COACH [facility]  03/19/2021  9:40 AM Yong Channel, Brayton Mars, MD [facility]    Lab/Order associations:   ICD-10-CM   1. Essential hypertension  I10       Meds ordered this encounter  Medications   amLODipine (NORVASC) 2.5 MG tablet    Sig: Take 1 tablet (2.5 mg total) by mouth daily.    Dispense:  90 tablet    Refill:  3    I,Harris Phan,acting as a scribe for Garret Reddish, MD.,have documented all relevant documentation on the behalf of Garret Reddish, MD,as directed by  Garret Reddish, MD while in the presence of Garret Reddish, MD.   I, Garret Reddish, MD, have reviewed all documentation for this visit. The documentation on 11/21/20 for the exam, diagnosis, procedures, and orders are all accurate and complete.   Return precautions advised.  Garret Reddish, MD

## 2020-12-01 DIAGNOSIS — Z1231 Encounter for screening mammogram for malignant neoplasm of breast: Secondary | ICD-10-CM | POA: Diagnosis not present

## 2020-12-01 LAB — HM MAMMOGRAPHY

## 2020-12-06 ENCOUNTER — Encounter: Payer: Self-pay | Admitting: Family Medicine

## 2020-12-15 DIAGNOSIS — E785 Hyperlipidemia, unspecified: Secondary | ICD-10-CM | POA: Diagnosis not present

## 2020-12-15 LAB — HEPATIC FUNCTION PANEL
ALT: 27 IU/L (ref 0–32)
AST: 38 IU/L (ref 0–40)
Albumin: 4.4 g/dL (ref 3.8–4.8)
Alkaline Phosphatase: 78 IU/L (ref 44–121)
Bilirubin Total: 0.5 mg/dL (ref 0.0–1.2)
Bilirubin, Direct: 0.14 mg/dL (ref 0.00–0.40)
Total Protein: 7.1 g/dL (ref 6.0–8.5)

## 2020-12-15 LAB — LIPID PANEL
Chol/HDL Ratio: 3.2 ratio (ref 0.0–4.4)
Cholesterol, Total: 137 mg/dL (ref 100–199)
HDL: 43 mg/dL (ref >39)
LDL Chol Calc (NIH): 64 mg/dL (ref 0–99)
Triglycerides: 177 mg/dL — ABNORMAL HIGH (ref 0–149)
VLDL Cholesterol Cal: 30 mg/dL (ref 5–40)

## 2020-12-20 ENCOUNTER — Ambulatory Visit: Payer: Medicare Other | Admitting: Family Medicine

## 2020-12-26 ENCOUNTER — Ambulatory Visit (INDEPENDENT_AMBULATORY_CARE_PROVIDER_SITE_OTHER): Payer: Medicare Other | Admitting: Pharmacist

## 2020-12-26 DIAGNOSIS — K219 Gastro-esophageal reflux disease without esophagitis: Secondary | ICD-10-CM

## 2020-12-26 DIAGNOSIS — I10 Essential (primary) hypertension: Secondary | ICD-10-CM

## 2020-12-26 NOTE — Progress Notes (Signed)
Chronic Care Management Pharmacy Note  12/26/2020 Name:  Olivia Bender MRN:  [id number] DOB:  1951/03/21  Subjective: Olivia Bender is an 70 y.o. year old female who is a primary patient of Hunter, Brayton Mars, MD.  The CCM team was consulted for assistance with disease management and care coordination needs.    Engaged with patient by telephone for initial visit in response to provider referral for pharmacy case management and/or care coordination services.   Consent to Services:  The patient was given the following information about Chronic Care Management services today, agreed to services, and gave verbal consent: 1. CCM service includes personalized support from designated clinical staff supervised by the primary care provider, including individualized plan of care and coordination with other care providers 2. 24/7 contact phone numbers for assistance for urgent and routine care needs. 3. Service will only be billed when office clinical staff spend 20 minutes or more in a month to coordinate care. 4. Only one practitioner may furnish and bill the service in a calendar month. 5.The patient may stop CCM services at any time (effective at the end of the month) by phone call to the office staff. 6. The patient will be responsible for cost sharing (co-pay) of up to 20% of the service fee (after annual deductible is met). Patient agreed to services and consent obtained.  Patient Care Team: Marin Olp, MD as PCP - General (Family Medicine) Garrel Ridgel, DPM as Consulting Physician (Podiatry) Debara Pickett, Nadean Corwin, MD as Consulting Physician (Cardiology) Harold Hedge, Darrick Grinder, MD as Consulting Physician (Allergy and Immunology) Edythe Clarity, [facility] (Pharmacist)  Recent office visits: 11/21/20 Yong Channel) - started on Amlodipine 2.28m daily due to multiple elevated readings.  Hospital visits: None in previous 6 months  Objective:  Lab Results  Component Value Date   CREATININE 0.80  09/13/2020   CREATININE 0.71 12/10/2019   CREATININE 0.73 05/18/2018    Lab Results  Component Value Date   HGBA1C 5.4 12/10/2019   Last diabetic Eye exam: No results found for: HMDIABEYEEXA  Last diabetic Foot exam: No results found for: HMDIABFOOTEX      Component Value Date/Time   CHOL 137 12/15/2020 0914   TRIG 177 (H) 12/15/2020 0914   HDL 43 12/15/2020 0914   CHOLHDL 3.2 12/15/2020 0914   CHOLHDL 4.2 12/10/2019 1628   VLDL 21.4 05/18/2018 0936   LDLCALC 64 12/15/2020 0914   LDLCALC 96 12/10/2019 1628   LDLDIRECT 184.0 11/21/2017 1106    Hepatic Function Latest Ref Rng & Units 12/15/2020 09/13/2020 12/10/2019  Total Protein 6.0 - 8.5 g/dL 7.1 6.8 7.3  Albumin 3.8 - 4.8 g/dL 4.4 3.8 -  AST 0 - 40 IU/L 38 26 24  ALT 0 - 32 IU/L _0 Alk Phosphatase 44 - 121 IU/L 78 58 -  Total Bilirubin 0.0 - 1.2 mg/dL 0.5 0.4 0.3  Bilirubin, Direct 0.00 - 0.40 mg/dL 0.14 - -    Lab Results  Component Value Date/Time   TSH 1.64 09/13/2020 10:22 AM   TSH 1.58 12/10/2019 04:28 PM    CBC Latest Ref Rng & Units 09/13/2020 03/14/2020 12/10/2019  WBC 4.0 - 10.5 K/uL 5.6 8.3 6.8  Hemoglobin 12.0 - 15.0 g/dL 11.8(L) 12.4 11.4(L)  Hematocrit 36.0 - 46.0 % 35.3(L) 37.1 34.6(L)  Platelets 150.0 - 400.0 K/uL 227.0 268 257    Lab Results  Component Value Date/Time   VD25OH 43 05/22/2017 10:45 AM   VD25OH 31  12/11/2015 01:18 PM    Clinical ASCVD:  The 10-year ASCVD risk score Mikey Bussing DC Jr., et al., 2013) is: 17.1%   Values used to calculate the score:     Age: 89 years     Sex: Female     Is Non-Hispanic African American: No     Diabetic: No     Tobacco smoker: No     Systolic Blood Pressure: 540 mmHg     Is BP treated: Yes     HDL Cholesterol: 43 mg/dL     Total Cholesterol: 137 mg/dL    Social History   Tobacco Use  Smoking Status Never  Smokeless Tobacco Never   BP Readings from Last 3 Encounters:  11/21/20 (!) 156/92  11/08/20 (!) 172/102  09/13/20 (!) 142/82    Pulse Readings from Last 3 Encounters:  11/21/20 83  11/08/20 82  09/13/20 88   Wt Readings from Last 3 Encounters:  11/21/20 205 lb (93 kg)  11/08/20 207 lb 3.2 oz (94 kg)  09/13/20 205 lb 3.2 oz (93.1 kg)    Assessment: Review of patient past medical history, allergies, medications, health status, including review of consultants reports, laboratory and other test data, was performed as part of comprehensive evaluation and provision of chronic care management services.   SDOH:  (Social Determinants of Health) assessments and interventions performed: Yes   CCM Care Plan  Allergies  Allergen Reactions   Tetanus Toxoids Anaphylaxis   Latex Rash   Ezetimibe Other (See Comments)    Myalgia   Statins Other (See Comments)    Lipitor, Crestor, Livalo -myalgias    Medications Reviewed Today     Reviewed by Linton Ham, CMA (Certified Medical Assistant) on 11/21/20 at [id number]  Med List Status: <None>   Medication Order Taking? Sig Documenting Provider Last Dose Status Informant  albuterol (VENTOLIN HFA) 108 (90 Base) MCG/ACT inhaler [id number] Yes  [provider] Taking Active            Med Note Willette Brace   Fri Jan 21, 2020 11:30 AM) As needed  Alirocumab (PRALUENT) 150 MG/ML SOAJ [id number] Yes Inject 150 mg into the skin every 14 (fourteen) days. Pixie Casino, MD Taking Active   aspirin 81 MG EC tablet [id number] Yes Take 81 mg by mouth daily. [provider] Taking Active   Azelastine HCl 137 MCG/SPRAY SOLN [id number] Yes Place into the nose. [provider] Taking Active            Med Note Willette Brace   Fri Jan 21, 2020 11:30 AM) As needed   cyclobenzaprine (FLEXERIL) 10 MG tablet [id number] Yes Take 10 mg by mouth 3 (three) times daily as needed. [provider] Taking Active            Med Note Willette Brace   Fri Jan 21, 2020 11:31 AM) As needed  levocetirizine (XYZAL) 5 MG tablet [id number] Yes Take 5 mg by  mouth every evening. Prn only [provider] Taking Active   levothyroxine (SYNTHROID) 75 MCG tablet [id number] Yes TAKE 1 TABLET BY MOUTH EVERY DAY Marin Olp, MD Taking Active   omeprazole (PRILOSEC OTC) 20 MG tablet [id number] Yes Take 20 mg by mouth as needed. [provider] Taking Active   sertraline (ZOLOFT) 50 MG tablet [id number] Yes TAKE 1 TABLET BY MOUTH EVERY DAY Marin Olp, MD Taking Active   triamcinolone (NASACORT) 55 MCG/ACT AERO nasal inhaler [id number] Yes Place 2  sprays into the nose daily. [provider] Taking Active            Med Note Willette Brace   Fri Jan 21, 2020 11:31 AM) As needed             Patient Active Problem List   Diagnosis Date Noted   Pulmonary nodule/lesion, solitary 02/21/2020   Essential hypertension 12/10/2019   Aortic atherosclerosis ([place]) 12/11/2018   Major depression in full remission ([place]) 05/18/2018   Asthma 11/21/2017   Generalized headaches    Trochanteric bursitis of left hip 12/11/2015   Fatty infiltration of liver 12/30/2014   Statin intolerance 06/30/2014   Left knee pain 06/30/2014   Prediabetes 06/30/2014   Hypothyroidism 12/01/2010   Hyperlipidemia 12/01/2010   Osteopenia 12/01/2010   Allergic rhinitis 12/01/2010   GE reflux 12/01/2010    Immunization History  Administered Date(s) Administered   Fluad Quad(high Dose 65+) 03/14/2020   Influenza, High Dose Seasonal PF 04/20/2018, 03/04/2019   Influenza,inj,Quad PF,6+ Mos 05/22/2017   Influenza-Unspecified 04/17/2014   PFIZER(Purple Top)SARS-COV-2 Vaccination 12/17/2019, 01/06/2020, 06/08/2020   Pneumococcal Conjugate-13 07/28/2015   Pneumococcal Polysaccharide-23 07/14/2005, 11/21/2017   Zoster Recombinat (Shingrix) 04/20/2019, 09/15/2019   Zoster, Live 12/23/2011    Conditions to be addressed/monitored: Asthma, HTN, Aortic Atherosclerosis, Osteopenia, GERD, Hypothyroidism, MDD, Statin intolerance, Fatty liver  Care  Plan : [place]  Updates made by Edythe Clarity, RPH since 12/26/2020 12:00 AM     Problem: Asthma, HTN, Aortic Atherosclerosis, Osteopenia, GERD, Hypothyroidism, MDD, Statin intolerance, Fatty liver   Priority: High     Long-Range Goal: Disease Management   Start Date: 09/26/2020  Expected End Date: 09/26/2021  Recent Progress: On track  Priority: High  Note:   Current Barriers:  Ongoing GERD Symptoms, needing to come up with excercise goals  Pharmacist Clinical Goal(s):  Patient will contact provider office for questions/concerns as evidenced notation of same in electronic health record through collaboration with PharmD and provider.   Interventions: 1:1 collaboration with Marin Olp, MD regarding development and update of comprehensive plan of care as evidenced by provider attestation and co-signature Inter-disciplinary care team collaboration (see longitudinal plan of care) Comprehensive medication review performed; medication list updated in electronic medical record No med changes at this time  Hypertension (BP goal <130/80) -Controlled based on last three OV BP readings  -Current treatment: Amlodipine 2.62m daily -Medications previously tried: no medications, some previous weight loss seemed to have improved BP management.  -Current home readings: none provided. Does have BP cuff. -Current dietary patterns: trying not to snack - cutting down on sweets. Denies routine intake of fruits/vegetables. Current exercise habits: trying to walk but hip had hurt so had quit and did not revisit.   -Denies hypotensive/hypertensive symptoms -Counseled to monitor BP at home 1-2x every 2 weeks, document, and provide log at future appointments -Recommended to continue current medication   Update 12/26/20 Started Amlodipine 2.558mdaily about one month ago. Home BP readings: 119/76 today Swelling? Denies any swelling She reports she is riding the stationary bike  about 4 times per week for 20 minutes!! Congratulated her for this huge step in lifestyle modification.  Continue current meds for now - BP monitoring to continue and regular follow up  Depression/Anxiety (Goal: minimize symptoms) -Controlled  -Feels sertraline has helped her a lot, did have concern over husbands health for a while. Feels there are some symptoms of depression but is not wanting to further adjust medication or seek  additional support. -Current treatment: Sertraline 50 mg once daily  -PHQ9: not performed today, last 12 on 09/13/2020 -Educated on Benefits of medication for symptom control -Recommended to continue current medication  Hyperlipidemia: (LDL goal < 70) -Likely controlled, is needed updated labs to further assess. Reports to have just given first Praluent injection last week. No cost concerns due to receiving assistance through [employer].  -HTN, Aortic Atherosclerosis  -Statin intolerance -Current treatment: Praluent 150 mg once every 14 days (08/29/2020 - Dr Debara Pickett) -Educated on Cholesterol goals;  -Recommended to continue current medication  -Plan to walk 2-3x/week  Asthma (Goal: control symptoms and prevent exacerbations) -Controlled -Current treatment  Albuterol 108 mcg/act PRN -Exacerbations requiring treatment in last 6 months: 0 -Patient denies consistent use of maintenance inhaler. Not needed.  -Frequency of rescue inhaler use: has not used  -Counseled on When to use rescue inhaler -Recommended to continue current medication  Hypothyroidism (Goal: maintain normal TSH) -Controlled  -Admits to missed dose once every 1-2 weeks - will start focusing on taking first thing in AM consistently  -Current treatment  Levothyroxine 75 mcg once daily -Recommended to continue current medication  GERD (minimize symptoms) -Not ideally controlled -Symptoms 1-2x/week. Large amounts of coffee and does have a glass of lemonade once daily. Has given up on  omeprazole due to ongoing symptoms and just uses tums. -Regimen: Omeprazole 20 mg once daily OTC (held) Reviewed reflux triggers at length Will check on dietary progress next month  Update 12/26/20 Working on triggers? Yes Symptoms? Comes and goes Taking Omeprazole? Yes she is taking it every night after dinner Recommended she switch to about 30-60 minutes before dinner for optimal efficacy.  Patient agrees to try this and we will follow up to see if this helps her symptoms.  Patient Goals/Self-Care Activities Patient will:  - take medications as prescribed target a minimum of 150 minutes of moderate intensity exercise weekly engage in dietary modifications by reducing coffee and lemonade intake. goal exercise is 2-3x/week  Follow Up Plan: 3 month PharmD fu call Medication Assistance: None required.  Patient affirms current coverage meets needs.  [facility]  [id number] [place]Phone: [phone]: [phone] Follow Up:  Patient agrees to Care Plan and Follow-up.      Future Appointments  Date Time Provider DTyrone 12/27/2020  8:45 AM HPixie Casino MD [facility]  01/26/2021 11:45 AM [facility] HEALTH COACH [facility]  03/19/2021  9:40 AM HYong ChannelSBrayton Mars MD [facility]   JMadelin Rear Pharm.D., BCGP Clinical Pharmacist [facility]([phone]

## 2020-12-26 NOTE — Patient Instructions (Addendum)
Visit Information   Goals Addressed             This Visit's Progress    Track and Manage My Blood Pressure-Hypertension   On track    Timeframe:  Long-Range Goal Priority:  High Follow Up Date 12/2020  - check blood pressure weekly    Why is this important?   You won't feel high blood pressure, but it can still hurt your blood vessels.  High blood pressure can cause heart or kidney problems. It can also cause a stroke.  Making lifestyle changes like losing a little weight or eating less salt will help.  Checking your blood pressure at home and at different times of the day can help to control blood pressure.  If the doctor prescribes medicine remember to take it the way the doctor ordered.  Call the office if you cannot afford the medicine or if there are questions about it.     Notes:   12/26/20 - Amlodipine 2.'5mg'$  recently added - Bps have improved       Patient Care Plan: [place] Plan     Problem Identified: Asthma, HTN, Aortic Atherosclerosis, Osteopenia, GERD, Hypothyroidism, MDD, Statin intolerance, Fatty liver   Priority: High     Long-Range Goal: Disease Management   Start Date: 09/26/2020  Expected End Date: 09/26/2021  Recent Progress: On track  Priority: High  Note:   Current Barriers:  Ongoing GERD Symptoms, needing to come up with excercise goals  Pharmacist Clinical Goal(s):  Patient will contact provider office for questions/concerns as evidenced notation of same in electronic health record through collaboration with PharmD and provider.   Interventions: 1:1 collaboration with Marin Olp, MD regarding development and update of comprehensive plan of care as evidenced by provider attestation and co-signature Inter-disciplinary care team collaboration (see longitudinal plan of care) Comprehensive medication review performed; medication list updated in electronic medical record No med changes at this time  Hypertension (BP goal  <130/80) -Controlled based on last three OV BP readings  -Current treatment: Amlodipine 2.'5mg'$  daily -Medications previously tried: no medications, some previous weight loss seemed to have improved BP management.  -Current home readings: none provided. Does have BP cuff. -Current dietary patterns: trying not to snack - cutting down on sweets. Denies routine intake of fruits/vegetables. Current exercise habits: trying to walk but hip had hurt so had quit and did not revisit.   -Denies hypotensive/hypertensive symptoms -Counseled to monitor BP at home 1-2x every 2 weeks, document, and provide log at future appointments -Recommended to continue current medication   Update 12/26/20 Started Amlodipine 2.'5mg'$  daily about one month ago. Home BP readings: 119/76 today Swelling? Denies any swelling She reports she is riding the stationary bike about 4 times per week for 20 minutes!! Congratulated her for this huge step in lifestyle modification.  Continue current meds for now - BP monitoring to continue and regular follow up  Depression/Anxiety (Goal: minimize symptoms) -Controlled  -Feels sertraline has helped her a lot, did have concern over husbands health for a while. Feels there are some symptoms of depression but is not wanting to further adjust medication or seek additional support. -Current treatment: Sertraline 50 mg once daily  -PHQ9: not performed today, last 12 on 09/13/2020 -Educated on Benefits of medication for symptom control -Recommended to continue current medication  Hyperlipidemia: (LDL goal < 70) -Likely controlled, is needed updated labs to further assess. Reports to have just given first Praluent injection last week. No cost concerns  due to receiving assistance through [employer].  -HTN, Aortic Atherosclerosis  -Statin intolerance -Current treatment: Praluent 150 mg once every 14 days (08/29/2020 - Dr Debara Pickett) -Educated on Cholesterol goals;  -Recommended to continue  current medication  -Plan to walk 2-3x/week  Asthma (Goal: control symptoms and prevent exacerbations) -Controlled -Current treatment  Albuterol 108 mcg/act PRN -Exacerbations requiring treatment in last 6 months: 0 -Patient denies consistent use of maintenance inhaler. Not needed.  -Frequency of rescue inhaler use: has not used  -Counseled on When to use rescue inhaler -Recommended to continue current medication  Hypothyroidism (Goal: maintain normal TSH) -Controlled  -Admits to missed dose once every 1-2 weeks - will start focusing on taking first thing in AM consistently  -Current treatment  Levothyroxine 75 mcg once daily -Recommended to continue current medication  GERD (minimize symptoms) -Not ideally controlled -Symptoms 1-2x/week. Large amounts of coffee and does have a glass of lemonade once daily. Has given up on omeprazole due to ongoing symptoms and just uses tums. -Regimen: Omeprazole 20 mg once daily OTC (held) Reviewed reflux triggers at length Will check on dietary progress next month  Update 12/26/20 Working on triggers? Yes Symptoms? Comes and goes Taking Omeprazole? Yes she is taking it every night after dinner Recommended she switch to about 30-60 minutes before dinner for optimal efficacy.  Patient agrees to try this and we will follow up to see if this helps her symptoms.  Patient Goals/Self-Care Activities Patient will:  - take medications as prescribed target a minimum of 150 minutes of moderate intensity exercise weekly engage in dietary modifications by reducing coffee and lemonade intake. goal exercise is 2-3x/week  Follow Up Plan: 3 month PharmD fu call Medication Assistance: None required.  Patient affirms current coverage meets needs.  [id number] [place]Phone: [phone]: [phone] Follow Up:  Patient agrees to  Care Plan and Follow-up.      Patient verbalizes understanding of instructions provided today and agrees to view in [place]  Telephone follow up appointment with pharmacy team member scheduled for: 3 months  CEdythe Clarity REld[place]

## 2020-12-27 ENCOUNTER — Other Ambulatory Visit: Payer: Self-pay

## 2020-12-27 ENCOUNTER — Ambulatory Visit (INDEPENDENT_AMBULATORY_CARE_PROVIDER_SITE_OTHER): Payer: Medicare Other | Admitting: Internal Medicine

## 2020-12-27 ENCOUNTER — Encounter (HOSPITAL_BASED_OUTPATIENT_CLINIC_OR_DEPARTMENT_OTHER): Payer: Self-pay | Admitting: Internal Medicine

## 2020-12-27 VITALS — BP 122/82 | HR 77 | Ht 61.0 in | Wt 204.2 lb

## 2020-12-27 DIAGNOSIS — Z8249 Family history of ischemic heart disease and other diseases of the circulatory system: Secondary | ICD-10-CM | POA: Diagnosis not present

## 2020-12-27 DIAGNOSIS — R931 Abnormal findings on diagnostic imaging of heart and coronary circulation: Secondary | ICD-10-CM | POA: Diagnosis not present

## 2020-12-27 DIAGNOSIS — E785 Hyperlipidemia, unspecified: Secondary | ICD-10-CM

## 2020-12-27 DIAGNOSIS — Z789 Other specified health status: Secondary | ICD-10-CM | POA: Diagnosis not present

## 2020-12-27 NOTE — Progress Notes (Signed)
LIPID CLINIC CONSULT NOTE  Chief Complaint:  Follow-up dyslipidemia  Primary Care Physician: Marin Olp, MD  HPI:  Olivia Bender is a 70 y.o. female who is being seen today for the evaluation of dyslipidemia at the request of Marin Olp, MD.  This is a pleasant 70 year old female with past medical history significant for dyslipidemia, hypothyroidism, anxiety, headaches and some osteopenia.  She had an episode of chest discomfort in 2007 and underwent nuclear stress testing which was negative but otherwise has had no other cardiac testing.  She does have a longstanding history of dyslipidemia but also significant obesity.  She reports she is lost 46 pounds over the last year.  Weight now is 164 with a BMI of 32.  Her blood pressures at goal today 128/82.  Most recent lipid profile after weight loss however actually showed an increase in LDL cholesterol.  Her total cholesterol was 275, triglycerides 107, HDL 52 and LDL 200.  LDL appears to have consistently run between 170 and 200 suggesting possible familial dyslipidemia.  Her [place] lipid score is 4 suggesting possible familial hyperlipidemia.  There is a history of heart disease mostly on her mom's side and her aunt maternal grandmother and grandfather and mom who had A. fib and stroke although she is not aware of significant dyslipidemia in these relatives.  She also has a sister who does not have any significant elevation in cholesterol that she is aware of in 2 children, neither of which have high cholesterol to her knowledge.  Unfortunately she also has statin intolerance.  She is previously been tried on Lipitor, Crestor, Livalo, Zetia and even reduced dose Crestor every other day and once weekly without tolerance due to significant myalgias and cramping of the lower extremities which resolved after discontinuing the medications.  03/14/2021  Olivia Bender returns today for follow-up of dyslipidemia.  She is successfully using  Repatha and denies any significant side effects with it.  She has had marked reduction in her cholesterol.  Her LDL is decreased from 200-90.  Most recent lipid profile showed total cholesterol 168, triglycerides 243, HDL 53 and LDL of 90.  This represents greater than 50% reduction in her LDL.  12/27/2020  Olivia Bender is seen today in follow-up.  I last saw her via telehealth visit.  She had discontinued her PCSK9 inhibitor due to having COVID-19.  Since then she says she has been slow to recover.  She has some intermittent tachycardia but heart rate is generally just over 100.  She has not restarted on Praluent due to a switch in her insurance.  This seems to be working well for her.  Her lipids have come down with total now 137, HDL 43, LDL 64 and triglycerides 177.  She did have a repeat CT scan to follow-up nodules in February which showed stable findings thought to be benign.  She has started on low-dose amlodipine for slightly elevated blood pressure but now has good control with 122/82 as her blood pressure today.  PMHx:  Past Medical History:  Diagnosis Date   Allergy    Anxiety    Arthritis    Constipation    Enlarged liver    Generalized headaches    since teenage years- usually can get away with tylenol   Hyperlipidemia    Osteopenia    Thyroid disease    hypothyroidism    Past Surgical History:  Procedure Laterality Date   arthroscopic left knee surgery  DILATION AND CURETTAGE OF UTERUS     monthlong period related. everything was ok   NASAL SINUS SURGERY  1986    FAMHx:  Family History  Problem Relation Age of Onset   Stroke Mother        17. never smoker   Hypertension Mother    Other Father        killed in car wreck 2012   Prostate cancer Father    Obesity Sister    Kidney cancer Son    Heart disease Maternal Grandmother    Heart disease Maternal Grandfather     SOCHx:   reports that she has never smoked. She has never used smokeless tobacco. She  reports that she does not drink alcohol and does not use drugs.  ALLERGIES:  Allergies  Allergen Reactions   Tetanus Toxoids Anaphylaxis   Latex Rash   Ezetimibe Other (See Comments)    Myalgia   Statins Other (See Comments)    Lipitor, Crestor, Livalo -myalgias    ROS: Pertinent items noted in HPI and remainder of comprehensive ROS otherwise negative.  HOME MEDS: Current Outpatient Medications on File Prior to Visit  Medication Sig Dispense Refill   albuterol (VENTOLIN HFA) 108 (90 Base) MCG/ACT inhaler      Alirocumab (PRALUENT) 150 MG/ML SOAJ Inject 150 mg into the skin every 14 (fourteen) days. 2 mL 11   amLODipine (NORVASC) 2.5 MG tablet Take 1 tablet (2.5 mg total) by mouth daily. 90 tablet 3   aspirin 81 MG EC tablet Take 81 mg by mouth daily.     Azelastine HCl 137 MCG/SPRAY SOLN Place into the nose.     cyclobenzaprine (FLEXERIL) 10 MG tablet Take 10 mg by mouth 3 (three) times daily as needed.     levocetirizine (XYZAL) 5 MG tablet Take 5 mg by mouth every evening. Prn only     levothyroxine (SYNTHROID) 75 MCG tablet TAKE 1 TABLET BY MOUTH EVERY DAY 90 tablet 3   omeprazole (PRILOSEC OTC) 20 MG tablet Take 20 mg by mouth as needed.     sertraline (ZOLOFT) 50 MG tablet TAKE 1 TABLET BY MOUTH EVERY DAY 90 tablet 3   triamcinolone (NASACORT) 55 MCG/ACT AERO nasal inhaler Place 2 sprays into the nose daily.     No current facility-administered medications on file prior to visit.    LABS/IMAGING: No results found for this or any previous visit (from the past 48 hour(s)). No results found.  LIPID PANEL:    Component Value Date/Time   CHOL 137 12/15/2020 0914   TRIG 177 (H) 12/15/2020 0914   HDL 43 12/15/2020 0914   CHOLHDL 3.2 12/15/2020 0914   CHOLHDL 4.2 12/10/2019 1628   VLDL 21.4 05/18/2018 0936   LDLCALC 64 12/15/2020 0914   LDLCALC 96 12/10/2019 1628   LDLDIRECT 184.0 11/21/2017 1106    WEIGHTS: Wt Readings from Last 3 Encounters:  12/27/20 204 lb 3.2  oz (92.6 kg)  11/21/20 205 lb (93 kg)  11/08/20 207 lb 3.2 oz (94 kg)    VITALS: BP 122/82   Pulse 77   Ht '5\' 1"'$  (1.549 m)   Wt 204 lb 3.2 oz (92.6 kg)   SpO2 99%   BMI 38.58 kg/m   EXAM: Deferred  EKG: Deferred  ASSESSMENT: Elevated LDL cholesterol Dutch score 4 -possible FH Family history of coronary disease Statin intolerance CAC score of 7.  PLAN: 1.   Olivia Bender continues to do well now on Praluent.  She had  a low calcium score but elevated LDL cholesterol, statin intolerance and a family history of coronary disease.  Her cholesterols not very well controlled with LDL less than 70.  Continue to work on diet and more physical activity.  Blood pressure is controlled.  Plan follow-up with me annually or sooner as necessary.  Olivia Casino, MD, [facility], [place] Director of the Advanced Lipid Disorders &  Cardiovascular Risk Reduction Clinic Diplomate of the American Board of Clinical Lipidology Attending Cardiologist  Direct Dial: [phone]  Fax: [phone]  Website:  [email or web address] 12/27/2020, 8:56 AM

## 2020-12-27 NOTE — Patient Instructions (Signed)
Medication Instructions:  Your physician recommends that you continue on your current medications as directed. Please refer to the Current Medication list given to you today.  *If you need a refill on your cardiac medications before your next appointment, please call your pharmacy*   Lab Work: FASTING lab work in about 1 year -- complete about 1 week before your next visit with Dr. Debara Pickett   If you have labs (blood work) drawn today and your tests are completely normal, you will receive your results only by: [place] (if you have [place]) OR A paper copy in the mail If you have any lab test that is abnormal or we need to change your treatment, we will call you to review the results.   Testing/Procedures: NONE   Follow-Up: At [facility], you and your health needs are our priority.  As part of our continuing mission to provide you with exceptional heart care, we have created designated Provider Care Teams.  These Care Teams include your primary Cardiologist (physician) and Advanced Practice Providers (APPs -  Physician Assistants and Nurse Practitioners) who all work together to provide you with the care you need, when you need it.  We recommend signing up for the patient portal called "MyChart".  Sign up information is provided on this After Visit Summary.  MyChart is used to connect with patients for Virtual Visits (Telemedicine).  Patients are able to view lab/test results, encounter notes, upcoming appointments, etc.  Non-urgent messages can be sent to your provider as well.   To learn more about what you can do with MyChart, go to [email or web address].    Your next appointment:   12 month(s) - lipid clinic  The format for your next appointment:   In Person or Virtual  Provider:   K. Mali Hilty, MD   Other Instructions

## 2021-01-04 DIAGNOSIS — J310 Chronic rhinitis: Secondary | ICD-10-CM | POA: Diagnosis not present

## 2021-01-04 DIAGNOSIS — H1045 Other chronic allergic conjunctivitis: Secondary | ICD-10-CM | POA: Diagnosis not present

## 2021-01-04 DIAGNOSIS — J4520 Mild intermittent asthma, uncomplicated: Secondary | ICD-10-CM | POA: Diagnosis not present

## 2021-01-04 DIAGNOSIS — K219 Gastro-esophageal reflux disease without esophagitis: Secondary | ICD-10-CM | POA: Diagnosis not present

## 2021-01-09 ENCOUNTER — Telehealth: Payer: Self-pay | Admitting: Family Medicine

## 2021-01-09 NOTE — Telephone Encounter (Signed)
Copied from [place] [id number]. Topic: Medicare AWV >> Jan 09, 2021 12:55 PM Harris-Coley, Hannah Beat wrote: Reason for CRM: Left message for patient to schedule Annual Wellness Visit.  Please schedule with Nurse Health Advisor Charlott Rakes, RN at [facility].  Please call [phone] to reschedule appt

## 2021-01-26 ENCOUNTER — Ambulatory Visit: Payer: Medicare Other

## 2021-01-26 DIAGNOSIS — Z780 Asymptomatic menopausal state: Secondary | ICD-10-CM | POA: Diagnosis not present

## 2021-01-26 LAB — HM DEXA SCAN

## 2021-01-29 ENCOUNTER — Encounter: Payer: Self-pay | Admitting: Family Medicine

## 2021-02-07 ENCOUNTER — Encounter: Payer: Self-pay | Admitting: Family Medicine

## 2021-02-13 ENCOUNTER — Telehealth: Payer: Self-pay | Admitting: Pharmacist

## 2021-02-13 NOTE — Chronic Care Management (AMB) (Addendum)
Chronic Care Management Pharmacy Assistant   Name: CALLIEANN KROMER  MRN: [id number] DOB: [birth year]   Reason for Encounter: Disease State call for HTN   Recent office visits:  None since 12/26/20  Recent consult visits:  12/27/20 Lyman Bishop MD Cardiology. No med changes. Follow up in 1 year.  Hospital visits:  None since 12/26/20  Medications: Outpatient Encounter Medications as of 02/13/2021  Medication Sig Note   albuterol (VENTOLIN HFA) 108 (90 Base) MCG/ACT inhaler  01/21/2020: As needed   Alirocumab (PRALUENT) 150 MG/ML SOAJ Inject 150 mg into the skin every 14 (fourteen) days.    amLODipine (NORVASC) 2.5 MG tablet Take 1 tablet (2.5 mg total) by mouth daily.    aspirin 81 MG EC tablet Take 81 mg by mouth daily.    Azelastine HCl 137 MCG/SPRAY SOLN Place into the nose. 01/21/2020: As needed    cyclobenzaprine (FLEXERIL) 10 MG tablet Take 10 mg by mouth 3 (three) times daily as needed. 01/21/2020: As needed   levocetirizine (XYZAL) 5 MG tablet Take 5 mg by mouth every evening. Prn only    levothyroxine (SYNTHROID) 75 MCG tablet TAKE 1 TABLET BY MOUTH EVERY DAY    omeprazole (PRILOSEC OTC) 20 MG tablet Take 20 mg by mouth as needed.    sertraline (ZOLOFT) 50 MG tablet TAKE 1 TABLET BY MOUTH EVERY DAY    triamcinolone (NASACORT) 55 MCG/ACT AERO nasal inhaler Place 2 sprays into the nose daily. 01/21/2020: As needed    No facility-administered encounter medications on file as of 02/13/2021.   Reviewed chart prior to disease state call. Spoke with patient regarding BP  Recent Office Vitals: BP Readings from Last 3 Encounters:  12/27/20 122/82  11/21/20 (!) 156/92  11/08/20 (!) 172/102   Pulse Readings from Last 3 Encounters:  12/27/20 77  11/21/20 83  11/08/20 82    Wt Readings from Last 3 Encounters:  12/27/20 204 lb 3.2 oz (92.6 kg)  11/21/20 205 lb (93 kg)  11/08/20 207 lb 3.2 oz (94 kg)     Kidney Function Lab Results  Component Value Date/Time   CREATININE  0.80 09/13/2020 10:22 AM   CREATININE 0.71 12/10/2019 04:28 PM   CREATININE 0.73 05/18/2018 09:36 AM   CREATININE 0.75 05/22/2017 10:45 AM   GFR 74.69 09/13/2020 10:22 AM   GFRNONAA 82 05/22/2017 10:45 AM   GFRAA 96 05/22/2017 10:45 AM    BMP Latest Ref Rng & Units 09/13/2020 12/10/2019 05/18/2018  Glucose 70 - 99 mg/dL 90 86 87  BUN 6 - 23 mg/dL '20 16 12  '$ Creatinine 0.40 - 1.20 mg/dL 0.80 0.71 0.73  BUN/Creat Ratio 6 - 22 (calc) - NOT APPLICABLE -  Sodium A999333 - 145 mEq/L 140 142 141  Potassium 3.5 - 5.1 mEq/L 4.1 4.2 4.8  Chloride 96 - 112 mEq/L 106 108 106  CO2 19 - 32 mEq/L '28 25 29  '$ Calcium 8.4 - 10.5 mg/dL 8.9 9.7 9.5    Current antihypertensive regimen:  Amlodipine 2.5 mg 1 tablet daily   How often are you checking your Blood Pressure? 1-2x per week  Current home BP readings: 02/12/21 130/81 90 pulse 02/10/21 115/76 pulse 83  What recent interventions/DTPs have been made by any provider to improve Blood Pressure control since last CPP Visit: Pt states no recent changes  Any recent hospitalizations or ED visits since last visit with CPP? No recent visits   What diet changes have been made to improve Blood Pressure Control?  Pt  states she has not made any changes  What exercise is being done to improve your Blood Pressure Control?  Pt was riding bicycle daily but hasn't in about a week. Pt states she will start again soon.    Adherence Review: Is the patient currently on ACE/ARB medication? Yes Does the patient have >5 day gap between last estimated fill dates? No    Care Gaps: Medicare Annual Wellness: Appt on 02/19/21 Ophthalmology Exam: N/A Foot Exam: N/A Hemoglobin A1C: 12/10/19 (5.4) Colonoscopy: Next due on 12/07/24 Dexa Scan: Completed Mammogram: Next due on 12/01/21   Star Rating Drugs: None   Future Appointments  Date Time Provider [place]  02/19/2021  8:45 AM [facility] HEALTH COACH [facility]  03/19/2021  9:40 AM Marin Olp, MD [facility]  [facility]  04/03/2021  3:30 PM [facility] CCM PHARMACIST [facility]    I rescheduled pt Annual Wellness for 02/19/21 @ 8:45am  [place]  10 minutes spent in review, coordination, and documentation.  Reviewed by: Beverly Milch, PharmD Clinical Pharmacist [phone]

## 2021-02-19 ENCOUNTER — Other Ambulatory Visit: Payer: Self-pay

## 2021-02-19 ENCOUNTER — Ambulatory Visit (INDEPENDENT_AMBULATORY_CARE_PROVIDER_SITE_OTHER): Payer: Medicare Other

## 2021-02-19 VITALS — BP 130/68 | HR 79 | Temp 97.3°F | Wt 200.6 lb

## 2021-02-19 DIAGNOSIS — Z23 Encounter for immunization: Secondary | ICD-10-CM | POA: Diagnosis not present

## 2021-02-19 DIAGNOSIS — Z0000 Encounter for general adult medical examination without abnormal findings: Secondary | ICD-10-CM | POA: Diagnosis not present

## 2021-02-19 NOTE — Patient Instructions (Signed)
[name] , Thank you for taking time to come for your Medicare Wellness Visit. I appreciate your ongoing commitment to your health goals. Please review the following plan we discussed and let me know if I can assist you in the future.   Screening recommendations/referrals: Colonoscopy: Done 12/08/19 repeat every 5 years due 12/07/24 Mammogram: Done 12/01/20 repeat every year Bone Density: Done 01/26/21 repeat every 2 years  Recommended yearly ophthalmology/optometry visit for glaucoma screening and checkup Recommended yearly dental visit for hygiene and checkup  Vaccinations: Influenza vaccine: Due  Pneumococcal vaccine: Completed   Shingles vaccine: Completed 04/20/19 & 09/15/19     Covid-19:Completed 7/16, 8/5, & 06/08/20  Advanced directives: Please bring a copy of your health care power of attorney and living will to the office at your convenience.   Conditions/risks identified: Lose weight   Next appointment: Follow up in one year for your annual wellness visit    Preventive Care 65 Years and Older, Female Preventive care refers to lifestyle choices and visits with your health care provider that can promote health and wellness. What does preventive care include? A yearly physical exam. This is also called an annual well check. Dental exams once or twice a year. Routine eye exams. Ask your health care provider how often you should have your eyes checked. Personal lifestyle choices, including: Daily care of your teeth and gums. Regular physical activity. Eating a healthy diet. Avoiding tobacco and drug use. Limiting alcohol use. Practicing safe sex. Taking low-dose aspirin every day. Taking vitamin and mineral supplements as recommended by your health care provider. What happens during an annual well check? The services and screenings done by your health care provider during your annual well check will depend on your age, overall health, lifestyle risk factors, and family history  of disease. Counseling  Your health care provider may ask you questions about your: Alcohol use. Tobacco use. Drug use. Emotional well-being. Home and relationship well-being. Sexual activity. Eating habits. History of falls. Memory and ability to understand (cognition). Work and work Statistician. Reproductive health. Screening  You may have the following tests or measurements: Height, weight, and BMI. Blood pressure. Lipid and cholesterol levels. These may be checked every 5 years, or more frequently if you are over 87 years old. Skin check. Lung cancer screening. You may have this screening every year starting at age 47 if you have a 30-pack-year history of smoking and currently smoke or have quit within the past 15 years. Fecal occult blood test (FOBT) of the stool. You may have this test every year starting at age 28. Flexible sigmoidoscopy or colonoscopy. You may have a sigmoidoscopy every 5 years or a colonoscopy every 10 years starting at age 32. Hepatitis C blood test. Hepatitis B blood test. Sexually transmitted disease (STD) testing. Diabetes screening. This is done by checking your blood sugar (glucose) after you have not eaten for a while (fasting). You may have this done every 1-3 years. Bone density scan. This is done to screen for osteoporosis. You may have this done starting at age 60. Mammogram. This may be done every 1-2 years. Talk to your health care provider about how often you should have regular mammograms. Talk with your health care provider about your test results, treatment options, and if necessary, the need for more tests. Vaccines  Your health care provider may recommend certain vaccines, such as: Influenza vaccine. This is recommended every year. Tetanus, diphtheria, and acellular pertussis (Tdap, Td) vaccine. You may need a Td  booster every 10 years. Zoster vaccine. You may need this after age 68. Pneumococcal 13-valent conjugate (PCV13) vaccine. One  dose is recommended after age 9. Pneumococcal polysaccharide (PPSV23) vaccine. One dose is recommended after age 32. Talk to your health care provider about which screenings and vaccines you need and how often you need them. This information is not intended to replace advice given to you by your health care provider. Make sure you discuss any questions you have with your health care provider. Document Released: 06/16/2015 Document Revised: 02/07/2016 Document Reviewed: 03/21/2015 Elsevier Interactive Patient Education  2017 [employer] Prevention in the Home Falls can cause injuries. They can happen to people of all ages. There are many things you can do to make your home safe and to help prevent falls. What can I do on the outside of my home? Regularly fix the edges of walkways and driveways and fix any cracks. Remove anything that might make you trip as you walk through a door, such as a raised step or threshold. Trim any bushes or trees on the path to your home. Use bright outdoor lighting. Clear any walking paths of anything that might make someone trip, such as rocks or tools. Regularly check to see if handrails are loose or broken. Make sure that both sides of any steps have handrails. Any raised decks and porches should have guardrails on the edges. Have any leaves, snow, or ice cleared regularly. Use sand or salt on walking paths during winter. Clean up any spills in your garage right away. This includes oil or grease spills. What can I do in the bathroom? Use night lights. Install grab bars by the toilet and in the tub and shower. Do not use towel bars as grab bars. Use non-skid mats or decals in the tub or shower. If you need to sit down in the shower, use a plastic, non-slip stool. Keep the floor dry. Clean up any water that spills on the floor as soon as it happens. Remove soap buildup in the tub or shower regularly. Attach bath mats securely with double-sided  non-slip rug tape. Do not have throw rugs and other things on the floor that can make you trip. What can I do in the bedroom? Use night lights. Make sure that you have a light by your bed that is easy to reach. Do not use any sheets or blankets that are too big for your bed. They should not hang down onto the floor. Have a firm chair that has side arms. You can use this for support while you get dressed. Do not have throw rugs and other things on the floor that can make you trip. What can I do in the kitchen? Clean up any spills right away. Avoid walking on wet floors. Keep items that you use a lot in easy-to-reach places. If you need to reach something above you, use a strong step stool that has a grab bar. Keep electrical cords out of the way. Do not use floor polish or wax that makes floors slippery. If you must use wax, use non-skid floor wax. Do not have throw rugs and other things on the floor that can make you trip. What can I do with my stairs? Do not leave any items on the stairs. Make sure that there are handrails on both sides of the stairs and use them. Fix handrails that are broken or loose. Make sure that handrails are as long as the stairways. Check any carpeting  to make sure that it is firmly attached to the stairs. Fix any carpet that is loose or worn. Avoid having throw rugs at the top or bottom of the stairs. If you do have throw rugs, attach them to the floor with carpet tape. Make sure that you have a light switch at the top of the stairs and the bottom of the stairs. If you do not have them, ask someone to add them for you. What else can I do to help prevent falls? Wear shoes that: Do not have high heels. Have rubber bottoms. Are comfortable and fit you well. Are closed at the toe. Do not wear sandals. If you use a stepladder: Make sure that it is fully opened. Do not climb a closed stepladder. Make sure that both sides of the stepladder are locked into place. Ask  someone to hold it for you, if possible. Clearly mark and make sure that you can see: Any grab bars or handrails. First and last steps. Where the edge of each step is. Use tools that help you move around (mobility aids) if they are needed. These include: Canes. Walkers. Scooters. Crutches. Turn on the lights when you go into a dark area. Replace any light bulbs as soon as they burn out. Set up your furniture so you have a clear path. Avoid moving your furniture around. If any of your floors are uneven, fix them. If there are any pets around you, be aware of where they are. Review your medicines with your doctor. Some medicines can make you feel dizzy. This can increase your chance of falling. Ask your doctor what other things that you can do to help prevent falls. This information is not intended to replace advice given to you by your health care provider. Make sure you discuss any questions you have with your health care provider. Document Released: 03/16/2009 Document Revised: 10/26/2015 Document Reviewed: 06/24/2014 Elsevier Interactive Patient Education  2017 [employer].

## 2021-02-19 NOTE — Progress Notes (Signed)
Subjective:   Olivia Bender is a 70 y.o. female who presents for Medicare Annual (Subsequent) preventive examination.  Review of Systems     Cardiac Risk Factors include: advanced age (>12men, >74 women);hypertension;dyslipidemia;obesity (BMI >30kg/m2)     Objective:    Today's Vitals   02/19/21 0852  BP: 130/68  Pulse: 79  Temp: (!) 97.3 F (36.3 C)  SpO2: 97%  Weight: 200 lb 9.6 oz (91 kg)   Body mass index is 37.9 kg/m.  Advanced Directives 02/19/2021 01/21/2020 06/08/2019 01/19/2019 07/31/2016 06/10/2016  Does Patient Have a Medical Advance Directive? Yes Yes No Yes No Yes  Type of Advance Directive Healthcare Power of Attorney Living will - Living will;Healthcare Power of [place] will;Healthcare Power of Attorney  Does patient want to make changes to medical advance directive? - - - No - Patient declined - -  Copy of [place] in Chart? No - copy requested - - No - copy requested - No - copy requested  Would patient like information on creating a medical advance directive? - - - - No - Patient declined -    Current Medications (verified) Outpatient Encounter Medications as of 02/19/2021  Medication Sig   albuterol (VENTOLIN HFA) 108 (90 Base) MCG/ACT inhaler    Alirocumab (PRALUENT) 150 MG/ML SOAJ Inject 150 mg into the skin every 14 (fourteen) days.   amLODipine (NORVASC) 2.5 MG tablet Take 1 tablet (2.5 mg total) by mouth daily.   aspirin 81 MG EC tablet Take 81 mg by mouth daily.   Azelastine HCl 137 MCG/SPRAY SOLN Place into the nose.   cyclobenzaprine (FLEXERIL) 10 MG tablet Take 10 mg by mouth 3 (three) times daily as needed.   ketotifen (ZADITOR) 0.025 % ophthalmic solution    levocetirizine (XYZAL) 5 MG tablet Take 5 mg by mouth every evening. Prn only   levothyroxine (SYNTHROID) 75 MCG tablet TAKE 1 TABLET BY MOUTH EVERY DAY   omeprazole (PRILOSEC OTC) 20 MG tablet Take 20 mg by mouth as needed.   sertraline (ZOLOFT) 50 MG tablet TAKE  1 TABLET BY MOUTH EVERY DAY   triamcinolone (NASACORT) 55 MCG/ACT AERO nasal inhaler Place 2 sprays into the nose daily.   No facility-administered encounter medications on file as of 02/19/2021.    Allergies (verified) Tetanus toxoids, Latex, Ezetimibe, and Statins   History: Past Medical History:  Diagnosis Date   Allergy    Anxiety    Arthritis    Constipation    Enlarged liver    Generalized headaches    since teenage years- usually can get away with tylenol   Hyperlipidemia    Osteopenia    Thyroid disease    hypothyroidism   Past Surgical History:  Procedure Laterality Date   arthroscopic left knee surgery     DILATION AND CURETTAGE OF UTERUS     monthlong period related. everything was ok   NASAL SINUS SURGERY  1986   Family History  Problem Relation Age of Onset   Stroke Mother        89. never smoker   Hypertension Mother    Other Father        killed in car wreck 2012   Prostate cancer Father    Obesity Sister    Kidney cancer Son    Heart disease Maternal Grandmother    Heart disease Maternal Grandfather    Social History   Socioeconomic History   Marital status: Married    Spouse name:  Not on file   Number of children: Not on file   Years of education: Not on file   Highest education level: Not on file  Occupational History   Occupation: Retired  Tobacco Use   Smoking status: Never   Smokeless tobacco: Never  Substance and Sexual Activity   Alcohol use: No   Drug use: Never   Sexual activity: Not on file  Other Topics Concern   Not on file  Social History Narrative   Married. 5 kids total between her and husband.  One son is a Mudlogger.      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        Hobbies: travel but hsuband ill   Social Determinants of Radio broadcast assistant Strain: Low Risk    Difficulty of Paying Living Expenses: Not hard at all  Food Insecurity: No Food  Insecurity   Worried About Charity fundraiser in the Last Year: Never true   Arboriculturist in the Last Year: Never true  Transportation Needs: No Transportation Needs   Lack of Transportation (Medical): No   Lack of Transportation (Non-Medical): No  Physical Activity: Inactive   Days of Exercise per Week: 0 days   Minutes of Exercise per Session: 0 min  Stress: Stress Concern Present   Feeling of Stress : To some extent  Social Connections: Moderately Integrated   Frequency of Communication with Friends and Family: More than three times a week   Frequency of Social Gatherings with Friends and Family: Three times a week   Attends Religious Services: More than 4 times per year   Active Member of Clubs or Organizations: No   Attends Archivist Meetings: Never   Marital Status: Married    Tobacco Counseling Counseling given: Not Answered   Clinical Intake:  Pre-visit preparation completed: Yes  Pain : No/denies pain     BMI - recorded: 37.9 Nutritional Status: BMI > 30  Obese Nutritional Risks: None Diabetes: No  How often do you need to have someone help you when you read instructions, pamphlets, or other written materials from your doctor or pharmacy?: 1 - Never  Diabetic?No  Interpreter Needed?: No  Information entered by :: Charlott Rakes, LPN   Activities of Daily Living In your present state of health, do you have any difficulty performing the following activities: 02/19/2021 03/14/2020  Hearing? Y N  Comment turnign tv up load slight loss noted -  Vision? N N  Difficulty concentrating or making decisions? N N  Walking or climbing stairs? N N  Dressing or bathing? N N  Doing errands, shopping? N N  Preparing Food and eating ? N -  Using the Toilet? N -  In the past six months, have you accidently leaked urine? Y -  Comment wears a pad at times -  Do you have problems with loss of bowel control? N -  Managing your Medications? N -  Managing  your Finances? N -  Housekeeping or managing your Housekeeping? N -  Some recent data might be hidden    Patient Care Team: Marin Olp, MD as PCP - General (Family Medicine) Garrel Ridgel, DPM as Consulting Physician (Podiatry) Debara Pickett, Nadean Corwin, MD as Consulting Physician (Cardiology) Harold Hedge, Darrick Grinder, MD as Consulting Physician (Allergy and Immunology) Edythe Clarity, [facility] (Pharmacist)  Indicate any recent Medical Services you may have received  from other than [facility] providers in the past year (date may be approximate).     Assessment:   This is a routine wellness examination for Essance.  Hearing/Vision screen Hearing Screening - Comments:: Pt has slight loss  Vision Screening - Comments:: Pt follows up with Dr Katy Fitch for annual eye exams   Dietary issues and exercise activities discussed: Current Exercise Habits: The patient does not participate in regular exercise at present   Goals Addressed             This Visit's Progress    Patient Stated       Lose weight        Depression Screen PHQ 2/9 Scores 02/19/2021 09/13/2020 03/14/2020 01/21/2020 12/10/2019 11/16/2018 05/18/2018  PHQ - 2 Score 0 3 0 0 0 0 0  PHQ- 9 Score - 12 0 - 0 1 1    Fall Risk Fall Risk  02/19/2021 09/13/2020 01/21/2020 12/10/2019 01/19/2019  Falls in the past year? 1 1 1 1  0  Comment - - - 2 in last 6 months -  Number falls in past yr: 1 1 1 1  0  Injury with Fall? 0 1 0 1 0  Comment - - just sore - -  Risk for fall due to : Impaired balance/gait - Impaired balance/gait;Impaired vision;History of fall(s) (No Data) -  Risk for fall due to: Comment - - pt stated problems with dizzyness triped -  Follow up Falls prevention discussed - Falls prevention discussed - Education provided    FALL RISK PREVENTION PERTAINING TO THE HOME:  Any stairs in or around the home? Yes  If so, are there any without handrails? No  Home free of loose throw rugs in walkways, pet beds, electrical cords, etc? Yes   Adequate lighting in your home to reduce risk of falls? Yes   ASSISTIVE DEVICES UTILIZED TO PREVENT FALLS:  Life alert? No  Use of a cane, walker or w/c? No  Grab bars in the bathroom?  no Shower chair or bench in shower? Yes  Elevated toilet seat or a handicapped toilet? No   TIMED UP AND GO:  Was the test performed? No .  Cognitive Function:     6CIT Screen 02/19/2021 01/21/2020  What Year? 0 points 0 points  What month? 0 points 0 points  What time? 0 points -  Count back from 20 0 points 0 points  Months in reverse 0 points 0 points  Repeat phrase 0 points 2 points  Total Score 0 -    Immunizations Immunization History  Administered Date(s) Administered   Fluad Quad(high Dose 65+) 03/14/2020, 02/19/2021   Influenza, High Dose Seasonal PF 04/20/2018, 03/04/2019   Influenza,inj,Quad PF,6+ Mos 05/22/2017   Influenza-Unspecified 04/17/2014   PFIZER(Purple Top)SARS-COV-2 Vaccination 12/17/2019, 01/06/2020, 06/08/2020   Pneumococcal Conjugate-13 07/28/2015   Pneumococcal Polysaccharide-23 07/14/2005, 11/21/2017   Zoster Recombinat (Shingrix) 04/20/2019, 09/15/2019   Zoster, Live 12/23/2011    TDAP: allergic   Flu Vaccine status: Completed at today's visit  Pneumococcal vaccine status: Up to date  Covid-19 vaccine status: Completed vaccines  Qualifies for Shingles Vaccine? Yes   Zostavax completed Yes   Shingrix Completed?: Yes  Screening Tests Health Maintenance  Topic Date Due   MAMMOGRAM  12/01/2021   TETANUS/TDAP  05/15/2022   COLONOSCOPY (Pts 45-56yrs Insurance coverage will need to be confirmed)  12/07/2024   INFLUENZA VACCINE  Completed   DEXA SCAN  Completed   Hepatitis C Screening  Completed   Zoster Vaccines-  Shingrix  Completed   HPV VACCINES  Aged Out   COVID-19 Vaccine  Discontinued    Health Maintenance  There are no preventive care reminders to display for this patient.   Colorectal cancer screening: Type of screening: Colonoscopy.  Completed 12/08/19. Repeat every 5 years  Mammogram status: Completed 12/01/20. Repeat every year  Bone Density status: Completed 01/26/21. Results reflect: Bone density results: OSTEOPENIA. Repeat every 2 years.   Additional Screening:  Hepatitis C Screening:  Completed 02/24/14  Vision Screening: Recommended annual ophthalmology exams for early detection of glaucoma and other disorders of the eye. Is the patient up to date with their annual eye exam?  Yes  Who is the provider or what is the name of the office in which the patient attends annual eye exams? Dr Katy Fitch  If pt is not established with a provider, would they like to be referred to a provider to establish care? No .   Dental Screening: Recommended annual dental exams for proper oral hygiene  Community Resource Referral / Chronic Care Management: CRR required this visit?  No   CCM required this visit?  No      Plan:     I have personally reviewed and noted the following in the patient's chart:   Medical and social history Use of alcohol, tobacco or illicit drugs  Current medications and supplements including opioid prescriptions.  Functional ability and status Nutritional status Physical activity Advanced directives List of other physicians Hospitalizations, surgeries, and ER visits in previous 12 months Vitals Screenings to include cognitive, depression, and falls Referrals and appointments  In addition, I have reviewed and discussed with patient certain preventive protocols, quality metrics, and best practice recommendations. A written personalized care plan for preventive services as well as general preventive health recommendations were provided to patient.     Willette Brace, LPN   [id number]   Nurse Notes: None

## 2021-03-09 NOTE — Progress Notes (Addendum)
Phone [phone] In person visit   Subjective:   Olivia Bender is a 70 y.o. year old very pleasant female patient who presents for/with See problem oriented charting Chief Complaint  Patient presents with   Hypertension   Gastroesophageal Reflux   Depression    This visit occurred during the SARS-CoV-2 public health emergency.  Safety protocols were in place, including screening questions prior to the visit, additional usage of staff PPE, and extensive cleaning of exam room while observing appropriate contact time as indicated for disinfecting solutions.   Past Medical History-  Patient Active Problem List   Diagnosis Date Noted   Essential hypertension 12/10/2019    Priority: 2.   Major depression in full remission ([place]) 05/18/2018    Priority: 2.   Asthma 11/21/2017    Priority: 2.   Generalized headaches     Priority: 2.   Fatty infiltration of liver 12/30/2014    Priority: 2.   Prediabetes 06/30/2014    Priority: 2.   Hypothyroidism 12/01/2010    Priority: 2.   Hyperlipidemia 12/01/2010    Priority: 2.   Osteopenia 12/01/2010    Priority: 2.   GE reflux 12/01/2010    Priority: 2.   Trochanteric bursitis of left hip 12/11/2015    Priority: 3.   Statin intolerance 06/30/2014    Priority: 3.   Left knee pain 06/30/2014    Priority: 3.   Allergic rhinitis 12/01/2010    Priority: 3.   Pulmonary nodule/lesion, solitary 02/21/2020   Aortic atherosclerosis ([place]) 12/11/2018    Medications- reviewed and updated Current Outpatient Medications  Medication Sig Dispense Refill   albuterol (VENTOLIN HFA) 108 (90 Base) MCG/ACT inhaler      Alirocumab (PRALUENT) 150 MG/ML SOAJ Inject 150 mg into the skin every 14 (fourteen) days. 2 mL 11   amLODipine (NORVASC) 2.5 MG tablet Take 1 tablet (2.5 mg total) by mouth daily. 90 tablet 3   aspirin 81 MG EC tablet Take 81 mg by mouth daily.     Azelastine HCl 137 MCG/SPRAY SOLN Place into the nose.     cyclobenzaprine  (FLEXERIL) 10 MG tablet Take 10 mg by mouth 3 (three) times daily as needed.     Ferrous Sulfate (IRON PO) Take by mouth. 3 times a week     levocetirizine (XYZAL) 5 MG tablet Take 5 mg by mouth every evening. Prn only     levothyroxine (SYNTHROID) 75 MCG tablet TAKE 1 TABLET BY MOUTH EVERY DAY 90 tablet 3   omeprazole (PRILOSEC OTC) 20 MG tablet Take 20 mg by mouth as needed.     sertraline (ZOLOFT) 50 MG tablet TAKE 1 TABLET BY MOUTH EVERY DAY 90 tablet 3   triamcinolone (NASACORT) 55 MCG/ACT AERO nasal inhaler Place 2 sprays into the nose daily.     VITAMIN D PO Take by mouth.     No current facility-administered medications for this visit.     Objective:  BP 126/76   Pulse 95   Temp 97.6 F (36.4 C) (Temporal)   Ht 5\' 1"  (1.549 m)   Wt 200 lb 6.4 oz (90.9 kg)   SpO2 99%   BMI 37.87 kg/m  Gen: NAD, resting comfortably CV: RRR no murmurs rubs or gallops Lungs: CTAB no crackles, wheeze, rhonchi Abdomen: soft/nontender/nondistended/normal bowel sounds. No rebound or guarding.  Ext: no edema- does have some varicose veins- tender where I touch Skin: warm, dry    Assessment and Plan   #Social Update- recovering  from a cold - husband still struggling (may call for visit and I would work him in). He is still struggling with depression issues.    # Tachycardia  S:HR often in the 90s prior to covid but after covid noted HR getting up over 100 (had this 2 years ago). For instance- if working around house can note HR up to 120- feels tired and short of breath. Has to lay down and that is helpful.    Of note patient had CT super D of chest with Dr. Lamonte Sakai on July 25, 2020-largely reassuring other than markings for coronary artery disease, aortic atherosclerosis, low volume examination as well as some atelectasis.  Shortness of breath started about 6 months ago- has seen Dr. Debara Pickett in that time but reported more of HR issues to him and not so much the shortness of breath.  A/P: The  high heart rate and it of itself is not overly worrisome to me but I do not like that it is associated with shortness of breath now-she is agreeable to seeing cardiology (rhytm evaluation primraily) . Does havey with coronary artery calcium/CAD though I suspect nonobstructive CAD and family history of CAD.  I do not think she needs repeat chest imaging has had CT of chest in February-that would be unless we find clots in the legs/DVT would likely do lung imaging.  # leg pain S:been getting a knot like sensation in her legs since getting covid- gets pain and feels area enlarge- feels like veins. Does not tolerate compression stockings- dos wear support hose.  Pain equal in both legs. Usually at rest- not moreso with walking A/P: I strongly doubt long-term or chronic DVT but given she has shortness of breath over the last 6 months plus this leg pain over the last 2 years-we are going to get a D-dimer to rule out clots-her sister has a history of DVT.  We discussed if positive does not mean she has clots but would need to evaluate further with ultrasound of the legs at a minimum. -Pain could be from varicosities and patient does not tolerate compression stockings-offered vascular surgery consult but patient would like to hold off for now   # Headaches S:headaches better on BP medicine. Stll gets right sided headache daily- no blurry vision. Still gets them daily-Usually short lived 10 mins- but can need advil migraine when longer- and lays down. Feels like behidn her eye- agrees to see her eye doctor -has had these headaches for past 1-2 years- did have prior headache but this is a new pattern.  A/P: With ongoing headaches that are new pattern over the last year to inpatient over age 2-we will get MRI of the brain to further evaluate and rule out mass - got a new mouthguard and feeling slightly better- did not help the headaches- was having sore jaw before now resolved  #hypertension S: medication:  Amlodipine 2.5 mg daily. - headaches better with improved BP control but not resolved BP Readings from Last 3 Encounters:  03/19/21 126/76  02/19/21 130/68  12/27/20 122/82  A/P: Blood pressure well controlled today-continue current medication   #hyperlipidemia-history of statin myalgia #Aortic atherosclerosis #CAD on imaging- CAC score of 7 per Dr. Debara Pickett notes S: Medication: Praluent 150 mg every 2 weeks, also takes asa 81 mg Lab Results  Component Value Date   CHOL 137 12/15/2020   HDL 43 12/15/2020   LDLCALC 64 12/15/2020   LDLDIRECT 184.0 11/21/2017   TRIG 177 (H) 12/15/2020  CHOLHDL 3.2 12/15/2020   A/P: Excellent control-continue current medication  Also at goal for aortic atherosclerosis/CAD-continue risk factor modification   # GERD S:Medication: Omeprazole 20 mg daily now. Still with some gassiness. Gas x helps some A/P: Reflux reasonably well controlled but is getting some gassiness.  Gas-X is certainly reasonable.  I will also check a B12 level with long-term use of PPI  # Depression S: Medication:zoloft 50 mg everyday  Depression screen [facility] 2/9 03/19/2021 02/19/2021 09/13/2020  Decreased Interest 0 0 2  Down, Depressed, Hopeless 1 0 1  PHQ - 2 Score 1 0 3  Altered sleeping 0 - 3  Tired, decreased energy 0 - 3  Change in appetite 0 - 3  Feeling bad or failure about yourself  0 - 0  Trouble concentrating 0 - 0  Moving slowly or fidgety/restless 0 - 0  Suicidal thoughts 0 - 0  PHQ-9 Score 1 - 12  Difficult doing work/chores Somewhat difficult - Not difficult at all  Some recent data might be hidden  A/P: Reasonable control/full remission-PHQ 2 not elevated at all-denies suicidal thoughts-continue current medication  #hypothyroidism S: compliant On thyroid medication-levothyroxine 75 mcg daily  Lab Results  Component Value Date   TSH 1.64 09/13/2020   A/P:  hopefully stable- update TSH today. Continue current meds for now    # Hyperglycemia/insulin  resistance/prediabetes-previously noted by Dr. Nolene Ebbs CBGs have been elevated S:  Medication: None Exercise and diet-A1c's have not been elevated with weight loss-congratulated patient on her efforts- doing phenomenal!    Lab Results  Component Value Date   HGBA1C 5.4 12/10/2019   HGBA1C 5.5 05/18/2018   HGBA1C 5.6 05/22/2017   A/P: doing well overall- update a1c  #Osteopenia/low bone density-bone density improved when checked January 26, 2021-she is currently on vitamin D   -actonel for 9 years- stopped 9 years ago. Technically could call this osteoporosis since previously on Actonel-but patient's bone density is actually in normal range and improving despite not being on medicine!   Recommended follow up: No follow-ups on file. Future Appointments  Date Time Provider [place]  04/03/2021  3:30 PM [facility] CCM PHARMACIST [facility]  02/28/2022  8:45 AM [facility] HEALTH COACH [facility]    Lab/Order associations:   ICD-10-CM   1. Essential hypertension  I10 CBC with Differential/Platelet    Comprehensive metabolic panel    2. Gastroesophageal reflux disease without esophagitis  K21.9     3. Major depressive disorder with single episode, in full remission ([place])  F32.5     4. Hypothyroidism, unspecified type  E03.9 TSH    5. High risk medication use  Z79.899 Vitamin B12    6. Hyperglycemia  R73.9 Hemoglobin A1c    7. Shortness of breath  R06.02 Ambulatory referral to Cardiology    D-Dimer, Quantitative    8. Coronary artery disease due to lipid rich plaque  I25.10 Ambulatory referral to Cardiology   I25.83     9. Pain in both lower extremities  M79.604 D-Dimer, Quantitative   M79.605     10. Daily headache  R51.9 MR Brain Wo Contrast      No orders of the defined types were placed in this encounter.  I,Jada Bradford,acting as a scribe for Garret Reddish, MD.,have documented all relevant documentation on the behalf of Garret Reddish, MD,as directed by   Garret Reddish, MD while in the presence of Garret Reddish, MD.   I, Garret Reddish, MD, have reviewed all documentation for this visit.  The documentation on 03/19/21 for the exam, diagnosis, procedures, and orders are all accurate and complete.   Return precautions advised.  Garret Reddish, MD

## 2021-03-19 ENCOUNTER — Ambulatory Visit (INDEPENDENT_AMBULATORY_CARE_PROVIDER_SITE_OTHER): Payer: Medicare Other | Admitting: Family Medicine

## 2021-03-19 ENCOUNTER — Other Ambulatory Visit: Payer: Self-pay

## 2021-03-19 ENCOUNTER — Encounter: Payer: Self-pay | Admitting: Family Medicine

## 2021-03-19 VITALS — BP 126/76 | HR 95 | Temp 97.6°F | Ht 61.0 in | Wt 200.4 lb

## 2021-03-19 DIAGNOSIS — I2510 Atherosclerotic heart disease of native coronary artery without angina pectoris: Secondary | ICD-10-CM

## 2021-03-19 DIAGNOSIS — I10 Essential (primary) hypertension: Secondary | ICD-10-CM

## 2021-03-19 DIAGNOSIS — R0602 Shortness of breath: Secondary | ICD-10-CM | POA: Diagnosis not present

## 2021-03-19 DIAGNOSIS — M79605 Pain in left leg: Secondary | ICD-10-CM | POA: Diagnosis not present

## 2021-03-19 DIAGNOSIS — R519 Headache, unspecified: Secondary | ICD-10-CM | POA: Diagnosis not present

## 2021-03-19 DIAGNOSIS — R739 Hyperglycemia, unspecified: Secondary | ICD-10-CM

## 2021-03-19 DIAGNOSIS — I2583 Coronary atherosclerosis due to lipid rich plaque: Secondary | ICD-10-CM

## 2021-03-19 DIAGNOSIS — E039 Hypothyroidism, unspecified: Secondary | ICD-10-CM | POA: Diagnosis not present

## 2021-03-19 DIAGNOSIS — Z79899 Other long term (current) drug therapy: Secondary | ICD-10-CM | POA: Diagnosis not present

## 2021-03-19 DIAGNOSIS — M79604 Pain in right leg: Secondary | ICD-10-CM

## 2021-03-19 DIAGNOSIS — F325 Major depressive disorder, single episode, in full remission: Secondary | ICD-10-CM | POA: Diagnosis not present

## 2021-03-19 DIAGNOSIS — K219 Gastro-esophageal reflux disease without esophagitis: Secondary | ICD-10-CM | POA: Diagnosis not present

## 2021-03-19 LAB — TSH: TSH: 2.72 u[IU]/mL (ref 0.35–5.50)

## 2021-03-19 LAB — CBC WITH DIFFERENTIAL/PLATELET
Basophils Absolute: 0 10*3/uL (ref 0.0–0.1)
Basophils Relative: 0.6 % (ref 0.0–3.0)
Eosinophils Absolute: 0.2 10*3/uL (ref 0.0–0.7)
Eosinophils Relative: 2.6 % (ref 0.0–5.0)
HCT: 36.1 % (ref 36.0–46.0)
Hemoglobin: 12.1 g/dL (ref 12.0–15.0)
Lymphocytes Relative: 21.7 % (ref 12.0–46.0)
Lymphs Abs: 1.4 10*3/uL (ref 0.7–4.0)
MCHC: 33.6 g/dL (ref 30.0–36.0)
MCV: 94.3 fl (ref 78.0–100.0)
Monocytes Absolute: 0.6 10*3/uL (ref 0.1–1.0)
Monocytes Relative: 9.1 % (ref 3.0–12.0)
Neutro Abs: 4.2 10*3/uL (ref 1.4–7.7)
Neutrophils Relative %: 66 % (ref 43.0–77.0)
Platelets: 225 10*3/uL (ref 150.0–400.0)
RBC: 3.83 Mil/uL — ABNORMAL LOW (ref 3.87–5.11)
RDW: 13.7 % (ref 11.5–15.5)
WBC: 6.4 10*3/uL (ref 4.0–10.5)

## 2021-03-19 LAB — COMPREHENSIVE METABOLIC PANEL
ALT: 23 U/L (ref 0–35)
AST: 28 U/L (ref 0–37)
Albumin: 4.2 g/dL (ref 3.5–5.2)
Alkaline Phosphatase: 67 U/L (ref 39–117)
BUN: 13 mg/dL (ref 6–23)
CO2: 26 mEq/L (ref 19–32)
Calcium: 9 mg/dL (ref 8.4–10.5)
Chloride: 106 mEq/L (ref 96–112)
Creatinine, Ser: 0.79 mg/dL (ref 0.40–1.20)
GFR: 75.55 mL/min (ref >60.00)
Glucose, Bld: 113 mg/dL — ABNORMAL HIGH (ref 70–99)
Potassium: 4 mEq/L (ref 3.5–5.1)
Sodium: 140 mEq/L (ref 135–145)
Total Bilirubin: 0.4 mg/dL (ref 0.2–1.2)
Total Protein: 6.9 g/dL (ref 6.0–8.3)

## 2021-03-19 LAB — VITAMIN B12: Vitamin B-12: 493 pg/mL (ref 211–911)

## 2021-03-19 LAB — HEMOGLOBIN A1C: Hgb A1c MFr Bld: 5.8 % (ref 4.6–6.5)

## 2021-03-19 NOTE — Patient Instructions (Addendum)
We will call you within two weeks about your referral to cardiology. If you do not hear within 2 weeks, give [place] a call.   Please schedule an appointment to see your eye doctor.  Please stop by lab before you go If you have mychart- we will send your results within 3 business days of [place] receiving them.  If you do not have mychart- we will call you about results within 5 business days of [place] receiving them.  *please also note that you will see labs on mychart as soon as they post. I will later go in and write notes on them- will say "notes from Dr. Yong Channel"  Recommended follow up: Return in about 6 months (around 09/17/2021) for follow up or sooner if needed.

## 2021-03-20 ENCOUNTER — Other Ambulatory Visit: Payer: Self-pay | Admitting: Family Medicine

## 2021-03-20 DIAGNOSIS — M7989 Other specified soft tissue disorders: Secondary | ICD-10-CM

## 2021-03-20 DIAGNOSIS — R7989 Other specified abnormal findings of blood chemistry: Secondary | ICD-10-CM

## 2021-03-20 LAB — D-DIMER, QUANTITATIVE: D-Dimer, Quant: 0.51 mcg/mL FEU — ABNORMAL HIGH (ref <0.50)

## 2021-03-22 ENCOUNTER — Other Ambulatory Visit: Payer: Self-pay

## 2021-03-22 ENCOUNTER — Other Ambulatory Visit: Payer: Self-pay | Admitting: Medical

## 2021-03-22 ENCOUNTER — Ambulatory Visit (INDEPENDENT_AMBULATORY_CARE_PROVIDER_SITE_OTHER): Payer: Medicare Other

## 2021-03-22 ENCOUNTER — Ambulatory Visit (HOSPITAL_COMMUNITY)
Admission: RE | Admit: 2021-03-22 | Discharge: 2021-03-22 | Disposition: A | Discharge status: Home / Self Care | Payer: Medicare Other | Source: Ambulatory Visit | Attending: Family Medicine | Admitting: Family Medicine

## 2021-03-22 DIAGNOSIS — R002 Palpitations: Secondary | ICD-10-CM

## 2021-03-22 DIAGNOSIS — R7989 Other specified abnormal findings of blood chemistry: Secondary | ICD-10-CM

## 2021-03-22 DIAGNOSIS — R000 Tachycardia, unspecified: Secondary | ICD-10-CM

## 2021-03-22 DIAGNOSIS — R0602 Shortness of breath: Secondary | ICD-10-CM

## 2021-03-22 DIAGNOSIS — M7989 Other specified soft tissue disorders: Secondary | ICD-10-CM

## 2021-03-22 NOTE — Progress Notes (Signed)
   Patient seen by PCP recently with complaints of palpitations. Will place order for 2 week zio patch to evaluate symptoms. Patient to see Dr. Harrell Gave afterwards to discuss results.   Olivia Butts, PA-C 03/22/21; 12:35 PM

## 2021-03-22 NOTE — Progress Notes (Unsigned)
Patient enrolled for [employer] to mail a 14 day ZIO AT monitor to her address on file.

## 2021-03-22 NOTE — Progress Notes (Signed)
Per Chart review, pt is scheduled to have Ultrasound this afternoon.

## 2021-03-25 DIAGNOSIS — R0602 Shortness of breath: Secondary | ICD-10-CM

## 2021-03-25 DIAGNOSIS — R002 Palpitations: Secondary | ICD-10-CM

## 2021-03-25 DIAGNOSIS — R000 Tachycardia, unspecified: Secondary | ICD-10-CM | POA: Diagnosis not present

## 2021-03-27 DIAGNOSIS — R000 Tachycardia, unspecified: Secondary | ICD-10-CM | POA: Diagnosis not present

## 2021-03-27 DIAGNOSIS — R002 Palpitations: Secondary | ICD-10-CM | POA: Diagnosis not present

## 2021-03-27 DIAGNOSIS — R0602 Shortness of breath: Secondary | ICD-10-CM | POA: Diagnosis not present

## 2021-04-03 ENCOUNTER — Ambulatory Visit (INDEPENDENT_AMBULATORY_CARE_PROVIDER_SITE_OTHER): Payer: Medicare Other | Admitting: Pharmacist

## 2021-04-03 DIAGNOSIS — E785 Hyperlipidemia, unspecified: Secondary | ICD-10-CM

## 2021-04-03 DIAGNOSIS — I10 Essential (primary) hypertension: Secondary | ICD-10-CM

## 2021-04-03 NOTE — Progress Notes (Signed)
Chronic Care Management Pharmacy Note  04/05/2021 Name:  Olivia Bender MRN:  [id number] DOB:  06-15-1950  Subjective: Olivia Bender is an 70 y.o. year old female who is a primary patient of Hunter, Brayton Mars, MD.  The CCM team was consulted for assistance with disease management and care coordination needs.    Engaged with patient by telephone for follow up visit in response to provider referral for pharmacy case management and/or care coordination services.   Consent to Services:  The patient was given the following information about Chronic Care Management services today, agreed to services, and gave verbal consent: 1. CCM service includes personalized support from designated clinical staff supervised by the primary care provider, including individualized plan of care and coordination with other care providers 2. 24/7 contact phone numbers for assistance for urgent and routine care needs. 3. Service will only be billed when office clinical staff spend 20 minutes or more in a month to coordinate care. 4. Only one practitioner may furnish and bill the service in a calendar month. 5.The patient may stop CCM services at any time (effective at the end of the month) by phone call to the office staff. 6. The patient will be responsible for cost sharing (co-pay) of up to 20% of the service fee (after annual deductible is met). Patient agreed to services and consent obtained.  Patient Care Team: Marin Olp, MD as PCP - General (Family Medicine) Garrel Ridgel, DPM as Consulting Physician (Podiatry) Debara Pickett, Nadean Corwin, MD as Consulting Physician (Cardiology) Harold Hedge, Darrick Grinder, MD as Consulting Physician (Allergy and Immunology) Edythe Clarity, [facility] (Pharmacist)  Recent office visits: 03/19/21 Yong Channel) - no blood clots in legs, she complains of palpitations 11/21/20 Yong Channel) - started on Amlodipine 2.82m daily due to multiple elevated readings.  Hospital visits: None in previous 6  months  Objective:  Lab Results  Component Value Date   CREATININE 0.79 03/19/2021   CREATININE 0.80 09/13/2020   CREATININE 0.71 12/10/2019    Lab Results  Component Value Date   HGBA1C 5.8 03/19/2021   Last diabetic Eye exam: No results found for: HMDIABEYEEXA  Last diabetic Foot exam: No results found for: HMDIABFOOTEX      Component Value Date/Time   CHOL 137 12/15/2020 0914   TRIG 177 (H) 12/15/2020 0914   HDL 43 12/15/2020 0914   CHOLHDL 3.2 12/15/2020 0914   CHOLHDL 4.2 12/10/2019 1628   VLDL 21.4 05/18/2018 0936   LDLCALC 64 12/15/2020 0914   LDLCALC 96 12/10/2019 1628   LDLDIRECT 184.0 11/21/2017 1106    Hepatic Function Latest Ref Rng & Units 03/19/2021 12/15/2020 09/13/2020  Total Protein 6.0 - 8.3 g/dL 6.9 7.1 6.8  Albumin 3.5 - 5.2 g/dL 4.2 4.4 3.8  AST 0 - 37 U/L 28 38 26  ALT 0 - 35 U/L _0 Alk Phosphatase 39 - 117 U/L 67 78 58  Total Bilirubin 0.2 - 1.2 mg/dL 0.4 0.5 0.4  Bilirubin, Direct 0.00 - 0.40 mg/dL - 0.14 -    Lab Results  Component Value Date/Time   TSH 2.72 03/19/2021 10:35 AM   TSH 1.64 09/13/2020 10:22 AM    CBC Latest Ref Rng & Units 03/19/2021 09/13/2020 03/14/2020  WBC 4.0 - 10.5 K/uL 6.4 5.6 8.3  Hemoglobin 12.0 - 15.0 g/dL 12.1 11.8(L) 12.4  Hematocrit 36.0 - 46.0 % 36.1 35.3(L) 37.1  Platelets 150.0 - 400.0 K/uL 225.0 227.0 268    Lab Results  Component  Value Date/Time   VD25OH 43 05/22/2017 10:45 AM   VD25OH 31 12/11/2015 01:18 PM    Clinical ASCVD:  The 10-year ASCVD risk score (Arnett DK, et al., 2019) is: 11.5%   Values used to calculate the score:     Age: 79 years     Sex: Female     Is Non-Hispanic African American: No     Diabetic: No     Tobacco smoker: No     Systolic Blood Pressure: 833 mmHg     Is BP treated: Yes     HDL Cholesterol: 43 mg/dL     Total Cholesterol: 137 mg/dL    Social History   Tobacco Use  Smoking Status Never  Smokeless Tobacco Never   BP Readings from Last 3 Encounters:   03/19/21 126/76  02/19/21 130/68  12/27/20 122/82   Pulse Readings from Last 3 Encounters:  03/19/21 95  02/19/21 79  12/27/20 77   Wt Readings from Last 3 Encounters:  03/19/21 200 lb 6.4 oz (90.9 kg)  02/19/21 200 lb 9.6 oz (91 kg)  12/27/20 204 lb 3.2 oz (92.6 kg)    Assessment: Review of patient past medical history, allergies, medications, health status, including review of consultants reports, laboratory and other test data, was performed as part of comprehensive evaluation and provision of chronic care management services.   SDOH:  (Social Determinants of Health) assessments and interventions performed: Yes   CCM Care Plan  Allergies  Allergen Reactions   Tetanus Toxoids Anaphylaxis   Latex Rash   Ezetimibe Other (See Comments)    Myalgia   Statins Other (See Comments)    Lipitor, Crestor, Livalo -myalgias    Medications Reviewed Today     Reviewed by Edythe Clarity, [facility] (Pharmacist) on 04/05/21 at 0846  Med List Status: <None>   Medication Order Taking? Sig Documenting Provider Last Dose Status Informant  albuterol (VENTOLIN HFA) 108 (90 Base) MCG/ACT inhaler [id number] Yes  [provider] Taking Active            Med Note Willette Brace   Fri Jan 21, 2020 11:30 AM) As needed  Alirocumab (PRALUENT) 150 MG/ML SOAJ [id number] Yes Inject 150 mg into the skin every 14 (fourteen) days. Pixie Casino, MD Taking Active   amLODipine (NORVASC) 2.5 MG tablet [id number] Yes Take 1 tablet (2.5 mg total) by mouth daily. Marin Olp, MD Taking Active   aspirin 81 MG EC tablet [id number] Yes Take 81 mg by mouth daily. [provider] Taking Active   Azelastine HCl 137 MCG/SPRAY SOLN [id number] Yes Place into the nose. [provider] Taking Active            Med Note Willette Brace   Fri Jan 21, 2020 11:30 AM) As needed   cyclobenzaprine (FLEXERIL) 10 MG tablet [id number] Yes Take 10 mg by mouth 3 (three) times daily as needed.  [provider] Taking Active            Med Note Willette Brace   Fri Jan 21, 2020 11:31 AM) As needed  Ferrous Sulfate (IRON PO) [id number] Yes Take by mouth. 3 times a week [provider] Taking Active   levocetirizine (XYZAL) 5 MG tablet [id number] Yes Take 5 mg by mouth every evening. Prn only [provider] Taking Active   levothyroxine (SYNTHROID) 75 MCG tablet [id number] Yes TAKE 1 TABLET BY MOUTH EVERY DAY Marin Olp, MD Taking Active   omeprazole (PRILOSEC  OTC) 20 MG tablet [id number] Yes Take 20 mg by mouth as needed. [provider] Taking Active   sertraline (ZOLOFT) 50 MG tablet [id number] Yes TAKE 1 TABLET BY MOUTH EVERY DAY Marin Olp, MD Taking Active   triamcinolone (NASACORT) 55 MCG/ACT AERO nasal inhaler [id number] Yes Place 2 sprays into the nose daily. [provider] Taking Active            Med Note Willette Brace   Fri Jan 21, 2020 11:31 AM) As needed   VITAMIN D PO [id number] Yes Take by mouth. [provider] Taking Active             Patient Active Problem List   Diagnosis Date Noted   Pulmonary nodule/lesion, solitary 02/21/2020   Essential hypertension 12/10/2019   Aortic atherosclerosis ([place]) 12/11/2018   Major depression in full remission ([place]) 05/18/2018   Asthma 11/21/2017   Generalized headaches    Trochanteric bursitis of left hip 12/11/2015   Fatty infiltration of liver 12/30/2014   Statin intolerance 06/30/2014   Left knee pain 06/30/2014   Prediabetes 06/30/2014   Hypothyroidism 12/01/2010   Hyperlipidemia 12/01/2010   Osteopenia 12/01/2010   Allergic rhinitis 12/01/2010   GE reflux 12/01/2010    Immunization History  Administered Date(s) Administered   Fluad Quad(high Dose 65+) 03/14/2020, 02/19/2021   Influenza, High Dose Seasonal PF 04/20/2018, 03/04/2019   Influenza,inj,Quad PF,6+ Mos 05/22/2017   Influenza-Unspecified 04/17/2014   PFIZER(Purple Top)SARS-COV-2  Vaccination 12/17/2019, 01/06/2020, 06/08/2020   Pneumococcal Conjugate-13 07/28/2015   Pneumococcal Polysaccharide-23 07/14/2005, 11/21/2017   Zoster Recombinat (Shingrix) 04/20/2019, 09/15/2019   Zoster, Live 12/23/2011    Conditions to be addressed/monitored: Asthma, HTN, Aortic Atherosclerosis, Osteopenia, GERD, Hypothyroidism, MDD, Statin intolerance, Fatty liver  Care Plan : [place]  Updates made by Edythe Clarity, RPH since 04/05/2021 12:00 AM     Problem: Asthma, HTN, Aortic Atherosclerosis, Osteopenia, GERD, Hypothyroidism, MDD, Statin intolerance, Fatty liver   Priority: High     Long-Range Goal: Disease Management   Start Date: 09/26/2020  Expected End Date: 09/26/2021  Recent Progress: On track  Priority: High  Note:   Current Barriers:  Ongoing GERD Symptoms, needing to come up with excercise goals  Pharmacist Clinical Goal(s):  Patient will contact provider office for questions/concerns as evidenced notation of same in electronic health record through collaboration with PharmD and provider.   Interventions: 1:1 collaboration with Marin Olp, MD regarding development and update of comprehensive plan of care as evidenced by provider attestation and co-signature Inter-disciplinary care team collaboration (see longitudinal plan of care) Comprehensive medication review performed; medication list updated in electronic medical record No med changes at this time  Hypertension (BP goal <130/80) -Controlled based on last three OV BP readings  -Current treatment: Amlodipine 2.75m daily -Medications previously tried: no medications, some previous weight loss seemed to have improved BP management.  -Current home readings: none provided. Does have BP cuff. -Current dietary patterns: trying not to snack - cutting down on sweets. Denies routine intake of fruits/vegetables. Current exercise habits: trying to walk but hip had hurt so had quit and did not  revisit.   -Denies hypotensive/hypertensive symptoms -Counseled to monitor BP at home 1-2x every 2 weeks, document, and provide log at future appointments -Recommended to continue current medication   Update 12/26/20 Started Amlodipine 2.583mdaily about one month ago. Home BP readings: 119/76 today Swelling? Denies any swelling She reports she is riding the stationary bike about 4 times  per week for 20 minutes!! Congratulated her for this huge step in lifestyle modification.  Continue current meds for now - BP monitoring to continue and regular follow up  Update 04/03/21 BP has been normal lately. She did have some swelling - Ddimer was negative for clotting.  However she is currently wearing a heart monitor from cardiology. She was having dizziness with unknown origin. Recommend continue current meds for now - continues positive lifestyle changes. FU after heart monitor  Depression/Anxiety (Goal: minimize symptoms) -Controlled  -Feels sertraline has helped her a lot, did have concern over husbands health for a while. Feels there are some symptoms of depression but is not wanting to further adjust medication or seek additional support. -Current treatment: Sertraline 50 mg once daily  -PHQ9: not performed today, last 12 on 09/13/2020 -Educated on Benefits of medication for symptom control -Recommended to continue current medication  Hyperlipidemia: (LDL goal < 70) -Likely controlled, is needed updated labs to further assess. Reports to have just given first Praluent injection last week. No cost concerns due to receiving assistance through [employer].  -HTN, Aortic Atherosclerosis  -Statin intolerance -Current treatment: Praluent 150 mg once every 14 days (08/29/2020 - Dr Debara Pickett) -Educated on Cholesterol goals;  -Recommended to continue current medication  -Plan to walk 2-3x/week  Update 04/03/21 Still has grant for [employer], which expires in March 2023. LDL remains  controlled - no concerns at this time. Continue current meds, will help reapply and follow up on [employer] grant in march 2023. FU appt scheduled with CMA for that time.  Asthma (Goal: control symptoms and prevent exacerbations) -Controlled -Current treatment  Albuterol 90 mcg/act PRN -Exacerbations requiring treatment in last 6 months: 0 -Patient denies consistent use of maintenance inhaler. Not needed.  -Frequency of rescue inhaler use: has not used  -Counseled on When to use rescue inhaler -Recommended to continue current medication  Hypothyroidism (Goal: maintain normal TSH) -Controlled  -Admits to missed dose once every 1-2 weeks - will start focusing on taking first thing in AM consistently  -Current treatment  Levothyroxine 75 mcg once daily -Recommended to continue current medication  GERD (minimize symptoms) -Not ideally controlled -Symptoms 1-2x/week. Large amounts of coffee and does have a glass of lemonade once daily. Has given up on omeprazole due to ongoing symptoms and just uses tums. -Regimen: Omeprazole 20 mg once daily OTC (held) Reviewed reflux triggers at length Will check on dietary progress next month  Update 12/26/20 Working on triggers? Yes Symptoms? Comes and goes Taking Omeprazole? Yes she is taking it every night after dinner Recommended she switch to about 30-60 minutes before dinner for optimal efficacy.  Patient agrees to try this and we will follow up to see if this helps her symptoms.  Patient Goals/Self-Care Activities Patient will:  - take medications as prescribed target a minimum of 150 minutes of moderate intensity exercise weekly engage in dietary modifications by reducing coffee and lemonade intake. goal exercise is 2-3x/week  Follow Up Plan: 3 month PharmD fu call Medication Assistance: None required.  Patient affirms current coverage meets needs.  [id number] [place] [id number]: [phone]: [phone] Follow Up:  Patient agrees to Care Plan and Follow-up.          Future Appointments  Date Time Provider DMutual 04/17/2021  1:30 PM GI-315 MR 3 [id number] GI-315 [address] [facility]  04/25/2021 11:20 AM  Buford Dresser, MD [facility]  02/28/2022  8:45 AM [facility] HEALTH COACH [facility] [place], PharmD Clinical Pharmacist [phone]

## 2021-04-05 NOTE — Patient Instructions (Addendum)
Visit Information   Goals Addressed             This Visit's Progress    Track and Manage My Blood Pressure-Hypertension   On track    Timeframe:  Long-Range Goal Priority:  High Follow Up Date 12/2020  - check blood pressure weekly    Why is this important?   You won't feel high blood pressure, but it can still hurt your blood vessels.  High blood pressure can cause heart or kidney problems. It can also cause a stroke.  Making lifestyle changes like losing a little weight or eating less salt will help.  Checking your blood pressure at home and at different times of the day can help to control blood pressure.  If the doctor prescribes medicine remember to take it the way the doctor ordered.  Call the office if you cannot afford the medicine or if there are questions about it.     Notes:   12/26/20 - Amlodipine 2.5mg  recently added - Bps have improved       Patient Care Plan: [place] Plan     Problem Identified: Asthma, HTN, Aortic Atherosclerosis, Osteopenia, GERD, Hypothyroidism, MDD, Statin intolerance, Fatty liver   Priority: High     Long-Range Goal: Disease Management   Start Date: 09/26/2020  Expected End Date: 09/26/2021  Recent Progress: On track  Priority: High  Note:   Current Barriers:  Ongoing GERD Symptoms, needing to come up with excercise goals  Pharmacist Clinical Goal(s):  Patient will contact provider office for questions/concerns as evidenced notation of same in electronic health record through collaboration with PharmD and provider.   Interventions: 1:1 collaboration with Marin Olp, MD regarding development and update of comprehensive plan of care as evidenced by provider attestation and co-signature Inter-disciplinary care team collaboration (see longitudinal plan of care) Comprehensive medication review performed; medication list updated in electronic medical record No med changes at this time  Hypertension (BP goal  <130/80) -Controlled based on last three OV BP readings  -Current treatment: Amlodipine 2.5mg  daily -Medications previously tried: no medications, some previous weight loss seemed to have improved BP management.  -Current home readings: none provided. Does have BP cuff. -Current dietary patterns: trying not to snack - cutting down on sweets. Denies routine intake of fruits/vegetables. Current exercise habits: trying to walk but hip had hurt so had quit and did not revisit.   -Denies hypotensive/hypertensive symptoms -Counseled to monitor BP at home 1-2x every 2 weeks, document, and provide log at future appointments -Recommended to continue current medication   Update 12/26/20 Started Amlodipine 2.5mg  daily about one month ago. Home BP readings: 119/76 today Swelling? Denies any swelling She reports she is riding the stationary bike about 4 times per week for 20 minutes!! Congratulated her for this huge step in lifestyle modification.  Continue current meds for now - BP monitoring to continue and regular follow up  Update 04/03/21 BP has been normal lately. She did have some swelling - Ddimer was negative for clotting.  However she is currently wearing a heart monitor from cardiology. She was having dizziness with unknown origin. Recommend continue current meds for now - continues positive lifestyle changes. FU after heart monitor  Depression/Anxiety (Goal: minimize symptoms) -Controlled  -Feels sertraline has helped her a lot, did have concern over husbands health for a while. Feels there are some symptoms of depression but is not wanting to further adjust medication or seek additional support. -Current treatment: Sertraline 50 mg  once daily  -PHQ9: not performed today, last 12 on 09/13/2020 -Educated on Benefits of medication for symptom control -Recommended to continue current medication  Hyperlipidemia: (LDL goal < 70) -Likely controlled, is needed updated labs to further  assess. Reports to have just given first Praluent injection last week. No cost concerns due to receiving assistance through [employer].  -HTN, Aortic Atherosclerosis  -Statin intolerance -Current treatment: Praluent 150 mg once every 14 days (08/29/2020 - Dr Debara Pickett) -Educated on Cholesterol goals;  -Recommended to continue current medication  -Plan to walk 2-3x/week  Update 04/03/21 Still has grant for [employer], which expires in March 2023. LDL remains controlled - no concerns at this time. Continue current meds, will help reapply and follow up on [employer] grant in march 2023. FU appt scheduled with CMA for that time.  Asthma (Goal: control symptoms and prevent exacerbations) -Controlled -Current treatment  Albuterol 90 mcg/act PRN -Exacerbations requiring treatment in last 6 months: 0 -Patient denies consistent use of maintenance inhaler. Not needed.  -Frequency of rescue inhaler use: has not used  -Counseled on When to use rescue inhaler -Recommended to continue current medication  Hypothyroidism (Goal: maintain normal TSH) -Controlled  -Admits to missed dose once every 1-2 weeks - will start focusing on taking first thing in AM consistently  -Current treatment  Levothyroxine 75 mcg once daily -Recommended to continue current medication  GERD (minimize symptoms) -Not ideally controlled -Symptoms 1-2x/week. Large amounts of coffee and does have a glass of lemonade once daily. Has given up on omeprazole due to ongoing symptoms and just uses tums. -Regimen: Omeprazole 20 mg once daily OTC (held) Reviewed reflux triggers at length Will check on dietary progress next month  Update 12/26/20 Working on triggers? Yes Symptoms? Comes and goes Taking Omeprazole? Yes she is taking it every night after dinner Recommended she switch to about 30-60 minutes before dinner for optimal efficacy.  Patient agrees to try this and we will follow up to see if this helps her  symptoms.  Patient Goals/Self-Care Activities Patient will:  - take medications as prescribed target a minimum of 150 minutes of moderate intensity exercise weekly engage in dietary modifications by reducing coffee and lemonade intake. goal exercise is 2-3x/week  Follow Up Plan: 3 month PharmD fu call Medication Assistance: None required.  Patient affirms current coverage meets needs.  [facility]'s [facility] pharmacy [facility]:  [id number] [place] [place] [address] Phone: [phone] Fax: [phone]  Follow Up:  Patient agrees to Care Plan and Follow-up.         Patient verbalizes understanding of instructions provided today and agrees to view in [place].  Telephone follow up appointment with pharmacy team member scheduled for: 6 months  Edythe Clarity, [place]

## 2021-04-12 ENCOUNTER — Other Ambulatory Visit: Payer: Medicare Other

## 2021-04-17 ENCOUNTER — Ambulatory Visit
Admission: RE | Admit: 2021-04-17 | Discharge: 2021-04-17 | Disposition: A | Discharge status: Home / Self Care | Payer: Medicare Other | Source: Ambulatory Visit | Attending: Family Medicine | Admitting: Family Medicine

## 2021-04-17 DIAGNOSIS — R519 Headache, unspecified: Secondary | ICD-10-CM

## 2021-04-17 DIAGNOSIS — I6782 Cerebral ischemia: Secondary | ICD-10-CM | POA: Diagnosis not present

## 2021-04-25 ENCOUNTER — Ambulatory Visit (INDEPENDENT_AMBULATORY_CARE_PROVIDER_SITE_OTHER): Payer: Medicare Other | Admitting: Cardiology

## 2021-04-25 ENCOUNTER — Other Ambulatory Visit: Payer: Self-pay

## 2021-04-25 ENCOUNTER — Encounter (HOSPITAL_BASED_OUTPATIENT_CLINIC_OR_DEPARTMENT_OTHER): Payer: Self-pay | Admitting: Cardiology

## 2021-04-25 VITALS — BP 132/80 | HR 92 | Ht 61.0 in | Wt 200.6 lb

## 2021-04-25 DIAGNOSIS — Z712 Person consulting for explanation of examination or test findings: Secondary | ICD-10-CM | POA: Diagnosis not present

## 2021-04-25 DIAGNOSIS — E785 Hyperlipidemia, unspecified: Secondary | ICD-10-CM

## 2021-04-25 DIAGNOSIS — Z7189 Other specified counseling: Secondary | ICD-10-CM | POA: Diagnosis not present

## 2021-04-25 DIAGNOSIS — I471 Supraventricular tachycardia: Secondary | ICD-10-CM

## 2021-04-25 DIAGNOSIS — I2583 Coronary atherosclerosis due to lipid rich plaque: Secondary | ICD-10-CM | POA: Diagnosis not present

## 2021-04-25 DIAGNOSIS — I2510 Atherosclerotic heart disease of native coronary artery without angina pectoris: Secondary | ICD-10-CM | POA: Diagnosis not present

## 2021-04-25 DIAGNOSIS — R002 Palpitations: Secondary | ICD-10-CM | POA: Diagnosis not present

## 2021-04-25 DIAGNOSIS — Z8249 Family history of ischemic heart disease and other diseases of the circulatory system: Secondary | ICD-10-CM

## 2021-04-25 NOTE — Patient Instructions (Signed)
Medication Instructions:  Continue current medications  *If you need a refill on your cardiac medications before your next appointment, please call your pharmacy*   Lab Work: None Ordered   Testing/Procedures: None Ordered   Follow-Up: At [facility], you and your health needs are our priority.  As part of our continuing mission to provide you with exceptional heart care, we have created designated Provider Care Teams.  These Care Teams include your primary Cardiologist (physician) and Advanced Practice Providers (APPs -  Physician Assistants and Nurse Practitioners) who all work together to provide you with the care you need, when you need it.  We recommend signing up for the patient portal called "MyChart".  Sign up information is provided on this After Visit Summary.  MyChart is used to connect with patients for Virtual Visits (Telemedicine).  Patients are able to view lab/test results, encounter notes, upcoming appointments, etc.  Non-urgent messages can be sent to your provider as well.   To learn more about what you can do with MyChart, go to [email or web address].    Your next appointment:    As Needed

## 2021-04-25 NOTE — Progress Notes (Signed)
Cardiology Office Note:    Date:  04/25/2021   ID:  Olivia, Bender 12-22-1950, MRN [id number]  PCP:  Marin Olp, MD  Cardiologist:  Buford Dresser, MD  Referring MD: Marin Olp, MD   CC: new patient visit with me to discuss palpitations and monitor results  History of Present Illness:    Olivia Bender is a 70 y.o. female with a hx of hyperlipidemia, hypothyroidism, arthritis, GERD, depression, and anxiety, who is seen as a new consult at the request of Marin Olp, MD for the evaluation and management of palpitations.  She follows with Dr. 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 She is on Praluent.  Olivia Bender saw Dr. Yong Channel 03/19/2021 and reported shortness of breath associated with her tachycardia, with onset 6 months prior.   Today, she is accompanied by her husband.  Tachycardia/palpitations: -Initial onset: Her palpitations began following her recent infection with Covid. -Frequency/Duration: Intermittent -Associated symptoms: Shortness of breath with higher heart rates/more severe episodes. Also headaches that "feel like an ice pick" since her Covid infection. -Aggravating/alleviating factors: -Syncope/near syncope: None -Prior cardiac history: hypercholesterolemia, followed by Dr. Debara Pickett -Prior workup: none -Prior treatment: none -Caffeine: Half-N-Half coffee in the morning and at night, 1-2 cups a day typically. -Alcohol: None -Tobacco: None -Comorbidities: Hypertension dx earlier this year -Exercise level: Goes up stairs daily at home -Labs: TSH, kidney function/electrolytes, CBC reviewed. -Cardiac ROS: no chest pain, +shortness of breath, no PND, no orthopnea, no LE edema. -Family history: Her mother had atrial fibrillation and a stroke. Maternal grandfather had 2-4 heart attacks. Has a first cousin who had a heart attack.  Overall, she is feeling pretty good. We reviewed the  results of her monitor in detail, which was reassuring.  Sometimes she will feel her heart racing. During severe episodes she feels like she is unable to breathe. She does not feel any fluid retention.  Previously she developed lightheadedness after climbing 2 steps on a ladder. This resolved after making sure to stay better hydrated.  She denies any chest pain, syncope, orthopnea, PND, lower extremity edema or exertional symptoms.  Past Medical History:  Diagnosis Date   Allergy    Anxiety    Arthritis    Constipation    Enlarged liver    Generalized headaches    since teenage years- usually can get away with tylenol   Hyperlipidemia    Osteopenia    Thyroid disease    hypothyroidism    Past Surgical History:  Procedure Laterality Date   arthroscopic left knee surgery     DILATION AND CURETTAGE OF UTERUS     monthlong period related. everything was ok   NASAL SINUS SURGERY  1986    Current Medications: Current Outpatient Medications on File Prior to Visit  Medication Sig   albuterol (VENTOLIN HFA) 108 (90 Base) MCG/ACT inhaler    Alirocumab (PRALUENT) 150 MG/ML SOAJ Inject 150 mg into the skin every 14 (fourteen) days.   amLODipine (NORVASC) 2.5 MG tablet Take 1 tablet (2.5 mg total) by mouth daily.   aspirin 81 MG EC tablet Take 81 mg by mouth daily.   Azelastine HCl 137 MCG/SPRAY SOLN Place into the nose.   cyclobenzaprine (FLEXERIL) 10 MG tablet Take 10 mg by mouth 3 (three) times daily as needed.   Ferrous Sulfate (IRON PO) Take by mouth. 3 times a week   levocetirizine (XYZAL) 5 MG tablet Take 5 mg by  mouth every evening. Prn only   levothyroxine (SYNTHROID) 75 MCG tablet TAKE 1 TABLET BY MOUTH EVERY DAY   omeprazole (PRILOSEC OTC) 20 MG tablet Take 20 mg by mouth as needed.   sertraline (ZOLOFT) 50 MG tablet TAKE 1 TABLET BY MOUTH EVERY DAY   triamcinolone (NASACORT) 55 MCG/ACT AERO nasal inhaler Place 2 sprays into the nose daily.   VITAMIN D PO Take by mouth.    No current facility-administered medications on file prior to visit.     Allergies:   Tetanus toxoids, Latex, Ezetimibe, and Statins   Social History   Tobacco Use   Smoking status: Never   Smokeless tobacco: Never  Substance Use Topics   Alcohol use: No   Drug use: Never    Family History: family history includes Heart disease in her maternal grandfather and maternal grandmother; Hypertension in her mother; Kidney cancer in her son; Obesity in her sister; Other in her father; Prostate cancer in her father; Stroke in her mother.  ROS:   Please see the history of present illness.  Additional pertinent ROS: Constitutional: Negative for chills, fever, night sweats, unintentional weight loss  HENT: Negative for ear pain and hearing loss.   Eyes: Negative for loss of vision and eye pain.  Respiratory: Positive for shortness of breath. Negative for cough, sputum, wheezing.   Cardiovascular: See HPI. Gastrointestinal: Negative for abdominal pain, melena, and hematochezia.  Genitourinary: Negative for dysuria and hematuria.  Musculoskeletal: Negative for falls and myalgias.  Skin: Negative for itching and rash.  Neurological: Positive for headaches. Negative for focal weakness, focal sensory changes and loss of consciousness.  Endo/Heme/Allergies: Does not bruise/bleed easily.     EKGs/Labs/Other Studies Reviewed:    The following studies were reviewed today:  Monitor 03/2021-04/2021: Patch Wear Time:  13 days and 23 hours    Patient had a min HR of 61 bpm, max HR of 167 bpm, and avg HR of 82 bpm. Predominant underlying rhythm was Sinus Rhythm. 8 Supraventricular Tachycardia runs occurred, the run with the fastest interval lasting 11 beats with a max rate of 167 bpm (avg 148 bpm); the run with the fastest interval was also the longest. No VT, atrial fibrillation, high degree block, or pauses noted. Isolated atrial and ventricular ectopy was rare (<1%). There were 4 triggered  events. Two were normal sinus rhythm, two were normal sinus rhythm with a PAC. No high risk arrhythmias noted.  LE Venous DVT 03/22/2021: Summary:  RIGHT:  - There is no evidence of deep vein thrombosis in the lower extremity.  - There is no evidence of superficial venous thrombosis.  - No cystic structure found in the popliteal fossa.     LEFT:  - There is no evidence of deep vein thrombosis in the lower extremity.  - There is no evidence of superficial venous thrombosis.  - No cystic structure found in the popliteal fossa.   CT Super D Chest 07/25/2020: COMPARISON:  10/13/2019, 12/11/2018, 07/29/2018   FINDINGS: Cardiovascular: Aortic atherosclerosis. Normal heart size. Left coronary artery calcifications. No pericardial effusion.   Mediastinum/Nodes: No enlarged mediastinal, hilar, or axillary lymph nodes. Thyroid gland, trachea, and esophagus demonstrate no significant findings.   Lungs/Pleura: Low volume examination with significant eventration of the bilateral hemidiaphragms and associated partial atelectasis. Unchanged 0.9 x 0.9 cm nodule of the dependent right lower lobe (series 5, image 51). No pleural effusion or pneumothorax.   Upper Abdomen: No acute abnormality. Small gallstones in the dependent gallbladder.   Musculoskeletal:  No chest wall mass or suspicious bone lesions identified.   IMPRESSION: 1. Unchanged 0.9 x 0.9 cm nodule of the dependent right lower lobe. This has been stable for 2 years and is very likely benign. No further follow-up or characterization is required. 2. Low volume examination with significant eventration of the bilateral hemidiaphragms and associated partial atelectasis. 3. Coronary artery disease.   Aortic Atherosclerosis (ICD10-I70.0).  EKG:  EKG is personally reviewed.   04/25/2021: NSR at 92 bpm with left axis deviation  Recent Labs: 03/19/2021: ALT 23; BUN 13; Creatinine, Ser 0.79; Hemoglobin 12.1; Platelets 225.0; Potassium  4.0; Sodium 140; TSH 2.72   Recent Lipid Panel    Component Value Date/Time   CHOL 137 12/15/2020 0914   TRIG 177 (H) 12/15/2020 0914   HDL 43 12/15/2020 0914   CHOLHDL 3.2 12/15/2020 0914   CHOLHDL 4.2 12/10/2019 1628   VLDL 21.4 05/18/2018 0936   LDLCALC 64 12/15/2020 0914   LDLCALC 96 12/10/2019 1628   LDLDIRECT 184.0 11/21/2017 1106    Physical Exam:    VS:  BP 132/80 (BP Location: Left Arm, Patient Position: Sitting, Cuff Size: Large)   Pulse 92   Ht 5\' 1"  (1.549 m)   Wt 200 lb 9.6 oz (91 kg)   BMI 37.90 kg/m     Wt Readings from Last 3 Encounters:  04/25/21 200 lb 9.6 oz (91 kg)  03/19/21 200 lb 6.4 oz (90.9 kg)  02/19/21 200 lb 9.6 oz (91 kg)    GEN: Well nourished, well developed in no acute distress HEENT: Normal, moist mucous membranes NECK: No JVD CARDIAC: regular rhythm, normal S1 and S2, no rubs or gallops. No murmur. VASCULAR: Radial and DP pulses 2+ bilaterally. No carotid bruits RESPIRATORY:  Clear to auscultation without rales, wheezing or rhonchi  ABDOMEN: Soft, non-tender, non-distended MUSCULOSKELETAL:  Ambulates independently SKIN: Warm and dry, no edema NEUROLOGIC:  Alert and oriented x 3. No focal neuro deficits noted. PSYCHIATRIC:  Normal affect    ASSESSMENT:    1. Palpitations   2. Encounter to discuss test results   3. Dyslipidemia   4. Family history of coronary artery disease   5. Cardiac risk counseling   6. Counseling on health promotion and disease prevention   7. Paroxysmal SVT (supraventricular tachycardia) (HCC)    PLAN:    Palpitations Extensive review of monitor results Paroxysmal SVT -noted symptoms after Covid. Symptoms are stable and possibly improving -reviewed monitor at length. Brief paroxysmal SVT, not when she is symptomatic. No high risk features -we discussed red flag warning signs that need immediate medical attention -if symptoms worsen, she will contact me -also discussed Eddystone  today  Dyslipidemia Family history of CAD -concern for familial hypercholesterolemia -on Praluent, follows with Dr. Debara Pickett  Cardiac risk counseling and prevention recommendations: -recommend heart healthy/Mediterranean diet, with whole grains, fruits, vegetable, fish, lean meats, nuts, and olive oil. Limit salt. -recommend moderate walking, 3-5 times/week for 30-50 minutes each session. Aim for at least 150 minutes.week. Goal should be pace of 3 miles/hours, or walking 1.5 miles in 30 minutes -recommend avoidance of tobacco products. Avoid excess alcohol. -ASCVD risk score: The 10-year ASCVD risk score (Arnett DK, et al., 2019) is: 12.5%   Values used to calculate the score:     Age: 33 years     Sex: Female     Is Non-Hispanic African American: No     Diabetic: No     Tobacco smoker: No     Systolic  Blood Pressure: 132 mmHg     Is BP treated: Yes     HDL Cholesterol: 43 mg/dL     Total Cholesterol: 137 mg/dL    Plan for follow up: 1 year or sooner as needed.  Buford Dresser, MD, PhD, [facility]    Medication Adjustments/Labs and Tests Ordered: Current medicines are reviewed at length with the patient today.  Concerns regarding medicines are outlined above.   Orders Placed This Encounter  Procedures   EKG 12-Lead    No orders of the defined types were placed in this encounter.  Patient Instructions  Medication Instructions:  Continue current medications  *If you need a refill on your cardiac medications before your next appointment, please call your pharmacy*   Lab Work: None Ordered   Testing/Procedures: None Ordered   Follow-Up: At [facility], you and your health needs are our priority.  As part of our continuing mission to provide you with exceptional heart care, we have created designated Provider Care Teams.  These Care Teams include your primary Cardiologist (physician) and Advanced Practice Providers (APPs -  Physician  Assistants and Nurse Practitioners) who all work together to provide you with the care you need, when you need it.  We recommend signing up for the patient portal called "MyChart".  Sign up information is provided on this After Visit Summary.  MyChart is used to connect with patients for Virtual Visits (Telemedicine).  Patients are able to view lab/test results, encounter notes, upcoming appointments, etc.  Non-urgent messages can be sent to your provider as well.   To learn more about what you can do with MyChart, go to [email or web address].    Your next appointment:    As Needed   I,Mathew Stumpf,acting as a scribe for Buford Dresser, MD.,have documented all relevant documentation on the behalf of Buford Dresser, MD,as directed by  Buford Dresser, MD while in the presence of Buford Dresser, MD.  I, Buford Dresser, MD, have reviewed all documentation for this visit. The documentation on 04/25/21 for the exam, diagnosis, procedures, and orders are all accurate and complete.   Signed, Buford Dresser, MD PhD 04/25/2021 3:25 PM    [place]

## 2021-05-02 DIAGNOSIS — I10 Essential (primary) hypertension: Secondary | ICD-10-CM | POA: Diagnosis not present

## 2021-05-02 DIAGNOSIS — E039 Hypothyroidism, unspecified: Secondary | ICD-10-CM

## 2021-05-02 DIAGNOSIS — J45909 Unspecified asthma, uncomplicated: Secondary | ICD-10-CM | POA: Diagnosis not present

## 2021-05-02 DIAGNOSIS — F32A Depression, unspecified: Secondary | ICD-10-CM | POA: Diagnosis not present

## 2021-05-02 DIAGNOSIS — E785 Hyperlipidemia, unspecified: Secondary | ICD-10-CM

## 2021-06-19 ENCOUNTER — Ambulatory Visit: Payer: Medicare Other | Admitting: Podiatry

## 2021-06-27 ENCOUNTER — Encounter: Payer: Self-pay | Admitting: Family

## 2021-06-27 ENCOUNTER — Ambulatory Visit (INDEPENDENT_AMBULATORY_CARE_PROVIDER_SITE_OTHER): Payer: Medicare Other | Admitting: Family

## 2021-06-27 ENCOUNTER — Other Ambulatory Visit: Payer: Self-pay

## 2021-06-27 VITALS — BP 146/81 | HR 93 | Temp 97.7°F | Ht 61.0 in | Wt 201.0 lb

## 2021-06-27 DIAGNOSIS — J069 Acute upper respiratory infection, unspecified: Secondary | ICD-10-CM | POA: Diagnosis not present

## 2021-06-27 MED ORDER — CHERATUSSIN AC 100-10 MG/5ML PO SYRP: 10.0000 mL | ORAL_SOLUTION | Freq: Every evening | ORAL | 0 refills | Status: DC | PRN

## 2021-06-27 MED ORDER — METHYLPREDNISOLONE ACETATE 80 MG/ML IJ SUSP
40.0000 mg | Freq: Once | INTRAMUSCULAR | Status: AC
  Administered 2021-06-27: 17:00:00 40 mg via INTRAMUSCULAR

## 2021-06-27 NOTE — Assessment & Plan Note (Signed)
sx for 5 days, advised to continue Mucinex and Tylenol, also already on Nasacort and Azelastine bid, Xyzal qd, sending cough syrup, advised to use at bedtime and during day only if not driving. Drink at least 2 liters of water daily.

## 2021-06-27 NOTE — Patient Instructions (Addendum)
It was very nice to see you today!  I have sent cough syrup to your pharmacy. Continue to take Mucinex and Tylenol for your symptoms during the day. Drink at least 2 liters of water daily.  Let us know if you are no getting better next week.     PLEASE NOTE:  If you had any lab tests please let us know if you have not heard back within a few days. You may see your results on MyChart before we have a chance to review them but we will give you a call once they are reviewed by [place]. If we ordered any referrals today, please let us know if you have not heard from their office within the next week.   Please try these tips to maintain a healthy lifestyle:  Eat most of your calories during the day when you are active. Eliminate processed foods including packaged sweets (pies, cakes, cookies), reduce intake of potatoes, white bread, white pasta, and white rice. Look for whole grain options, oat flour or almond flour.  Each meal should contain half fruits/vegetables, one quarter protein, and one quarter carbs (no bigger than a computer mouse).  Cut down on sweet beverages. This includes juice, soda, and sweet tea. Also watch fruit intake, though this is a healthier sweet option, it still contains natural sugar! Limit to 3 servings daily.  Drink at least 1 glass of water with each meal and aim for at least 8 glasses per day  Exercise at least 150 minutes every week.

## 2021-06-27 NOTE — Progress Notes (Signed)
Subjective:     Patient ID: Olivia Bender, female    DOB: 01/07/1951, 71 y.o.   MRN: [id number]  Chief Complaint  Patient presents with   URI    Covid test-negative today. Started Saturday.    Cough   Nasal Congestion    Yellow in color    HPI: Upper Respiratory Infection: Symptoms include congestion, low grade fever, non productive cough, post nasal drip, sore throat, and nasal drainage .  Onset of symptoms was 5 days ago, unchanged since that time. She is drinking moderate amounts of fluids. Evaluation to date: none.  Treatment to date: antihistamines, cough suppressants, and nasal steroids. OTC Mucinex and Delsym.   There are no preventive care reminders to display for this patient.  Past Medical History:  Diagnosis Date   Allergy    Anxiety    Arthritis    Constipation    Enlarged liver    Generalized headaches    since teenage years- usually can get away with tylenol   Hyperlipidemia    Osteopenia    Thyroid disease    hypothyroidism    Past Surgical History:  Procedure Laterality Date   arthroscopic left knee surgery     DILATION AND CURETTAGE OF UTERUS     monthlong period related. everything was ok   NASAL SINUS SURGERY  1986    Outpatient Medications Prior to Visit  Medication Sig Dispense Refill   albuterol (VENTOLIN HFA) 108 (90 Base) MCG/ACT inhaler      Alirocumab (PRALUENT) 150 MG/ML SOAJ Inject 150 mg into the skin every 14 (fourteen) days. 2 mL 11   amLODipine (NORVASC) 2.5 MG tablet Take 1 tablet (2.5 mg total) by mouth daily. 90 tablet 3   aspirin 81 MG EC tablet Take 81 mg by mouth daily.     Azelastine HCl 137 MCG/SPRAY SOLN Place into the nose.     cyclobenzaprine (FLEXERIL) 10 MG tablet Take 10 mg by mouth 3 (three) times daily as needed.     Ferrous Sulfate (IRON PO) Take by mouth. 3 times a week     levocetirizine (XYZAL) 5 MG tablet Take 5 mg by mouth every evening. Prn only     levothyroxine (SYNTHROID) 75 MCG tablet TAKE 1 TABLET BY  MOUTH EVERY DAY 90 tablet 3   omeprazole (PRILOSEC OTC) 20 MG tablet Take 20 mg by mouth as needed.     sertraline (ZOLOFT) 50 MG tablet TAKE 1 TABLET BY MOUTH EVERY DAY 90 tablet 3   triamcinolone (NASACORT) 55 MCG/ACT AERO nasal inhaler Place 2 sprays into the nose daily.     VITAMIN D PO Take by mouth.     No facility-administered medications prior to visit.    Allergies  Allergen Reactions   Tetanus Toxoids Anaphylaxis   Latex Rash   Ezetimibe Other (See Comments)    Myalgia   Statins Other (See Comments)    Lipitor, Crestor, Livalo -myalgias        Objective:    Physical Exam Vitals and nursing note reviewed.  Constitutional:      Appearance: Normal appearance. She is obese.  HENT:     Right Ear: Tympanic membrane and ear canal normal.     Left Ear: Tympanic membrane and ear canal normal.     Nose:     Right Sinus: No frontal sinus tenderness.     Left Sinus: No frontal sinus tenderness.     Mouth/Throat:     Mouth: Mucous membranes are  moist.     Pharynx: No oropharyngeal exudate or posterior oropharyngeal erythema.     Tonsils: No tonsillar exudate or tonsillar abscesses.  Cardiovascular:     Rate and Rhythm: Normal rate and regular rhythm.  Pulmonary:     Effort: Pulmonary effort is normal.     Breath sounds: Normal breath sounds.  Musculoskeletal:        General: Normal range of motion.  Skin:    General: Skin is warm and dry.  Neurological:     Mental Status: She is alert.  Psychiatric:        Mood and Affect: Mood normal.        Behavior: Behavior normal.    BP (!) 146/81    Pulse 93    Temp 97.7 F (36.5 C) (Temporal)    Ht 5\' 1"  (1.549 m)    Wt 201 lb (91.2 kg)    SpO2 95%    BMI 37.98 kg/m  Wt Readings from Last 3 Encounters:  06/27/21 201 lb (91.2 kg)  04/25/21 200 lb 9.6 oz (91 kg)  03/19/21 200 lb 6.4 oz (90.9 kg)       Assessment & Plan:   Problem List Items Addressed This Visit       Respiratory   Viral upper respiratory tract  infection - Primary    sx for 5 days, advised to continue Mucinex and Tylenol, also already on Nasacort and Azelastine bid, Xyzal qd, sending cough syrup, advised to use at bedtime and during day only if not driving. Drink at least 2 liters of water daily.       Relevant Medications   guaiFENesin-codeine (CHERATUSSIN AC) 100-10 MG/5ML syrup    Meds ordered this encounter  Medications   DISCONTD: guaiFENesin-codeine (CHERATUSSIN AC) 100-10 MG/5ML syrup    Sig: Take 10 mLs by mouth at bedtime as needed for cough or congestion.    Dispense:  118 mL    Refill:  0    Order Specific Question:   Supervising Provider    Answer:   ANDY, CAMILLE L [[id number]]   methylPREDNISolone acetate (DEPO-MEDROL) injection 40 mg   guaiFENesin-codeine (CHERATUSSIN AC) 100-10 MG/5ML syrup    Sig: Take 10 mLs by mouth at bedtime as needed for cough or congestion.    Dispense:  118 mL    Refill:  0    Order Specific Question:   Supervising Provider    Answer:   ANDY, CAMILLE L [[id number]]

## 2021-09-12 ENCOUNTER — Other Ambulatory Visit: Payer: Self-pay | Admitting: Family Medicine

## 2021-09-14 ENCOUNTER — Other Ambulatory Visit: Payer: Self-pay | Admitting: Internal Medicine

## 2021-10-05 NOTE — Progress Notes (Deleted)
Chronic Care Management Pharmacy Note  10/05/2021 Name:  Olivia Bender MRN:  [id number] DOB:  08-29-1950  Subjective: Olivia Bender is an 71 y.o. year old female who is a primary patient of Hunter, Brayton Mars, MD.  The CCM team was consulted for assistance with disease management and care coordination needs.    Engaged with patient by telephone for follow up visit in response to provider referral for pharmacy case management and/or care coordination services.   Consent to Services:  The patient was given the following information about Chronic Care Management services today, agreed to services, and gave verbal consent: 1. CCM service includes personalized support from designated clinical staff supervised by the primary care provider, including individualized plan of care and coordination with other care providers 2. 24/7 contact phone numbers for assistance for urgent and routine care needs. 3. Service will only be billed when office clinical staff spend 20 minutes or more in a month to coordinate care. 4. Only one practitioner may furnish and bill the service in a calendar month. 5.The patient may stop CCM services at any time (effective at the end of the month) by phone call to the office staff. 6. The patient will be responsible for cost sharing (co-pay) of up to 20% of the service fee (after annual deductible is met). Patient agreed to services and consent obtained.  Patient Care Team: Marin Olp, MD as PCP - General (Family Medicine) Buford Dresser, MD as PCP - Cardiology (Cardiology) Garrel Ridgel, DPM as Consulting Physician (Podiatry) Debara Pickett, Nadean Corwin, MD as Consulting Physician (Cardiology) Harold Hedge, Darrick Grinder, MD as Consulting Physician (Allergy and Immunology) Edythe Clarity, [facility] (Pharmacist)  Recent office visits: 03/19/21 Yong Channel) - no blood clots in legs, she complains of palpitations 11/21/20 Yong Channel) - started on Amlodipine 2.64m daily due to multiple  elevated readings.  Hospital visits: None in previous 6 months  Objective:  Lab Results  Component Value Date   CREATININE 0.79 03/19/2021   CREATININE 0.80 09/13/2020   CREATININE 0.71 12/10/2019    Lab Results  Component Value Date   HGBA1C 5.8 03/19/2021   Last diabetic Eye exam: No results found for: HMDIABEYEEXA  Last diabetic Foot exam: No results found for: HMDIABFOOTEX      Component Value Date/Time   CHOL 137 12/15/2020 0914   TRIG 177 (H) 12/15/2020 0914   HDL 43 12/15/2020 0914   CHOLHDL 3.2 12/15/2020 0914   CHOLHDL 4.2 12/10/2019 1628   VLDL 21.4 05/18/2018 0936   LDLCALC 64 12/15/2020 0914   LDLCALC 96 12/10/2019 1628   LDLDIRECT 184.0 11/21/2017 1106       Latest Ref Rng & Units 03/19/2021   10:35 AM 12/15/2020    9:11 AM 09/13/2020   10:22 AM  Hepatic Function  Total Protein 6.0 - 8.3 g/dL 6.9   7.1   6.8    Albumin 3.5 - 5.2 g/dL 4.2   4.4   3.8    AST 0 - 37 U/L 28   38   26    ALT 0 - 35 U/L '23   27   23    ' Alk Phosphatase 39 - 117 U/L 67   78   58    Total Bilirubin 0.2 - 1.2 mg/dL 0.4   0.5   0.4    Bilirubin, Direct 0.00 - 0.40 mg/dL  0.14       Lab Results  Component Value Date/Time   TSH 2.72 03/19/2021 10:35  AM   TSH 1.64 09/13/2020 10:22 AM       Latest Ref Rng & Units 03/19/2021   10:35 AM 09/13/2020   10:22 AM 03/14/2020   11:17 AM  CBC  WBC 4.0 - 10.5 K/uL 6.4   5.6   8.3    Hemoglobin 12.0 - 15.0 g/dL 12.1   11.8   12.4    Hematocrit 36.0 - 46.0 % 36.1   35.3   37.1    Platelets 150.0 - 400.0 K/uL 225.0   227.0   268      Lab Results  Component Value Date/Time   VD25OH 43 05/22/2017 10:45 AM   VD25OH 31 12/11/2015 01:18 PM    Clinical ASCVD:  The 10-year ASCVD risk score (Arnett DK, et al., 2019) is: 16.8%   Values used to calculate the score:     Age: 50 years     Sex: Female     Is Non-Hispanic African American: No     Diabetic: No     Tobacco smoker: No     Systolic Blood Pressure: 544 mmHg     Is BP  treated: Yes     HDL Cholesterol: 43 mg/dL     Total Cholesterol: 137 mg/dL    Social History   Tobacco Use  Smoking Status Never  Smokeless Tobacco Never   BP Readings from Last 3 Encounters:  06/27/21 (!) 146/81  04/25/21 132/80  03/19/21 126/76   Pulse Readings from Last 3 Encounters:  06/27/21 93  04/25/21 92  03/19/21 95   Wt Readings from Last 3 Encounters:  06/27/21 201 lb (91.2 kg)  04/25/21 200 lb 9.6 oz (91 kg)  03/19/21 200 lb 6.4 oz (90.9 kg)    Assessment: Review of patient past medical history, allergies, medications, health status, including review of consultants reports, laboratory and other test data, was performed as part of comprehensive evaluation and provision of chronic care management services.   SDOH:  (Social Determinants of Health) assessments and interventions performed: Yes   CCM Care Plan  Allergies  Allergen Reactions   Tetanus Toxoids Anaphylaxis   Latex Rash   Ezetimibe Other (See Comments)    Myalgia   Statins Other (See Comments)    Lipitor, Crestor, Livalo -myalgias    Medications Reviewed Today     Reviewed by Tobe Sos, CMA (Certified Medical Assistant) on 06/27/21 at [place] List Status: <None>   Medication Order Taking? Sig Documenting Provider Last Dose Status Informant  albuterol (VENTOLIN HFA) 108 (90 Base) MCG/ACT inhaler [id number] Yes  [provider] Taking Active            Med Note Willette Brace   Fri Jan 21, 2020 11:30 AM) As needed  Alirocumab (PRALUENT) 150 MG/ML SOAJ [id number] Yes Inject 150 mg into the skin every 14 (fourteen) days. Pixie Casino, MD Taking Active   amLODipine (NORVASC) 2.5 MG tablet [id number] Yes Take 1 tablet (2.5 mg total) by mouth daily. Marin Olp, MD Taking Active   aspirin 81 MG EC tablet [id number] Yes Take 81 mg by mouth daily. [provider] Taking Active   Azelastine HCl 137 MCG/SPRAY SOLN [id number] Yes Place into the nose. [provider] Taking Active            Med Note Willette Brace   Fri Jan 21, 2020 11:30 AM) As needed   cyclobenzaprine (FLEXERIL) 10 MG tablet [id number] Yes Take 10 mg by mouth 3 (  three) times daily as needed. [provider] Taking Active            Med Note Willette Brace   Fri Jan 21, 2020 11:31 AM) As needed  Ferrous Sulfate (IRON PO) [id number] Yes Take by mouth. 3 times a week [provider] Taking Active   levocetirizine (XYZAL) 5 MG tablet [id number] Yes Take 5 mg by mouth every evening. Prn only [provider] Taking Active   levothyroxine (SYNTHROID) 75 MCG tablet [id number] Yes TAKE 1 TABLET BY MOUTH EVERY DAY Marin Olp, MD Taking Active   omeprazole (PRILOSEC OTC) 20 MG tablet [id number] Yes Take 20 mg by mouth as needed. [provider] Taking Active   sertraline (ZOLOFT) 50 MG tablet [id number] Yes TAKE 1 TABLET BY MOUTH EVERY DAY Marin Olp, MD Taking Active   triamcinolone (NASACORT) 55 MCG/ACT AERO nasal inhaler [id number] Yes Place 2 sprays into the nose daily. [provider] Taking Active            Med Note Willette Brace   Fri Jan 21, 2020 11:31 AM) As needed   VITAMIN D PO [id number] Yes Take by mouth. [provider] Taking Active             Patient Active Problem List   Diagnosis Date Noted   Viral upper respiratory tract infection 06/27/2021   Pulmonary nodule/lesion, solitary 02/21/2020   Essential hypertension 12/10/2019   Aortic atherosclerosis ([place]) 12/11/2018   Major depression in full remission ([place]) 05/18/2018   Asthma 11/21/2017   Generalized headaches    Trochanteric bursitis of left hip 12/11/2015   Fatty infiltration of liver 12/30/2014   Statin intolerance 06/30/2014   Left knee pain 06/30/2014   Prediabetes 06/30/2014   Hypothyroidism 12/01/2010   Hyperlipidemia 12/01/2010   Osteopenia 12/01/2010   Allergic rhinitis 12/01/2010   GE reflux 12/01/2010     Immunization History  Administered Date(s) Administered   Fluad Quad(high Dose 65+) 03/14/2020, 02/19/2021   Influenza, High Dose Seasonal PF 04/20/2018, 03/04/2019   Influenza,inj,Quad PF,6+ Mos 05/22/2017   Influenza-Unspecified 04/17/2014   PFIZER(Purple Top)SARS-COV-2 Vaccination 12/17/2019, 01/06/2020, 06/08/2020   Pneumococcal Conjugate-13 07/28/2015   Pneumococcal Polysaccharide-23 07/14/2005, 11/21/2017   Zoster Recombinat (Shingrix) 04/20/2019, 09/15/2019   Zoster, Live 12/23/2011    Conditions to be addressed/monitored: Asthma, HTN, Aortic Atherosclerosis, Osteopenia, GERD, Hypothyroidism, MDD, Statin intolerance, Fatty liver  There are no care plans that you recently modified to display for this patient.      Future Appointments  Date Time Provider [place]  10/09/2021  1:30 PM [facility] CCM PHARMACIST [facility]  02/28/2022  8:45 AM [facility] HEALTH COACH [facility] [place], PharmD Clinical Pharmacist [phone]    Current Barriers:  Ongoing GERD Symptoms, needing to come up with excercise goals  Pharmacist Clinical Goal(s):  Patient will contact provider office for questions/concerns as evidenced notation of same in electronic health record through collaboration with PharmD and provider.   Interventions: 1:1 collaboration with Marin Olp, MD regarding development and update of comprehensive plan of care as evidenced by provider attestation and co-signature Inter-disciplinary care team collaboration (see longitudinal plan of care) Comprehensive medication review performed; medication list updated in electronic medical record No med changes at this time  Hypertension (BP goal <130/80) -Controlled based on last three OV BP readings  -Current treatment: Amlodipine 2.72m daily -Medications previously tried: no medications, some previous weight loss seemed to have improved BP management.  -Current home  readings: none  provided. Does have BP cuff. -Current dietary patterns: trying not to snack - cutting down on sweets. Denies routine intake of fruits/vegetables. Current exercise habits: trying to walk but hip had hurt so had quit and did not revisit.   -Denies hypotensive/hypertensive symptoms -Counseled to monitor BP at home 1-2x every 2 weeks, document, and provide log at future appointments -Recommended to continue current medication   Update 12/26/20 Started Amlodipine 2.67m daily about one month ago. Home BP readings: 119/76 today Swelling? Denies any swelling She reports she is riding the stationary bike about 4 times per week for 20 minutes!! Congratulated her for this huge step in lifestyle modification.  Continue current meds for now - BP monitoring to continue and regular follow up  Update 04/03/21 BP has been normal lately. She did have some swelling - Ddimer was negative for clotting.  However she is currently wearing a heart monitor from cardiology. She was having dizziness with unknown origin. Recommend continue current meds for now - continues positive lifestyle changes. FU after heart monitor  Depression/Anxiety (Goal: minimize symptoms) -Controlled  -Feels sertraline has helped her a lot, did have concern over husbands health for a while. Feels there are some symptoms of depression but is not wanting to further adjust medication or seek additional support. -Current treatment: Sertraline 50 mg once daily  -PHQ9: not performed today, last 12 on 09/13/2020 -Educated on Benefits of medication for symptom control -Recommended to continue current medication  Hyperlipidemia: (LDL goal < 70) -Likely controlled, is needed updated labs to further assess. Reports to have just given first Praluent injection last week. No cost concerns due to receiving assistance through [employer]  -HTN, Aortic Atherosclerosis  -Statin intolerance -Current treatment: Praluent 150 mg once every 14 days  (08/29/2020 - Dr HDebara Pickett -Educated on Cholesterol goals;  -Recommended to continue current medication  -Plan to walk 2-3x/week  Update 04/03/21 Still has grant for [employer] which expires in March 2023. LDL remains controlled - no concerns at this time. Continue current meds, will help reapply and follow up on [employer] grant in march 2023. FU appt scheduled with CMA for that time.  Asthma (Goal: control symptoms and prevent exacerbations) -Controlled -Current treatment  Albuterol 90 mcg/act PRN -Exacerbations requiring treatment in last 6 months: 0 -Patient denies consistent use of maintenance inhaler. Not needed.  -Frequency of rescue inhaler use: has not used  -Counseled on When to use rescue inhaler -Recommended to continue current medication  Hypothyroidism (Goal: maintain normal TSH) -Controlled  -Admits to missed dose once every 1-2 weeks - will start focusing on taking first thing in AM consistently  -Current treatment  Levothyroxine 75 mcg once daily -Recommended to continue current medication  GERD (minimize symptoms) -Not ideally controlled -Symptoms 1-2x/week. Large amounts of coffee and does have a glass of lemonade once daily. Has given up on omeprazole due to ongoing symptoms and just uses tums. -Regimen: Omeprazole 20 mg once daily OTC (held) Reviewed reflux triggers at length Will check on dietary progress next month  Update 12/26/20 Working on triggers? Yes Symptoms? Comes and goes Taking Omeprazole? Yes she is taking it every night after dinner Recommended she switch to about 30-60 minutes before dinner for optimal efficacy.  Patient agrees to try this and we will follow up to see if this helps her symptoms.  Patient Goals/Self-Care Activities Patient will:  - take medications as prescribed target a minimum of 150 minutes of moderate intensity exercise weekly engage in dietary modifications by  reducing coffee and lemonade intake. goal exercise is  2-3x/week  Follow Up Plan: 3 month PharmD fu call Medication Assistance: None required.  Patient affirms current coverage meets needs.  Patient's preferred pharmacy is:  [id number] [place] [id number]: [phone]: [phone] Follow Up:  Patient agrees to Care Plan and Follow-up.

## 2021-10-09 ENCOUNTER — Telehealth: Payer: Medicare Other

## 2021-11-05 ENCOUNTER — Encounter: Payer: Self-pay | Admitting: Podiatry

## 2021-11-05 ENCOUNTER — Ambulatory Visit: Payer: Medicare Other

## 2021-11-05 ENCOUNTER — Ambulatory Visit (INDEPENDENT_AMBULATORY_CARE_PROVIDER_SITE_OTHER): Payer: Medicare Other | Admitting: Podiatry

## 2021-11-05 DIAGNOSIS — M722 Plantar fascial fibromatosis: Secondary | ICD-10-CM

## 2021-11-05 DIAGNOSIS — J4520 Mild intermittent asthma, uncomplicated: Secondary | ICD-10-CM | POA: Insufficient documentation

## 2021-11-05 DIAGNOSIS — H1045 Other chronic allergic conjunctivitis: Secondary | ICD-10-CM | POA: Insufficient documentation

## 2021-11-05 MED ORDER — TRIAMCINOLONE ACETONIDE 40 MG/ML IJ SUSP
40.0000 mg | Freq: Once | INTRAMUSCULAR | Status: AC
  Administered 2021-11-05: 40 mg

## 2021-11-05 NOTE — Progress Notes (Signed)
She presents today having not seen her for the past couple of years chief complaint of similar pain that she had previously to the heels and she is noted swelling by the end of the day and her medial lateral ankle posteriorly.  Objective: Vital signs are stable alert and oriented x3 she has some mild posterior tibial tendinitis on palpation with resistance.  She also has pain on palpation medial calcaneal tubercles bilaterally.  Pulses are palpable neurologic sensorium is intact.  Assessment Planter fasciitis with some compensatory posterior tibial tendinitis and edema bilateral.  Plan: I injected the bilateral heels today 20 mg Kenalog 5 mg Marcaine point maximal tenderness.  Did not provide her with any oral medication.

## 2021-11-10 ENCOUNTER — Other Ambulatory Visit: Payer: Self-pay | Admitting: Family Medicine

## 2021-11-20 ENCOUNTER — Telehealth: Payer: Self-pay | Admitting: Family Medicine

## 2021-11-20 NOTE — Telephone Encounter (Signed)
Please schedule OV for pt with available provider.

## 2021-11-20 NOTE — Telephone Encounter (Signed)
See PCP within 24 Hours  Patient Name: Olivia Bender Gender: Female DOB: 1950-11-17 Age: 71 Y 86 M 16 D Return Phone Number: [phone] (Primary), [phone] (Secondary) [place] [address] Client [facility] [place] [facility] [place] [facility] [place] [facility] Provider Garret Reddish- MD Contact Type Call Who Is Calling Patient / Member / Family / Caregiver Call Type Triage / Clinical Relationship To Patient Self Return Phone Number [phone] (Primary) Chief Complaint Headache Reason for Call Symptomatic / Request for [place] states that she has a fever but unsure of temp, cough and sore throat. She also has a headache. Translation No Nurse Assessment Nurse: Thad Ranger, RN, Denise Date/Time (Eastern Time): 11/20/2021 3:12:51 PM Confirm and document reason for call. If symptomatic, describe symptoms. ---Caller states that she has a fever but unsure of temp, cough and sore throat. She also has a headache. Does the patient have any new or worsening symptoms? ---Yes Will a triage be completed? ---Yes Related visit to physician within the last 2 weeks? ---No Does the PT have any chronic conditions? (i.e. diabetes, asthma, this includes High risk factors for pregnancy, etc.) ---No Is this a behavioral health or substance abuse call? ---No  Guidelines Guideline Title Affirmed Question Affirmed Notes Nurse Date/Time (Eastern Time) Common Cold [1] Fever returns after gone for over 24 hours AND [2] symptoms worse or not improved Carmon, RN, Denise 11/20/2021 3:13:28 PM Disp. Time Eilene Ghazi Time) Disposition Final User 11/20/2021 3:15:46 PM See PCP within 24 Hours Yes Carmon, RN, Yevette Edwards Disagree/Comply Comply Caller Understands Yes PreDisposition Call Doctor   Care Advice Given Per Guideline SEE PCP WITHIN 24 HOURS: GENERAL CARE ADVICE FOR FEVER: * Drink  extra fluids to prevent dehydration. Cool or cold fluids are best for fever. * Keep room at a comfortable temperature, perhaps slightly on the cool side. * Avoid excessive clothing or blankets. Dress in 1 layer of lightweight clothing and sleep with 1 light blanket. FEVER MEDICINES: * For fevers above 101 F (38.3 C) take either acetaminophen or ibuprofen. * ACETAMINOPHEN - EXTRA STRENGTH TYLENOL: Take 1,000 mg (two 500 mg pills) every 6 to 8 hours as needed. Each Extra Strength Tylenol pill has 500 mg of acetaminophen. The most you should take is 6 pills a day (3,000 mg total). Note: In [place], the maximum is 8 pills a day (4,000 mg total). * IBUPROFEN (E.G., MOTRIN, ADVIL): Take 400 mg (two 200 mg pills) by mouth every 6 hours. The most you should take is 6 pills a day (1,200 mg total). FOR A RUNNY NOSE - BLOW YOUR NOSE: * Nasal mucus and discharge help wash viruses and bacteria out of the nose and sinuses. NASAL WASHES FOR A STUFFY NOSE: * Introduction: Saline (salt water) nasal irrigation (nasal wash) is an effective and simple home remedy for treating stuffy nose and sinus congestion. The nose can be irrigated by pouring, spraying, or squirting salt water into the nose and then letting it run back out. * How it Helps: The salt water rinses out excess mucus and washes out any irritants (dust, allergens) that might be present. It also moistens the nasal cavity. HUMIDIFIER: * If the air is dry, use a humidifier in the bedroom. CALL BACK IF: * You have difficulty breathing * You become worse CARE ADVICE given per Common Cold (Adult) guideline. Referrals REFERRED TO PCP OFFICE

## 2021-11-20 NOTE — Telephone Encounter (Signed)
Patient's husband called to request an OV with Dr. Yong Channel due to patient experiencing a worsening cough for one week, subjective fever, and drainage. Pt does have a hx of asthma. To be on the safe side I sent patient to triage for further evaluation.

## 2021-11-20 NOTE — Telephone Encounter (Signed)
Pt states PCP triage nurse instructed to see PCP within 24 hours.  Pt is schedule with [facility] provider [facility] 06/21  Awaiting follow up notes from PCP triage nurse.

## 2021-11-21 ENCOUNTER — Encounter: Payer: Self-pay | Admitting: Family

## 2021-11-21 ENCOUNTER — Ambulatory Visit (INDEPENDENT_AMBULATORY_CARE_PROVIDER_SITE_OTHER): Payer: Medicare Other | Admitting: Family

## 2021-11-21 VITALS — BP 134/83 | HR 81 | Temp 97.8°F | Ht 61.0 in | Wt 199.4 lb

## 2021-11-21 DIAGNOSIS — J020 Streptococcal pharyngitis: Secondary | ICD-10-CM

## 2021-11-21 DIAGNOSIS — J4520 Mild intermittent asthma, uncomplicated: Secondary | ICD-10-CM

## 2021-11-21 LAB — POCT RAPID STREP A (OFFICE): Rapid Strep A Screen: POSITIVE — AB

## 2021-11-21 LAB — POC COVID19 BINAXNOW: SARS Coronavirus 2 Ag: NEGATIVE

## 2021-11-21 MED ORDER — AMOXICILLIN 500 MG PO CAPS: 500.0000 mg | ORAL_CAPSULE | Freq: Two times a day (BID) | ORAL | 0 refills | Status: AC

## 2021-11-21 NOTE — Progress Notes (Signed)
Subjective:     Patient ID: Olivia Bender, female    DOB: 20-May-1951, 71 y.o.   MRN: [id number]  Chief Complaint  Patient presents with   Sore Throat    Pt c/o sore throat, fever(yesterday) and dry productive cough for about 2 days. Headache started yesterday and today. Has tried nasal spray and Claritin.     HPI: Sore Throat: Patient complains of sore throat. Associated symptoms include low grade fevers, dry cough, pain while swallowing, sinus and nasal congestion, and sore throat.Onset of symptoms was 2 days ago, gradually worsening since that time. She is drinking moderate amounts of fluids. She has had recent close exposure to someone with proven streptococcal pharyngitis.   Assessment & Plan:   Problem List Items Addressed This Visit       Respiratory   Mild intermittent asthma - possible exacerbation due to current viral illness, lungs clear today,  advised to restart Albuterol rescue inhaler at least 2-3 times per day for the next 3-5d, call office if sx are not improving.   Other Visit Diagnoses     Strep pharyngitis    -  Primary AMOX sent in, advised on use & SE, home remedies to treat sore throat pain.    Relevant Medications   amoxicillin (AMOXIL) 500 MG capsule   Other Relevant Orders   POC COVID-19 (Completed)   POCT rapid strep A (Completed)      Outpatient Medications Prior to Visit  Medication Sig Dispense Refill   albuterol (VENTOLIN HFA) 108 (90 Base) MCG/ACT inhaler      amLODipine (NORVASC) 2.5 MG tablet TAKE 1 TABLET BY MOUTH EVERY DAY 90 tablet 3   aspirin 81 MG EC tablet Take 81 mg by mouth daily.     Azelastine HCl 137 MCG/SPRAY SOLN Place into the nose.     cyclobenzaprine (FLEXERIL) 10 MG tablet Take 10 mg by mouth 3 (three) times daily as needed.     Ferrous Sulfate (IRON PO) Take by mouth. 3 times a week     levocetirizine (XYZAL) 5 MG tablet Take 5 mg by mouth every evening. Prn only     levothyroxine (SYNTHROID) 75 MCG tablet TAKE 1  TABLET BY MOUTH EVERY DAY 90 tablet 3   omeprazole (PRILOSEC OTC) 20 MG tablet Take 20 mg by mouth as needed.     sertraline (ZOLOFT) 50 MG tablet TAKE 1 TABLET BY MOUTH EVERY DAY 90 tablet 3   triamcinolone (NASACORT) 55 MCG/ACT AERO nasal inhaler Place 2 sprays into the nose daily.     VITAMIN D PO Take by mouth.     guaiFENesin-codeine (CHERATUSSIN AC) 100-10 MG/5ML syrup Take 10 mLs by mouth at bedtime as needed for cough or congestion. (Patient not taking: Reported on 11/21/2021) 118 mL 0   PRALUENT 150 MG/ML SOAJ INJECT 150 MG INTO THE SKIN EVERY 14 (FOURTEEN) DAYS. (Patient not taking: Reported on 11/21/2021) 6 mL 3   No facility-administered medications prior to visit.    Past Medical History:  Diagnosis Date   Allergy    Anxiety    Arthritis    Constipation    Enlarged liver    Generalized headaches    since teenage years- usually can get away with tylenol   Hyperlipidemia    Osteopenia    Thyroid disease    hypothyroidism    Past Surgical History:  Procedure Laterality Date   arthroscopic left knee surgery     DILATION AND CURETTAGE OF UTERUS  monthlong period related. everything was ok   NASAL SINUS SURGERY  1986    Allergies  Allergen Reactions   Tetanus Toxoids Anaphylaxis   Latex Rash   Ezetimibe Other (See Comments)    Myalgia   Statins Other (See Comments)    Lipitor, Crestor, Livalo -myalgias       Objective:    Physical Exam Vitals and nursing note reviewed.  Constitutional:      Appearance: Normal appearance.  HENT:     Right Ear: Tympanic membrane and ear canal normal.     Left Ear: Tympanic membrane and ear canal normal.     Mouth/Throat:     Mouth: Mucous membranes are moist.     Pharynx: Posterior oropharyngeal erythema and uvula swelling present. No pharyngeal swelling or oropharyngeal exudate.  Cardiovascular:     Rate and Rhythm: Normal rate and regular rhythm.  Pulmonary:     Effort: Pulmonary effort is normal.     Breath  sounds: Normal breath sounds.  Musculoskeletal:        General: Normal range of motion.  Lymphadenopathy:     Cervical: Cervical adenopathy present.  Skin:    General: Skin is warm and dry.  Neurological:     Mental Status: She is alert.  Psychiatric:        Mood and Affect: Mood normal.        Behavior: Behavior normal.     BP 134/83 (BP Location: Right Arm, Patient Position: Sitting, Cuff Size: Large)   Pulse 81   Temp 97.8 F (36.6 C) (Temporal)   Ht '5\' 1"'$  (1.549 m)   Wt 199 lb 6 oz (90.4 kg)   SpO2 97%   BMI 37.67 kg/m  Wt Readings from Last 3 Encounters:  11/21/21 199 lb 6 oz (90.4 kg)  06/27/21 201 lb (91.2 kg)  04/25/21 200 lb 9.6 oz (91 kg)        Meds ordered this encounter  Medications   amoxicillin (AMOXIL) 500 MG capsule    Sig: Take 1 capsule (500 mg total) by mouth 2 (two) times daily for 10 days.    Dispense:  20 capsule    Refill:  0    Order Specific Question:   Supervising Provider    Answer:   ANDY, CAMILLE L [[id number]]    Jeanie Sewer, NP

## 2021-11-21 NOTE — Patient Instructions (Addendum)
It was very nice to see you today!  I have sent over Amoxicillin to your pharmacy to treat your strep throat. Be sure to take all of the pills.  OK to take Ibuprofen 400-'600mg'$  2 times per day for sore throat pain, swelling, and fever. Gargle with warm salt water several times per day. OK to use over the counter Chloraseptic spray and/or throat lozenges as needed.  Drink plenty of water!     PLEASE NOTE:  If you had any lab tests please let us know if you have not heard back within a few days. You may see your results on MyChart before we have a chance to review them but we will give you a call once they are reviewed by [place]. If we ordered any referrals today, please let us know if you have not heard from their office within the next week.

## 2021-12-07 DIAGNOSIS — Z1231 Encounter for screening mammogram for malignant neoplasm of breast: Secondary | ICD-10-CM | POA: Diagnosis not present

## 2021-12-07 LAB — HM MAMMOGRAPHY

## 2022-02-25 ENCOUNTER — Encounter: Payer: Self-pay | Admitting: *Deleted

## 2022-02-28 ENCOUNTER — Ambulatory Visit (INDEPENDENT_AMBULATORY_CARE_PROVIDER_SITE_OTHER): Payer: Medicare Other

## 2022-02-28 VITALS — Wt 199.0 lb

## 2022-02-28 DIAGNOSIS — Z0000 Encounter for general adult medical examination without abnormal findings: Secondary | ICD-10-CM | POA: Diagnosis not present

## 2022-02-28 NOTE — Patient Instructions (Signed)
Olivia Bender , Thank you for taking time to come for your Medicare Wellness Visit. I appreciate your ongoing commitment to your health goals. Please review the following plan we discussed and let me know if I can assist you in the future.   These are the goals we discussed:  Goals      Patient Stated     Lose weight      Patient Stated     Lose weight      Patient Stated     Lose weight      Track and Manage My Blood Pressure-Hypertension     Timeframe:  Long-Range Goal Priority:  High Follow Up Date 12/2020  - check blood pressure weekly    Why is this important?   You won't feel high blood pressure, but it can still hurt your blood vessels.  High blood pressure can cause heart or kidney problems. It can also cause a stroke.  Making lifestyle changes like losing a little weight or eating less salt will help.  Checking your blood pressure at home and at different times of the day can help to control blood pressure.  If the doctor prescribes medicine remember to take it the way the doctor ordered.  Call the office if you cannot afford the medicine or if there are questions about it.     Notes:   12/26/20 - Amlodipine 2.'5mg'$  recently added - Bps have improved        This is a list of the screening recommended for you and due dates:  Health Maintenance  Topic Date Due   Flu Shot  01/01/2022   Tetanus Vaccine  05/15/2022   Mammogram  12/08/2022   Colon Cancer Screening  12/07/2024   Pneumonia Vaccine  Completed   DEXA scan (bone density measurement)  Completed   Hepatitis C Screening: USPSTF Recommendation to screen - Ages 79-79 yo.  Completed   Zoster (Shingles) Vaccine  Completed   HPV Vaccine  Aged Out   COVID-19 Vaccine  Discontinued    Advanced directives: Please bring a copy of your health care power of attorney and living will to the office at your convenience.   Conditions/risks identified: lose weight  Next appointment: Follow up in one year for your annual  wellness visit    Preventive Care 65 Years and Older, Female Preventive care refers to lifestyle choices and visits with your health care provider that can promote health and wellness. What does preventive care include? A yearly physical exam. This is also called an annual well check. Dental exams once or twice a year. Routine eye exams. Ask your health care provider how often you should have your eyes checked. Personal lifestyle choices, including: Daily care of your teeth and gums. Regular physical activity. Eating a healthy diet. Avoiding tobacco and drug use. Limiting alcohol use. Practicing safe sex. Taking low-dose aspirin every day. Taking vitamin and mineral supplements as recommended by your health care provider. What happens during an annual well check? The services and screenings done by your health care provider during your annual well check will depend on your age, overall health, lifestyle risk factors, and family history of disease. Counseling  Your health care provider may ask you questions about your: Alcohol use. Tobacco use. Drug use. Emotional well-being. Home and relationship well-being. Sexual activity. Eating habits. History of falls. Memory and ability to understand (cognition). Work and work Statistician. Reproductive health. Screening  You may have the following tests or measurements: Height, weight,  and BMI. Blood pressure. Lipid and cholesterol levels. These may be checked every 5 years, or more frequently if you are over 82 years old. Skin check. Lung cancer screening. You may have this screening every year starting at age 30 if you have a 30-pack-year history of smoking and currently smoke or have quit within the past 15 years. Fecal occult blood test (FOBT) of the stool. You may have this test every year starting at age 11. Flexible sigmoidoscopy or colonoscopy. You may have a sigmoidoscopy every 5 years or a colonoscopy every 10 years starting  at age 80. Hepatitis C blood test. Hepatitis B blood test. Sexually transmitted disease (STD) testing. Diabetes screening. This is done by checking your blood sugar (glucose) after you have not eaten for a while (fasting). You may have this done every 1-3 years. Bone density scan. This is done to screen for osteoporosis. You may have this done starting at age 58. Mammogram. This may be done every 1-2 years. Talk to your health care provider about how often you should have regular mammograms. Talk with your health care provider about your test results, treatment options, and if necessary, the need for more tests. Vaccines  Your health care provider may recommend certain vaccines, such as: Influenza vaccine. This is recommended every year. Tetanus, diphtheria, and acellular pertussis (Tdap, Td) vaccine. You may need a Td booster every 10 years. Zoster vaccine. You may need this after age 77. Pneumococcal 13-valent conjugate (PCV13) vaccine. One dose is recommended after age 13. Pneumococcal polysaccharide (PPSV23) vaccine. One dose is recommended after age 105. Talk to your health care provider about which screenings and vaccines you need and how often you need them. This information is not intended to replace advice given to you by your health care provider. Make sure you discuss any questions you have with your health care provider. Document Released: 06/16/2015 Document Revised: 02/07/2016 Document Reviewed: 03/21/2015 Elsevier Interactive Patient Education  2017 [employer] Prevention in the Home Falls can cause injuries. They can happen to people of all ages. There are many things you can do to make your home safe and to help prevent falls. What can I do on the outside of my home? Regularly fix the edges of walkways and driveways and fix any cracks. Remove anything that might make you trip as you walk through a door, such as a raised step or threshold. Trim any bushes or trees on  the path to your home. Use bright outdoor lighting. Clear any walking paths of anything that might make someone trip, such as rocks or tools. Regularly check to see if handrails are loose or broken. Make sure that both sides of any steps have handrails. Any raised decks and porches should have guardrails on the edges. Have any leaves, snow, or ice cleared regularly. Use sand or salt on walking paths during winter. Clean up any spills in your garage right away. This includes oil or grease spills. What can I do in the bathroom? Use night lights. Install grab bars by the toilet and in the tub and shower. Do not use towel bars as grab bars. Use non-skid mats or decals in the tub or shower. If you need to sit down in the shower, use a plastic, non-slip stool. Keep the floor dry. Clean up any water that spills on the floor as soon as it happens. Remove soap buildup in the tub or shower regularly. Attach bath mats securely with double-sided non-slip rug tape. Do  not have throw rugs and other things on the floor that can make you trip. What can I do in the bedroom? Use night lights. Make sure that you have a light by your bed that is easy to reach. Do not use any sheets or blankets that are too big for your bed. They should not hang down onto the floor. Have a firm chair that has side arms. You can use this for support while you get dressed. Do not have throw rugs and other things on the floor that can make you trip. What can I do in the kitchen? Clean up any spills right away. Avoid walking on wet floors. Keep items that you use a lot in easy-to-reach places. If you need to reach something above you, use a strong step stool that has a grab bar. Keep electrical cords out of the way. Do not use floor polish or wax that makes floors slippery. If you must use wax, use non-skid floor wax. Do not have throw rugs and other things on the floor that can make you trip. What can I do with my stairs? Do  not leave any items on the stairs. Make sure that there are handrails on both sides of the stairs and use them. Fix handrails that are broken or loose. Make sure that handrails are as long as the stairways. Check any carpeting to make sure that it is firmly attached to the stairs. Fix any carpet that is loose or worn. Avoid having throw rugs at the top or bottom of the stairs. If you do have throw rugs, attach them to the floor with carpet tape. Make sure that you have a light switch at the top of the stairs and the bottom of the stairs. If you do not have them, ask someone to add them for you. What else can I do to help prevent falls? Wear shoes that: Do not have high heels. Have rubber bottoms. Are comfortable and fit you well. Are closed at the toe. Do not wear sandals. If you use a stepladder: Make sure that it is fully opened. Do not climb a closed stepladder. Make sure that both sides of the stepladder are locked into place. Ask someone to hold it for you, if possible. Clearly mark and make sure that you can see: Any grab bars or handrails. First and last steps. Where the edge of each step is. Use tools that help you move around (mobility aids) if they are needed. These include: Canes. Walkers. Scooters. Crutches. Turn on the lights when you go into a dark area. Replace any light bulbs as soon as they burn out. Set up your furniture so you have a clear path. Avoid moving your furniture around. If any of your floors are uneven, fix them. If there are any pets around you, be aware of where they are. Review your medicines with your doctor. Some medicines can make you feel dizzy. This can increase your chance of falling. Ask your doctor what other things that you can do to help prevent falls. This information is not intended to replace advice given to you by your health care provider. Make sure you discuss any questions you have with your health care provider. Document Released:  03/16/2009 Document Revised: 10/26/2015 Document Reviewed: 06/24/2014 Elsevier Interactive Patient Education  2017 [employer].

## 2022-02-28 NOTE — Progress Notes (Addendum)
Virtual Visit via Telephone Note  I connected with  Olivia Bender on 02/28/22 at  9:00 AM EDT by telephone and verified that I am speaking with the correct person using two identifiers.  Medicare Annual Wellness visit completed telephonically due to Covid-19 pandemic.   Persons participating in this call: This Health Coach and this patient.   Location: Patient: home Provider: office    I discussed the limitations, risks, security and privacy concerns of performing an evaluation and management service by telephone and the availability of in person appointments. The patient expressed understanding and agreed to proceed.  Unable to perform video visit due to video visit attempted and failed and/or patient does not have video capability.   Some vital signs may be absent or patient reported.   Willette Brace, LPN   Subjective:   Olivia Bender is a 71 y.o. female who presents for Medicare Annual (Subsequent) preventive examination.  Review of Systems     Cardiac Risk Factors include: advanced age (>42mn, >>29women);hypertension;dyslipidemia;obesity (BMI >30kg/m2)     Objective:    Today's Vitals   02/28/22 0903  Weight: 199 lb (90.3 kg)   Body mass index is 37.6 kg/m.     02/28/2022    9:00 AM 02/19/2021    8:58 AM 01/21/2020   11:34 AM 06/08/2019    5:43 PM 01/19/2019    1:50 PM 07/31/2016   10:43 AM 06/10/2016    9:37 AM  Advanced Directives  Does Patient Have a Medical Advance Directive? Yes Yes Yes No Yes No Yes  Type of [place]amed[place] Healthcare Power of Attorney Living will  Living will;Healthcare Power of Attorney  Living will;Healthcare Power of Attorney  Does patient want to make changes to medical advance directive?     No - Patient declined    Copy of [place]in Chart? No - copy requested No - copy requested   No - copy requested  No - copy requested  Would patient like information on creating a  medical advance directive?      No - Patient declined     Current Medications (verified) Outpatient Encounter Medications as of 02/28/2022  Medication Sig   albuterol (VENTOLIN HFA) 108 (90 Base) MCG/ACT inhaler    amLODipine (NORVASC) 2.5 MG tablet TAKE 1 TABLET BY MOUTH EVERY DAY   aspirin 81 MG EC tablet Take 81 mg by mouth daily.   Azelastine HCl 137 MCG/SPRAY SOLN Place into the nose.   cyclobenzaprine (FLEXERIL) 10 MG tablet Take 10 mg by mouth 3 (three) times daily as needed.   Ferrous Sulfate (IRON PO) Take by mouth. 3 times a week   levocetirizine (XYZAL) 5 MG tablet Take 5 mg by mouth every evening. Prn only   levothyroxine (SYNTHROID) 75 MCG tablet TAKE 1 TABLET BY MOUTH EVERY DAY   omeprazole (PRILOSEC OTC) 20 MG tablet Take 20 mg by mouth as needed.   sertraline (ZOLOFT) 50 MG tablet TAKE 1 TABLET BY MOUTH EVERY DAY   triamcinolone (NASACORT) 55 MCG/ACT AERO nasal inhaler Place 2 sprays into the nose daily.   VITAMIN D PO Take by mouth.   PRALUENT 150 MG/ML SOAJ INJECT 150 MG INTO THE SKIN EVERY 14 (FOURTEEN) DAYS. (Patient not taking: Reported on 11/21/2021)   [DISCONTINUED] guaiFENesin-codeine (CHERATUSSIN AC) 100-10 MG/5ML syrup Take 10 mLs by mouth at bedtime as needed for cough or congestion. (Patient not taking: Reported on 11/21/2021)   No facility-administered  encounter medications on file as of 02/28/2022.    Allergies (verified) Atorvastatin, Crestor [rosuvastatin], Molds & smuts, Pitavastatin, Tetanus toxoids, Latex, Ezetimibe, and Statins   History: Past Medical History:  Diagnosis Date   Allergy    Anxiety    Arthritis    Constipation    Enlarged liver    Generalized headaches    since teenage years- usually can get away with tylenol   Hyperlipidemia    Osteopenia    Thyroid disease    hypothyroidism   Past Surgical History:  Procedure Laterality Date   arthroscopic left knee surgery     DILATION AND CURETTAGE OF UTERUS     monthlong period related.  everything was ok   NASAL SINUS SURGERY  1986   Family History  Problem Relation Age of Onset   Stroke Mother        76. never smoker   Hypertension Mother    Other Father        killed in car wreck 2012   Prostate cancer Father    Obesity Sister    Kidney cancer Son    Heart disease Maternal Grandmother    Heart disease Maternal Grandfather    Social History   Socioeconomic History   Marital status: Married    Spouse name: Not on file   Number of children: Not on file   Years of education: Not on file   Highest education level: Not on file  Occupational History   Occupation: Retired  Tobacco Use   Smoking status: Never   Smokeless tobacco: Never  Substance and Sexual Activity   Alcohol use: No   Drug use: Never   Sexual activity: Not on file  Other Topics Concern   Not on file  Social History Narrative   Married. 5 kids total between her and husband.  One son is a Mudlogger.      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        Hobbies: travel but hsuband ill   Social Determinants of Health   Financial Resource Strain: Low Risk  (02/28/2022)   Overall Financial Resource Strain (CARDIA)    Difficulty of Paying Living Expenses: Not hard at all  Food Insecurity: No Food Insecurity (02/28/2022)   Hunger Vital Sign    Worried About Running Out of Food in the Last Year: Never true    [place] in the Last Year: Never true  Transportation Needs: No Transportation Needs (02/28/2022)   PRAPARE - Hydrologist (Medical): No    Lack of Transportation (Non-Medical): No  Physical Activity: Inactive (02/28/2022)   Exercise Vital Sign    Days of Exercise per Week: 0 days    Minutes of Exercise per Session: 0 min  Stress: No Stress Concern Present (02/28/2022)   [place]    Feeling of Stress : Not at all  Social  Connections: [place] (02/28/2022)   Social Connection and Isolation Panel [NHANES]    Frequency of Communication with Friends and Family: More than three times a week    Frequency of Social Gatherings with Friends and Family: More than three times a week    Attends Religious Services: More than 4 times per year    Active Member of [employer] or Organizations: Yes    Attends Archivist Meetings: Never    Marital Status:  Married    Tobacco Counseling Counseling given: Not Answered   Clinical Intake:  Pre-visit preparation completed: Yes  Pain : No/denies pain     BMI - recorded: 37.6 Nutritional Status: BMI > 30  Obese Nutritional Risks: None Diabetes: No  How often do you need to have someone help you when you read instructions, pamphlets, or other written materials from your doctor or pharmacy?: 1 - Never  Diabetic?no  Interpreter Needed?: No  Information entered by :: Charlott Rakes, LPN   Activities of Daily Living    02/28/2022    9:02 AM  In your present state of health, do you have any difficulty performing the following activities:  Hearing? 0  Vision? 0  Difficulty concentrating or making decisions? 0  Walking or climbing stairs? 0  Dressing or bathing? 0  Doing errands, shopping? 0  Preparing Food and eating ? N  Using the Toilet? N  In the past six months, have you accidently leaked urine? N  Do you have problems with loss of bowel control? N  Managing your Medications? N  Managing your Finances? N  Housekeeping or managing your Housekeeping? N    Patient Care Team: Marin Olp, MD as PCP - General (Family Medicine) Buford Dresser, MD as PCP - Cardiology (Cardiology) Garrel Ridgel, DPM as Consulting Physician (Podiatry) Debara Pickett, Nadean Corwin, MD as Consulting Physician (Cardiology) Harold Hedge, Darrick Grinder, MD as Consulting Physician (Allergy and Immunology) Edythe Clarity, [facility] (Pharmacist)  Indicate any recent Medical  Services you may have received from other than [facility] providers in the past year (date may be approximate).     Assessment:   This is a routine wellness examination for Saramarie.  Hearing/Vision screen Hearing Screening - Comments:: Pt stated slight loss  Vision Screening - Comments:: Pt follows up with Dr Katy Fitch for annual eyey exams   Dietary issues and exercise activities discussed: Current Exercise Habits: The patient does not participate in regular exercise at present   Goals Addressed             This Visit's Progress    Patient Stated       Lose weight        Depression Screen    02/28/2022    9:00 AM 11/21/2021   11:32 AM 03/19/2021    9:41 AM 02/19/2021    8:56 AM 09/13/2020    9:31 AM 03/14/2020   10:30 AM 01/21/2020   11:33 AM  PHQ 2/9 Scores  PHQ - 2 Score 0 0 1 0 3 0 0  PHQ- 9 Score  0 1  12 0     Fall Risk    02/28/2022    9:01 AM 06/27/2021    3:47 PM 02/19/2021    8:59 AM 09/13/2020    9:31 AM 01/21/2020   11:35 AM  Fall Risk   Falls in the past year? 1 0 '1 1 1  '$ Number falls in past yr: '1  1 1 1  '$ Injury with Fall? 0  0 1 0  Comment     just sore  Risk for fall due to : Impaired vision;Impaired balance/gait No Fall Risks Impaired balance/gait  Impaired balance/gait;Impaired vision;History of fall(s)  Risk for fall due to: Comment     pt stated problems with dizzyness  Follow up Falls prevention discussed  Falls prevention discussed  Falls prevention discussed    FALL RISK PREVENTION PERTAINING TO THE HOME:  Any stairs in or around the home? Yes  If so, are there any without handrails? No  Home free of loose throw rugs in walkways, pet beds, electrical cords, etc? Yes  Adequate lighting in your home to reduce risk of falls? Yes   ASSISTIVE DEVICES UTILIZED TO PREVENT FALLS:  Life alert? No  Use of a cane, walker or w/c? No  Grab bars in the bathroom? No  Shower chair or bench in shower? Yes  Elevated toilet seat or a handicapped toilet? Yes    TIMED UP AND GO:  Was the test performed? No .   Cognitive Function:        02/28/2022    9:04 AM 02/19/2021    9:00 AM 01/21/2020   11:40 AM  6CIT Screen  What Year? 0 points 0 points 0 points  What month? 0 points 0 points 0 points  What time? 0 points 0 points   Count back from 20 0 points 0 points 0 points  Months in reverse 0 points 0 points 0 points  Repeat phrase 0 points 0 points 2 points  Total Score 0 points 0 points     Immunizations Immunization History  Administered Date(s) Administered   Fluad Quad(high Dose 65+) 03/14/2020, 02/19/2021   Influenza, High Dose Seasonal PF 04/20/2018, 05/20/2018, 03/04/2019, 06/25/2019   Influenza,inj,Quad PF,6+ Mos 05/22/2017   Influenza-Unspecified 04/17/2014   PFIZER(Purple Top)SARS-COV-2 Vaccination 12/17/2019, 01/05/2020, 01/06/2020, 06/08/2020   Pneumococcal Conjugate-13 07/28/2015   Pneumococcal Polysaccharide-23 07/14/2005, 04/18/2016, 04/18/2017, 11/21/2017, 05/20/2018, 06/25/2019   Zoster Recombinat (Shingrix) 04/20/2019, 09/15/2019   Zoster, Live 12/23/2011    TDAP status: Up to date  Flu Vaccine status: Due, Education has been provided regarding the importance of this vaccine. Advised may receive this vaccine at local pharmacy or Health Dept. Aware to provide a copy of the vaccination record if obtained from local pharmacy or Health Dept. Verbalized acceptance and understanding.  Pneumococcal vaccine status: Up to date  Covid-19 vaccine status: Completed vaccines  Qualifies for Shingles Vaccine? Yes   Zostavax completed Yes   Shingrix Completed?: Yes  Screening Tests Health Maintenance  Topic Date Due   INFLUENZA VACCINE  01/01/2022   TETANUS/TDAP  05/15/2022   MAMMOGRAM  12/08/2022   COLONOSCOPY (Pts 45-32yr Insurance coverage will need to be confirmed)  12/07/2024   Pneumonia Vaccine 72 Years old  Completed   DEXA SCAN  Completed   Hepatitis C Screening  Completed   Zoster Vaccines- Shingrix   Completed   HPV VACCINES  Aged Out   COVID-19 Vaccine  Discontinued    Health Maintenance  Health Maintenance Due  Topic Date Due   INFLUENZA VACCINE  01/01/2022    Colorectal cancer screening: Type of screening: Colonoscopy. Completed 12/08/19. Repeat every 5 years  Mammogram status: Completed 12/07/21. Repeat every year  Bone Density status: Completed 01/26/21. Results reflect: Bone density results: OSTEOPENIA. Repeat every 2 years.   Additional Screening:  Hepatitis C Screening:  Completed 02/24/14  Vision Screening: Recommended annual ophthalmology exams for early detection of glaucoma and other disorders of the eye. Is the patient up to date with their annual eye exam?  Yes  Who is the provider or what is the name of the office in which the patient attends annual eye exams? Dr GKaty Fitch If pt is not established with a provider, would they like to be referred to a provider to establish care? No .   Dental Screening: Recommended annual dental exams for proper oral hygiene  Community Resource Referral / Chronic Care Management: CRR required this  visit?  No   CCM required this visit?  No      Plan:     I have personally reviewed and noted the following in the patient's chart:   Medical and social history Use of alcohol, tobacco or illicit drugs  Current medications and supplements including opioid prescriptions. Patient is not currently taking opioid prescriptions. Functional ability and status Nutritional status Physical activity Advanced directives List of other physicians Hospitalizations, surgeries, and ER visits in previous 12 months Vitals Screenings to include cognitive, depression, and falls Referrals and appointments  In addition, I have reviewed and discussed with patient certain preventive protocols, quality metrics, and best practice recommendations. A written personalized care plan for preventive services as well as general preventive health recommendations  were provided to patient.     Willette Brace, LPN   2/99/3716   Nurse Notes: none

## 2022-03-06 DIAGNOSIS — H5010 Unspecified exotropia: Secondary | ICD-10-CM | POA: Diagnosis not present

## 2022-03-06 DIAGNOSIS — H2513 Age-related nuclear cataract, bilateral: Secondary | ICD-10-CM | POA: Diagnosis not present

## 2022-03-06 DIAGNOSIS — H532 Diplopia: Secondary | ICD-10-CM | POA: Diagnosis not present

## 2022-03-06 DIAGNOSIS — H10413 Chronic giant papillary conjunctivitis, bilateral: Secondary | ICD-10-CM | POA: Diagnosis not present

## 2022-03-22 ENCOUNTER — Encounter: Payer: Self-pay | Admitting: Family Medicine

## 2022-03-22 ENCOUNTER — Ambulatory Visit (INDEPENDENT_AMBULATORY_CARE_PROVIDER_SITE_OTHER): Payer: Medicare Other | Admitting: Family Medicine

## 2022-03-22 VITALS — BP 132/78 | HR 78 | Temp 98.1°F | Ht 61.0 in | Wt 203.4 lb

## 2022-03-22 DIAGNOSIS — J029 Acute pharyngitis, unspecified: Secondary | ICD-10-CM

## 2022-03-22 DIAGNOSIS — R252 Cramp and spasm: Secondary | ICD-10-CM | POA: Diagnosis not present

## 2022-03-22 DIAGNOSIS — R7303 Prediabetes: Secondary | ICD-10-CM | POA: Diagnosis not present

## 2022-03-22 DIAGNOSIS — E785 Hyperlipidemia, unspecified: Secondary | ICD-10-CM

## 2022-03-22 DIAGNOSIS — E039 Hypothyroidism, unspecified: Secondary | ICD-10-CM | POA: Diagnosis not present

## 2022-03-22 DIAGNOSIS — I700 Atherosclerosis of aorta: Secondary | ICD-10-CM | POA: Diagnosis not present

## 2022-03-22 DIAGNOSIS — I10 Essential (primary) hypertension: Secondary | ICD-10-CM | POA: Diagnosis not present

## 2022-03-22 DIAGNOSIS — Z8639 Personal history of other endocrine, nutritional and metabolic disease: Secondary | ICD-10-CM | POA: Diagnosis not present

## 2022-03-22 DIAGNOSIS — F325 Major depressive disorder, single episode, in full remission: Secondary | ICD-10-CM | POA: Diagnosis not present

## 2022-03-22 LAB — LIPID PANEL
Cholesterol: 243 mg/dL — ABNORMAL HIGH (ref 0–200)
HDL: 46.1 mg/dL (ref >39.00)
LDL Cholesterol: 163 mg/dL — ABNORMAL HIGH (ref 0–99)
NonHDL: 197.39
Total CHOL/HDL Ratio: 5
Triglycerides: 172 mg/dL — ABNORMAL HIGH (ref 0.0–149.0)
VLDL: 34.4 mg/dL (ref 0.0–40.0)

## 2022-03-22 LAB — CBC WITH DIFFERENTIAL/PLATELET
Basophils Absolute: 0 10*3/uL (ref 0.0–0.1)
Basophils Relative: 0.5 % (ref 0.0–3.0)
Eosinophils Absolute: 0.2 10*3/uL (ref 0.0–0.7)
Eosinophils Relative: 2.2 % (ref 0.0–5.0)
HCT: 36 % (ref 36.0–46.0)
Hemoglobin: 11.9 g/dL — ABNORMAL LOW (ref 12.0–15.0)
Lymphocytes Relative: 23.3 % (ref 12.0–46.0)
Lymphs Abs: 1.8 10*3/uL (ref 0.7–4.0)
MCHC: 33.2 g/dL (ref 30.0–36.0)
MCV: 95.2 fl (ref 78.0–100.0)
Monocytes Absolute: 0.9 10*3/uL (ref 0.1–1.0)
Monocytes Relative: 11.6 % (ref 3.0–12.0)
Neutro Abs: 4.7 10*3/uL (ref 1.4–7.7)
Neutrophils Relative %: 62.4 % (ref 43.0–77.0)
Platelets: 245 10*3/uL (ref 150.0–400.0)
RBC: 3.78 Mil/uL — ABNORMAL LOW (ref 3.87–5.11)
RDW: 13.8 % (ref 11.5–15.5)
WBC: 7.5 10*3/uL (ref 4.0–10.5)

## 2022-03-22 LAB — IBC + FERRITIN
Ferritin: 176.6 ng/mL (ref 10.0–291.0)
Iron: 72 ug/dL (ref 42–145)
Saturation Ratios: 21.5 % (ref 20.0–50.0)
TIBC: 334.6 ug/dL (ref 250.0–450.0)
Transferrin: 239 mg/dL (ref 212.0–360.0)

## 2022-03-22 LAB — TSH: TSH: 2.44 u[IU]/mL (ref 0.35–5.50)

## 2022-03-22 LAB — COMPREHENSIVE METABOLIC PANEL
ALT: 25 U/L (ref 0–35)
AST: 32 U/L (ref 0–37)
Albumin: 4.2 g/dL (ref 3.5–5.2)
Alkaline Phosphatase: 63 U/L (ref 39–117)
BUN: 15 mg/dL (ref 6–23)
CO2: 26 mEq/L (ref 19–32)
Calcium: 9.4 mg/dL (ref 8.4–10.5)
Chloride: 106 mEq/L (ref 96–112)
Creatinine, Ser: 0.84 mg/dL (ref 0.40–1.20)
GFR: 69.69 mL/min (ref >60.00)
Glucose, Bld: 85 mg/dL (ref 70–99)
Potassium: 4.2 mEq/L (ref 3.5–5.1)
Sodium: 140 mEq/L (ref 135–145)
Total Bilirubin: 0.4 mg/dL (ref 0.2–1.2)
Total Protein: 7.5 g/dL (ref 6.0–8.3)

## 2022-03-22 LAB — MAGNESIUM: Magnesium: 2.2 mg/dL (ref 1.5–2.5)

## 2022-03-22 LAB — HEMOGLOBIN A1C: Hgb A1c MFr Bld: 5.8 % (ref 4.6–6.5)

## 2022-03-22 LAB — POCT RAPID STREP A (OFFICE): Rapid Strep A Screen: POSITIVE — AB

## 2022-03-22 MED ORDER — AMOXICILLIN 500 MG PO CAPS: 500.0000 mg | ORAL_CAPSULE | Freq: Two times a day (BID) | ORAL | 0 refills | Status: AC

## 2022-03-22 MED ORDER — BENZONATATE 100 MG PO CAPS: 100.0000 mg | ORAL_CAPSULE | Freq: Two times a day (BID) | ORAL | 0 refills | Status: DC | PRN

## 2022-03-22 NOTE — Addendum Note (Signed)
Addended by: Marin Olp on: 03/22/2022 05:57 PM   Modules accepted: Level of Service

## 2022-03-22 NOTE — Patient Instructions (Addendum)
Schedule Olivia Bender with Olivia Bender at 4: 20 pm-  I am willing to see him after my last patient scheduled today - if he cannot make it in person we can do virtual but ideally would do the test  since her main concern is the cramping/pulling up of toes- encouraged her to try light amount of mustard when this occurs or before anticipating it occurring at night (would watch blood pressure with this as could raise sodium) - if this does not help (plus we will check electrolytes today), discussed sports medicine referral  Schedule cardiology follow up for cholesterol   Amoxicillin for strep throat for 10 days- if not improving return to see Olivia Bender  Please stop by lab before you go If you have mychart- we will send your results within 3 business days of Olivia Bender receiving them.  If you do not have mychart- we will call you about results within 5 business days of Olivia Bender receiving them.  *please also note that you will see labs on mychart as soon as they post. I will later go in and write notes on them- will say "notes from Dr. Yong Channel"   Recommended follow up: Return in about 6 months (around 09/21/2022) for followup or sooner if needed.Schedule b4 you leave.

## 2022-03-22 NOTE — Progress Notes (Addendum)
Phone [phone] In person visit   Subjective:   Olivia Bender is a 71 y.o. year old very pleasant female patient who presents for/with See problem oriented charting Chief Complaint  Patient presents with   Annual Exam- not CPE    Not fasting w/ Labs   Sinus Problem    Pt c/o sore throat, sneezing and nasal congestion since last night.  Pt did a at  home COVID test today negative. Has tried coricidin cough syrup.    Past Medical History-  Patient Active Problem List   Diagnosis Date Noted   Chronic allergic conjunctivitis 11/05/2021    Priority: Medium    Mild intermittent asthma 11/05/2021    Priority: Medium    Essential hypertension 12/10/2019    Priority: Medium    Aortic atherosclerosis ([place]) 12/11/2018    Priority: Medium    Major depression in full remission ([place]) 05/18/2018    Priority: Medium    Asthma 11/21/2017    Priority: Medium    Generalized headaches     Priority: Medium    Fatty infiltration of liver 12/30/2014    Priority: Medium    Prediabetes 06/30/2014    Priority: Medium    Hypothyroidism 12/01/2010    Priority: Medium    Hyperlipidemia 12/01/2010    Priority: Medium    Osteopenia 12/01/2010    Priority: Medium    GE reflux 12/01/2010    Priority: Medium    Trochanteric bursitis of left hip 12/11/2015    Priority: Low   Statin intolerance 06/30/2014    Priority: Low   Left knee pain 06/30/2014    Priority: Low   Allergic rhinitis 12/01/2010    Priority: Low   Pulmonary nodule/lesion, solitary 02/21/2020    Medications- reviewed and updated Current Outpatient Medications  Medication Sig Dispense Refill   albuterol (VENTOLIN HFA) 108 (90 Base) MCG/ACT inhaler      amLODipine (NORVASC) 2.5 MG tablet TAKE 1 TABLET BY MOUTH EVERY DAY 90 tablet 3   amoxicillin (AMOXIL) 500 MG capsule Take 1 capsule (500 mg total) by mouth 2 (two) times daily for 10 days. 20 capsule 0   aspirin 81 MG EC tablet Take 81 mg by mouth daily.     Azelastine  HCl 137 MCG/SPRAY SOLN Place into the nose.     benzonatate (TESSALON) 100 MG capsule Take 1 capsule (100 mg total) by mouth 2 (two) times daily as needed for up to 20 days for cough. 30 capsule 0   cyclobenzaprine (FLEXERIL) 10 MG tablet Take 10 mg by mouth 3 (three) times daily as needed.     Ferrous Sulfate (IRON PO) Take by mouth. 3 times a week     levocetirizine (XYZAL) 5 MG tablet Take 5 mg by mouth every evening. Prn only     levothyroxine (SYNTHROID) 75 MCG tablet TAKE 1 TABLET BY MOUTH EVERY DAY 90 tablet 3   omeprazole (PRILOSEC OTC) 20 MG tablet Take 20 mg by mouth as needed.     PRALUENT 150 MG/ML SOAJ INJECT 150 MG INTO THE SKIN EVERY 14 (FOURTEEN) DAYS. 6 mL 3   sertraline (ZOLOFT) 50 MG tablet TAKE 1 TABLET BY MOUTH EVERY DAY 90 tablet 3   triamcinolone (NASACORT) 55 MCG/ACT AERO nasal inhaler Place 2 sprays into the nose daily.     VITAMIN D PO Take by mouth.     No current facility-administered medications for this visit.     Objective:  BP 132/78   Pulse 78  Temp 98.1 F (36.7 C) (Temporal)   Ht '5\' 1"'$  (1.549 m)   Wt 203 lb 6 oz (92.3 kg)   SpO2 98%   BMI 38.43 kg/m  Gen: NAD, resting comfortably Erythematous pharynx and tonsils with tender anterior cervical lymphadenopathy CV: RRR no murmurs rubs or gallops Lungs: CTAB no crackles, wheeze, rhonchi Abdomen: soft/nontender/nondistended/normal bowel sounds.  Ext: minimal edema Skin: warm, dry  Results for orders placed or performed in visit on 03/22/22 (from the past 24 hour(s))  POCT rapid strep A     Status: Abnormal   Collection Time: 03/22/22 10:50 AM  Result Value Ref Range   Rapid Strep A Screen Positive (A) Negative       Assessment and Plan   # Sinus congestion/sore throat S:2 days ago started with sore throat/congestion/cough. Stable in course.  Was at [employer] until Monday . Low grade temp this AM she believes A/P: positive strep today -treat with amoxicillin '500mg'$  BID for 10 days  #  back/leg issues S:2nd and 3rd toe cramp/draw up- gets husband to basically massage/manipulate and seems to help. Has seen Dr. Milinda Pointer with podiatry- had shots in feet previously and wondered if this could be coming form her back.  X-rays in 2001 showed degenerative changes of lumbar spine including also noted subluxation of L4 about 4 mm posterior -has not tried mustard/electrolytes A/P: since her main concern is the cramping/pulling up of toes- encouraged her to try light amount of mustard when this occurs or before anticipating it occurring (would watch blood pressure with this as could raise sodium) - if this does not help (plus we will check electrolytes today), discussed sports medicine referral - will check magnesium as well - some left low back pain as well- has some bands and core exercises she may restart- I think that's a good plan  #hypertension S: medication: amlodipine 2.5 mg Home readings #s: 130s at home but spent time with friend last night and stayed night with  BP Readings from Last 3 Encounters:  03/22/22 132/78  11/21/21 134/83  06/27/21 (!) 146/81  A/P: much improved on repeat- continue current meds  #hypothyroidism S: compliant On thyroid medication- levothyroxine 75 mcg Lab Results  Component Value Date   TSH 2.72 03/19/2021   A/P:she has noted hair thinning- wants to update tsh- for now continue current meds   #hyperlipidemia with aortic atherosclerosis S: Medication: praluent through [facility] cardiology Lab Results  Component Value Date   CHOL 137 12/15/2020   HDL 43 12/15/2020   LDLCALC 64 12/15/2020   LDLDIRECT 184.0 11/21/2017   TRIG 177 (H) 12/15/2020   CHOLHDL 3.2 12/15/2020   A/P: she has had issues with this being covered and very expensive- goes in and out of grant coverage- currently off for 4-5 months- update lipids and she plans to sche[place]le follow up with cardiology  Aortic atherosclerosis- BP controlled, want to prevent Dm, and hoping can get on more  regular praluent. Presumed stable  #fatty liver- Encouraged need for healthy eating, regular exercise, weight loss. Up 4 lbs from June- under a lot of stress right now with friend dying. Weight watchers in the past- 50 lbs- check LFTs today. Encouraged walking- feel she could do this Wt Readings from Last 3 Encounters:  03/22/22 203 lb 6 oz (92.3 kg)  02/28/22 199 lb (90.3 kg)  11/21/21 199 lb 6 oz (90.4 kg)   # Depression S: Medication: sertraline 50 mg    03/22/2022   10:30 AM 02/28/2022  9:00 AM 11/21/2021   11:32 AM  Depression screen PHQ 2/9  Decreased Interest 0 0 0  Down, Depressed, Hopeless 1 0 0  PHQ - 2 Score 1 0 0  Altered sleeping 0  0  Tired, decreased energy 1  0  Change in appetite 1  0  Feeling bad or failure about yourself  0  0  Trouble concentrating 0  0  Moving slowly or fidgety/restless 0  0  Suicidal thoughts 0  0  PHQ-9 Score 3  0  Difficult doing work/chores Not difficult at all  Not difficult at all   A/P: reasonable control/full remisison- continue current meds  # Hyperglycemia/insulin resistance/prediabetes S:  Medication: none Exercise and diet- see above about fatty liver discussion Lab Results  Component Value Date   HGBA1C 5.8 03/19/2021   HGBA1C 5.4 12/10/2019   HGBA1C 5.5 05/18/2018  A/P: hopefully stable or improved (but weight up some)- update a1c today. Continue current meds for now  # Low Bone density (formerly osteopenia) Last DEXA: 01/26/21 -improved actually in normal range- recheck 2024- encouraged walking and vitamin D- takes 5 days a week  #anemia years ago-  prior on iron but stopped due to stomach upset/pain- update CBC with labs. Up to date on colonoscopy 12/08/19 with 5 year repeat planned with Dr. Collene Mares.   #HM- plans on flu shot next Thursday if feling better  Recommended follow up: Return in about 6 months (around 09/21/2022) for followup or sooner if needed.Schedule b4 you leave. Future Appointments  Date Time Provider  [place]  03/26/2022  2:30 PM [facility] NURSE [facility]  03/13/2023 10:00 AM [facility] HEALTH COACH [facility]    Lab/Order associations:   ICD-10-CM   1. Sore throat  J02.9 POCT rapid strep A    2. Cramping of feet  R25.2 Magnesium    3. Prediabetes  R73.03 HgB A1c    4. Hypothyroidism, unspecified type  E03.9 TSH    5. Hyperlipidemia, unspecified hyperlipidemia type  E78.5 CBC with Differential/Platelet    Comprehensive metabolic panel    Lipid panel    6. Essential hypertension  I10     7. Aortic atherosclerosis (HCC)  I70.0     8. History of iron deficiency  Z86.39 IBC + Ferritin      Meds ordered this encounter  Medications   amoxicillin (AMOXIL) 500 MG capsule    Sig: Take 1 capsule (500 mg total) by mouth 2 (two) times daily for 10 days.    Dispense:  20 capsule    Refill:  0   benzonatate (TESSALON) 100 MG capsule    Sig: Take 1 capsule (100 mg total) by mouth 2 (two) times daily as needed for up to 20 days for cough.    Dispense:  30 capsule    Refill:  0   Time Spent: 41 minutes of total time (10:42 PM-11:23 PM) was spent on the date of the encounter performing the following actions: chart review prior to seeing the patient, obtaining history, performing a medically necessary exam, counseling on the treatment plan in regards to acute condition with strep throat as well as discussion about potentially colonizer of strep and how that could affect a family member's results in similar situation, placing orders, and documenting in our EHR.   Return precautions advised.  Garret Reddish, MD

## 2022-03-26 ENCOUNTER — Ambulatory Visit: Payer: Medicare Other

## 2022-04-22 DIAGNOSIS — J988 Other specified respiratory disorders: Secondary | ICD-10-CM | POA: Diagnosis not present

## 2022-04-22 DIAGNOSIS — Z20822 Contact with and (suspected) exposure to covid-19: Secondary | ICD-10-CM | POA: Diagnosis not present

## 2022-04-22 DIAGNOSIS — B9689 Other specified bacterial agents as the cause of diseases classified elsewhere: Secondary | ICD-10-CM | POA: Diagnosis not present

## 2022-04-22 DIAGNOSIS — J9801 Acute bronchospasm: Secondary | ICD-10-CM | POA: Diagnosis not present

## 2022-05-16 ENCOUNTER — Encounter: Payer: Self-pay | Admitting: *Deleted

## 2022-05-26 ENCOUNTER — Other Ambulatory Visit: Payer: Self-pay | Admitting: Family Medicine

## 2022-05-28 ENCOUNTER — Telehealth: Payer: Self-pay | Admitting: Emergency Medicine

## 2022-05-28 NOTE — Telephone Encounter (Signed)
Pt has not been seen at the office since 03/2020. Pt will need to have an appt prior to any meds being prescribed to help with her symptoms.  Called and spoke with pt letting her know this and let her know that we did not have any openings this week and that she would need to go to either UC or call her PCP if she felt like she needed something now prior to appt on 1/4 that had been scheduled and she verbalized understanding. Nothing further needed.

## 2022-05-29 ENCOUNTER — Ambulatory Visit: Payer: Medicare Other

## 2022-06-06 ENCOUNTER — Ambulatory Visit: Payer: Medicare Other | Admitting: Nurse Practitioner

## 2022-06-25 ENCOUNTER — Other Ambulatory Visit (HOSPITAL_COMMUNITY): Payer: Self-pay

## 2022-06-26 DIAGNOSIS — J4520 Mild intermittent asthma, uncomplicated: Secondary | ICD-10-CM | POA: Diagnosis not present

## 2022-06-26 DIAGNOSIS — K219 Gastro-esophageal reflux disease without esophagitis: Secondary | ICD-10-CM | POA: Diagnosis not present

## 2022-06-26 DIAGNOSIS — H1045 Other chronic allergic conjunctivitis: Secondary | ICD-10-CM | POA: Diagnosis not present

## 2022-06-26 DIAGNOSIS — J310 Chronic rhinitis: Secondary | ICD-10-CM | POA: Diagnosis not present

## 2022-08-22 ENCOUNTER — Other Ambulatory Visit: Payer: Self-pay | Admitting: Family Medicine

## 2022-08-29 ENCOUNTER — Ambulatory Visit (INDEPENDENT_AMBULATORY_CARE_PROVIDER_SITE_OTHER): Payer: Medicare Other | Admitting: Physician Assistant

## 2022-08-29 ENCOUNTER — Encounter: Payer: Self-pay | Admitting: Physician Assistant

## 2022-08-29 VITALS — BP 122/80 | HR 79 | Temp 98.0°F | Ht 61.0 in | Wt 203.2 lb

## 2022-08-29 DIAGNOSIS — U071 COVID-19: Secondary | ICD-10-CM

## 2022-08-29 DIAGNOSIS — J4520 Mild intermittent asthma, uncomplicated: Secondary | ICD-10-CM

## 2022-08-29 DIAGNOSIS — J020 Streptococcal pharyngitis: Secondary | ICD-10-CM | POA: Diagnosis not present

## 2022-08-29 MED ORDER — NIRMATRELVIR/RITONAVIR (PAXLOVID)TABLET: 3.0000 | ORAL_TABLET | Freq: Two times a day (BID) | ORAL | 0 refills | Status: AC

## 2022-08-29 MED ORDER — AMOXICILLIN 500 MG PO CAPS: 500.0000 mg | ORAL_CAPSULE | Freq: Two times a day (BID) | ORAL | 0 refills | Status: AC

## 2022-08-29 MED ORDER — PREDNISONE 20 MG PO TABS: 40.0000 mg | ORAL_TABLET | Freq: Every day | ORAL | 0 refills | Status: DC

## 2022-08-29 NOTE — Patient Instructions (Signed)
It was great to see you!  You have strep throat.  Use medication as prescribed: amoxicillin  Push fluids and get plenty of rest. Please return if you are not improving as expected, or if you have high fevers (>101.5) or difficulty swallowing or worsening productive cough.  Call clinic with questions.  I hope you start feeling better soon!

## 2022-08-29 NOTE — Progress Notes (Signed)
Olivia Bender is a 72 y.o. female here for a new problem.  History of Present Illness:   Chief Complaint  Patient presents with   Cough    Pt c/o cough and sore throat since Sunday.     Cough and Sore throat:  She complains of a cough and sore throat beginning Sunday.  She woke up Sunday morning with a headache and body aches.  Notes that she was unable to move her legs to get out of bed.  She also endorses associated low appetite, which has since improved.  She has been monitoring her temperature and reports a high of 99.  She states she was experiencing cold sweats yesterday. Previously has had strep twice.  Has had COVID in the past and COVID vaccines  Hx of asthma --   Past Medical History:  Diagnosis Date   Allergy    Anxiety    Arthritis    Constipation    Enlarged liver    Generalized headaches    since teenage years- usually can get away with tylenol   Hyperlipidemia    Osteopenia    Thyroid disease    hypothyroidism     Social History   Tobacco Use   Smoking status: Never   Smokeless tobacco: Never  Substance Use Topics   Alcohol use: No   Drug use: Never    Past Surgical History:  Procedure Laterality Date   arthroscopic left knee surgery     DILATION AND CURETTAGE OF UTERUS     monthlong period related. everything was ok   NASAL SINUS SURGERY  1986    Family History  Problem Relation Age of Onset   Stroke Mother        79 . never smoker   Hypertension Mother    Other Father        killed in car wreck 2012   Prostate cancer Father    Obesity Sister    Kidney cancer Son    Heart disease Maternal Grandmother    Heart disease Maternal Grandfather     Allergies  Allergen Reactions   Atorvastatin     Other reaction(s): myalgias (muscle pain)   Crestor [Rosuvastatin]     Other reaction(s): muscle cramps   Molds & Smuts     Other reaction(s): respiratory distress   Pitavastatin     Other reaction(s): muscle cramps   Tetanus Toxoids  Anaphylaxis   Latex Rash   Ezetimibe Other (See Comments)    Myalgia   Statins Other (See Comments)    Lipitor, Crestor, Livalo -myalgias    Current Medications:   Current Outpatient Medications:    albuterol (VENTOLIN HFA) 108 (90 Base) MCG/ACT inhaler, , Disp: , Rfl:    amLODipine (NORVASC) 2.5 MG tablet, TAKE 1 TABLET BY MOUTH EVERY DAY, Disp: 90 tablet, Rfl: 3   amoxicillin (AMOXIL) 500 MG capsule, Take 1 capsule (500 mg total) by mouth 2 (two) times daily for 10 days., Disp: 20 capsule, Rfl: 0   aspirin 81 MG EC tablet, Take 81 mg by mouth daily., Disp: , Rfl:    Azelastine HCl 137 MCG/SPRAY SOLN, Place into the nose., Disp: , Rfl:    benzonatate (TESSALON) 100 MG capsule, TAKE 1 CAPSULE (100 MG TOTAL) BY MOUTH 2 (TWO) TIMES DAILY AS NEEDED FOR UP TO 20 DAYS FOR COUGH., Disp: 30 capsule, Rfl: 0   budesonide-formoterol (SYMBICORT) 80-4.5 MCG/ACT inhaler, Inhale 2 puffs into the lungs 4 (four) times daily as needed., Disp: , Rfl:  cyclobenzaprine (FLEXERIL) 10 MG tablet, Take 10 mg by mouth 3 (three) times daily as needed., Disp: , Rfl:    Ferrous Sulfate (IRON PO), Take by mouth. 3 times a week, Disp: , Rfl:    levothyroxine (SYNTHROID) 75 MCG tablet, TAKE 1 TABLET BY MOUTH EVERY DAY, Disp: 90 tablet, Rfl: 3   loratadine (CLARITIN) 10 MG tablet, Take 10 mg by mouth daily., Disp: , Rfl:    omeprazole (PRILOSEC OTC) 20 MG tablet, Take 20 mg by mouth as needed., Disp: , Rfl:    sertraline (ZOLOFT) 50 MG tablet, TAKE 1 TABLET BY MOUTH EVERY DAY, Disp: 90 tablet, Rfl: 3   triamcinolone (NASACORT) 55 MCG/ACT AERO nasal inhaler, Place 2 sprays into the nose daily., Disp: , Rfl:    VITAMIN D PO, Take by mouth., Disp: , Rfl:    Review of Systems:   Review of Systems  Constitutional:  Positive for diaphoresis.  HENT:  Positive for sore throat.   Respiratory:  Positive for cough.     Vitals:   Vitals:   08/29/22 0924  BP: 122/80  Pulse: 79  Temp: 98 F (36.7 C)  TempSrc:  Temporal  SpO2: 97%  Weight: 203 lb 4 oz (92.2 kg)  Height: 5\' 1"  (1.549 m)     Body mass index is 38.4 kg/m.  Physical Exam:   Physical Exam Vitals and nursing note reviewed.  Constitutional:      General: She is not in acute distress.    Appearance: She is well-developed. She is not ill-appearing or toxic-appearing.  HENT:     Head: Normocephalic and atraumatic.     Right Ear: Tympanic membrane, ear canal and external ear normal. Tympanic membrane is not erythematous, retracted or bulging.     Left Ear: Tympanic membrane, ear canal and external ear normal. Tympanic membrane is not erythematous, retracted or bulging.     Nose: Nose normal.     Right Sinus: No maxillary sinus tenderness or frontal sinus tenderness.     Left Sinus: No maxillary sinus tenderness or frontal sinus tenderness.     Mouth/Throat:     Pharynx: Uvula midline. Posterior oropharyngeal erythema present.  Eyes:     General: Lids are normal.     Conjunctiva/sclera: Conjunctivae normal.  Neck:     Trachea: Trachea normal.  Cardiovascular:     Rate and Rhythm: Normal rate and regular rhythm.     Heart sounds: Normal heart sounds, S1 normal and S2 normal.  Pulmonary:     Effort: Pulmonary effort is normal.     Breath sounds: Normal breath sounds. No decreased breath sounds, wheezing, rhonchi or rales.  Lymphadenopathy:     Cervical: No cervical adenopathy.  Skin:    General: Skin is warm and dry.  Neurological:     Mental Status: She is alert.  Psychiatric:        Speech: Speech normal.        Behavior: Behavior normal. Behavior is cooperative.     Assessment and Plan:   COVID-19 Assumed positive -- she is here with husband with similar sx Will send in paxlovid and prednisone for her given asthma hx Push fluids and rest Keep an eye on sx and monitor  Follow-up if new/worsening sx Low threshold to go to the ER  Strep pharyngitis Assumed positive -- she is here with husband with similar  sx Start amoxicillin per orders Follow-up if new/worsening sx  Asthma Overall controlled Taking Symbicort twice daily Was using albuterol  a bit at beginning of illness but not anymore  I,Rachel Rivera,acting as a scribe for [employer], PA.,have documented all relevant documentation on the behalf of Inda Coke, PA,as directed by  Inda Coke, PA while in the presence of Inda Coke, [place].  I, Inda Coke, [place], have reviewed all documentation for this visit. The documentation on 08/29/22 for the exam, diagnosis, procedures, and orders are all accurate and complete.  Inda Coke, PA-C

## 2022-09-02 ENCOUNTER — Other Ambulatory Visit: Payer: Self-pay | Admitting: Family Medicine

## 2022-09-20 ENCOUNTER — Ambulatory Visit (INDEPENDENT_AMBULATORY_CARE_PROVIDER_SITE_OTHER): Payer: Medicare Other | Admitting: Family Medicine

## 2022-09-20 ENCOUNTER — Encounter: Payer: Self-pay | Admitting: Family Medicine

## 2022-09-20 VITALS — BP 136/83 | HR 67 | Temp 97.2°F | Ht 61.0 in | Wt 199.8 lb

## 2022-09-20 DIAGNOSIS — I10 Essential (primary) hypertension: Secondary | ICD-10-CM

## 2022-09-20 DIAGNOSIS — E785 Hyperlipidemia, unspecified: Secondary | ICD-10-CM

## 2022-09-20 DIAGNOSIS — I700 Atherosclerosis of aorta: Secondary | ICD-10-CM

## 2022-09-20 DIAGNOSIS — R739 Hyperglycemia, unspecified: Secondary | ICD-10-CM | POA: Diagnosis not present

## 2022-09-20 DIAGNOSIS — E039 Hypothyroidism, unspecified: Secondary | ICD-10-CM | POA: Diagnosis not present

## 2022-09-20 DIAGNOSIS — F325 Major depressive disorder, single episode, in full remission: Secondary | ICD-10-CM | POA: Diagnosis not present

## 2022-09-20 DIAGNOSIS — Z131 Encounter for screening for diabetes mellitus: Secondary | ICD-10-CM

## 2022-09-20 LAB — TSH: TSH: 1.84 u[IU]/mL (ref 0.35–5.50)

## 2022-09-20 LAB — COMPREHENSIVE METABOLIC PANEL
ALT: 27 U/L (ref 0–35)
AST: 32 U/L (ref 0–37)
Albumin: 4.1 g/dL (ref 3.5–5.2)
Alkaline Phosphatase: 57 U/L (ref 39–117)
BUN: 13 mg/dL (ref 6–23)
CO2: 26 mEq/L (ref 19–32)
Calcium: 9 mg/dL (ref 8.4–10.5)
Chloride: 104 mEq/L (ref 96–112)
Creatinine, Ser: 0.78 mg/dL (ref 0.40–1.20)
GFR: 75.91 mL/min (ref >60.00)
Glucose, Bld: 92 mg/dL (ref 70–99)
Potassium: 4.1 mEq/L (ref 3.5–5.1)
Sodium: 139 mEq/L (ref 135–145)
Total Bilirubin: 0.5 mg/dL (ref 0.2–1.2)
Total Protein: 7.2 g/dL (ref 6.0–8.3)

## 2022-09-20 LAB — HEMOGLOBIN A1C: Hgb A1c MFr Bld: 6 % (ref 4.6–6.5)

## 2022-09-20 MED ORDER — DOXYCYCLINE HYCLATE 100 MG PO TABS: 100.0000 mg | ORAL_TABLET | Freq: Two times a day (BID) | ORAL | 0 refills | Status: AC

## 2022-09-20 NOTE — Progress Notes (Signed)
Phone [phone] In person visit   Subjective:   Olivia Bender is a 72 y.o. year old very pleasant female patient who presents for/with See problem oriented charting Chief Complaint  Patient presents with   6 month follow-up   Past Medical History-  Patient Active Problem List   Diagnosis Date Noted   Chronic allergic conjunctivitis 11/05/2021    Priority: Medium    Mild intermittent asthma 11/05/2021    Priority: Medium    Essential hypertension 12/10/2019    Priority: Medium    Aortic atherosclerosis 12/11/2018    Priority: Medium    Major depression in full remission 05/18/2018    Priority: Medium    Asthma 11/21/2017    Priority: Medium    Generalized headaches     Priority: Medium    Fatty infiltration of liver 12/30/2014    Priority: Medium    Prediabetes 06/30/2014    Priority: Medium    Hypothyroidism 12/01/2010    Priority: Medium    Hyperlipidemia 12/01/2010    Priority: Medium    Osteopenia 12/01/2010    Priority: Medium    GE reflux 12/01/2010    Priority: Medium    Trochanteric bursitis of left hip 12/11/2015    Priority: Low   Statin intolerance 06/30/2014    Priority: Low   Left knee pain 06/30/2014    Priority: Low   Allergic rhinitis 12/01/2010    Priority: Low   Pulmonary nodule/lesion, solitary 02/21/2020    Medications- reviewed and updated Current Outpatient Medications  Medication Sig Dispense Refill   amLODipine (NORVASC) 2.5 MG tablet TAKE 1 TABLET BY MOUTH EVERY DAY 90 tablet 3   aspirin 81 MG EC tablet Take 81 mg by mouth daily.     Azelastine HCl 137 MCG/SPRAY SOLN Place into the nose.     budesonide-formoterol (SYMBICORT) 80-4.5 MCG/ACT inhaler Inhale 2 puffs into the lungs 2 (two) times daily.     cyclobenzaprine (FLEXERIL) 10 MG tablet Take 10 mg by mouth 3 (three) times daily as needed.     Ferrous Sulfate (IRON PO) Take by mouth. 3 times a week     levothyroxine (SYNTHROID) 75 MCG tablet TAKE 1 TABLET BY MOUTH EVERY DAY  90 tablet 3   loratadine (CLARITIN) 10 MG tablet Take 10 mg by mouth daily.     omeprazole (PRILOSEC OTC) 20 MG tablet Take 20 mg by mouth as needed.     predniSONE (DELTASONE) 20 MG tablet Take 2 tablets (40 mg total) by mouth daily. 10 tablet 0   sertraline (ZOLOFT) 50 MG tablet TAKE 1 TABLET BY MOUTH EVERY DAY 90 tablet 3   triamcinolone (NASACORT) 55 MCG/ACT AERO nasal inhaler Place 2 sprays into the nose daily.     VITAMIN D PO Take by mouth.     doxycycline (VIBRA-TABS) 100 MG tablet Take 1 tablet (100 mg total) by mouth 2 (two) times daily for 7 days. 14 tablet 0   No current facility-administered medications for this visit.     Objective:  BP 136/83 (BP Location: Right Arm, Patient Position: Sitting)   Pulse 67   Temp (!) 97.2 F (36.2 C) (Temporal)   Ht  (1.549 m)   Wt 199 lb 12.8 oz (90.6 kg)   SpO2 100%   BMI 37.75 kg/m  Gen: NAD, resting comfortably Right maxillary sinus tenderness, orophaynx largely normal other than some drainage, nasal turbinates erythematous with yellow discharge, tympanic membrane normal bilaterally  CV: RRR no murmurs rubs or gallops  Lungs: CTAB no crackles, wheeze, rhonchi.  Ext: no edema Skin: warm, dry    Assessment and Plan   # Covid 19 with ongoing sinus pressure after over 3 weeks S: had COVID in late march along with strep - was given amoxicillin for the strep. Reports that symptoms from COVID improved but was left with lingering sinus pressure/congestion/feeling of upper chest congestion that is not improving.  Can get coughing spells at night. Not wheezing thankfully.  A/P:  lingering sinus pressure/congestion- with well over 3 weeks of symptoms we discussed trial of doxycycline (with recent amoxicillin usage) - thinks she will be indoors with allergies - warned of photosensitive rash potential- she agrees to follow up if fails to improve. Could be long COVID as well  #hypertension S: medication: amlodipine 2.5 mg Home readings  #s: no recent checks- up to 130s at home BP Readings from Last 3 Encounters:  09/20/22 136/83  08/29/22 122/80  03/22/22 132/78  A/P: stable- continue current medicines   #hyperlipidemia #aortic atherosclerosis S: Medication: praluent in the past but is off of this . Doesn't tolerate zetia or statins The 10-year ASCVD risk score (Arnett DK, et al., 2019) is: 18.4% Lab Results  Component Value Date   CHOL 243 (H) 03/22/2022   HDL 46.10 03/22/2022   LDLCALC 163 (H) 03/22/2022   LDLDIRECT 184.0 11/21/2017   TRIG 172.0 (H) 03/22/2022   CHOLHDL 5 03/22/2022   A/P: off of praluent- strongly encouraged follow up with Dr. Rennis Golden and hopefully restart this with prior elevations  #hypothyroidism S: compliant On thyroid medication- levothyroxine 75 mcg      Lab Results  Component Value Date   TSH 2.44 03/22/2022  A/P: hopefully stable- update tsh today. Continue current meds for now    # Asthma- Dr. Irena Cords S: Maintenance Medication: Symbicort 80-4.5 2 puffs twice daily- with SMART therapy -no wheeze with lingering cough A/P: asthma seems controlled- continue current medications    # Depression  S: Medication: sertraline 50 mg    09/20/2022    9:40 AM 03/22/2022   10:30 AM 02/28/2022    9:00 AM  Depression screen PHQ 2/9  Decreased Interest 0 0 0  Down, Depressed, Hopeless 0 1 0  PHQ - 2 Score 0 1 0  Altered sleeping  0   Tired, decreased energy  1   Change in appetite  1   Feeling bad or failure about yourself   0   Trouble concentrating  0   Moving slowly or fidgety/restless  0   Suicidal thoughts  0   PHQ-9 Score  3   Difficult doing work/chores  Not difficult at all   A/P: reasonable control/full remisison- continue current medications   # Hyperglycemia/insulin resistance/prediabetes- a1c up to 5.8 S:  Medication: none Exercise and diet- doing some rehab exercises her husband was given      Lab Results  Component Value Date   HGBA1C 5.8 03/22/2022   HGBA1C 5.8  03/19/2021   HGBA1C 5.4 12/10/2019  A/P:  hopefully stable- update a1c today. Continue without meds for now   #allergies- astelin and claritin 10 mg  Recommended follow up: Return in about 7 months (around 04/22/2023) for followup or sooner if needed.Schedule b4 you leave. Future Appointments  Date Time Provider Department Center  03/13/2023 10:00 AM [facility] ANNUAL WELLNESS VISIT 1 [facility]   Lab/Order associations:   ICD-10-CM   1. Essential hypertension  I10 Comp Met (CMET)    2. Hyperlipidemia, unspecified hyperlipidemia  type  E78.5 Comp Met (CMET)    3. Hypothyroidism, unspecified type  E03.9 TSH    4. Hyperglycemia  R73.9 HgB A1c    5. Screening for diabetes mellitus  Z13.1 HgB A1c    6. Major depressive disorder with single episode, in full remission Chronic F32.5     7. Aortic atherosclerosis Chronic I70.0      Meds ordered this encounter  Medications   doxycycline (VIBRA-TABS) 100 MG tablet    Sig: Take 1 tablet (100 mg total) by mouth 2 (two) times daily for 7 days.    Dispense:  14 tablet    Refill:  0   Return precautions advised.  Tana Conch, MD

## 2022-09-20 NOTE — Patient Instructions (Addendum)
off of praluent- strongly encouraged follow up with Dr. Rennis Golden and hopefully restart this with prior elevations  lingering sinus pressure/congestion- with well over 3 weeks of symptoms we discussed trial of doxycycline (with recent amoxicillin usage) - thinks she will be indoors with allergies - warned of photosensitive rash potential- she agrees to follow up if fails to improve. Could be long COVID as well  Please stop by lab before you go If you have mychart- we will send your results within 3 business days of [place] receiving them.  If you do not have mychart- we will call you about results within 5 business days of [place] receiving them.  *please also note that you will see labs on mychart as soon as they post. I will later go in and write notes on them- will say "notes from Dr. Durene Cal"   Recommended follow up: Return in about 7 months (around 04/22/2023) for followup or sooner if needed.Schedule b4 you leave.

## 2022-09-25 ENCOUNTER — Telehealth: Payer: Self-pay | Admitting: Pharmacist

## 2022-09-25 NOTE — Telephone Encounter (Signed)
-----   Message from Alver Sorrow, NP sent at 09/25/2022  2:28 PM EDT ----- Regarding: RE: Trish's mom Got it, thanks!  CCing pharmacy team - if she was interested is her medicare one of the ones where Wilber Bihari is favorable? Helps to know before I offer. Looks like on Praluent at some point so [employer] with her about whether resuming that is possible. Not sure why she stopped.  Caitlin ----- Message ----- From: Jodelle Red, MD Sent: 09/25/2022   2:01 PM EDT To: Alver Sorrow, NP Subject: Trish's mom                                    [place], wanted to let you know that Trish asked me about her mom--I'm happy to manage her cholesterol, I guess Dr. Durene Cal has been hard to talk to about this. She's going to see you next Friday at 2:20, but I think I am around that day if you need me :)  Bridgette

## 2022-09-25 NOTE — Telephone Encounter (Signed)
She actually has a Medicare Part D plan as well as a supplement so she's a rare candidate who would qualify for Repatha/Praluent as well as Leqvio

## 2022-10-04 ENCOUNTER — Encounter (HOSPITAL_BASED_OUTPATIENT_CLINIC_OR_DEPARTMENT_OTHER): Payer: Self-pay | Admitting: Family

## 2022-10-04 ENCOUNTER — Ambulatory Visit (INDEPENDENT_AMBULATORY_CARE_PROVIDER_SITE_OTHER): Payer: Medicare Other | Admitting: Family

## 2022-10-04 VITALS — BP 128/72 | HR 90 | Ht 61.0 in | Wt 204.0 lb

## 2022-10-04 DIAGNOSIS — I10 Essential (primary) hypertension: Secondary | ICD-10-CM

## 2022-10-04 DIAGNOSIS — R002 Palpitations: Secondary | ICD-10-CM

## 2022-10-04 DIAGNOSIS — E785 Hyperlipidemia, unspecified: Secondary | ICD-10-CM

## 2022-10-04 DIAGNOSIS — I2510 Atherosclerotic heart disease of native coronary artery without angina pectoris: Secondary | ICD-10-CM

## 2022-10-04 DIAGNOSIS — I471 Supraventricular tachycardia, unspecified: Secondary | ICD-10-CM

## 2022-10-04 DIAGNOSIS — R931 Abnormal findings on diagnostic imaging of heart and coronary circulation: Secondary | ICD-10-CM

## 2022-10-04 MED ORDER — METOPROLOL SUCCINATE ER 25 MG PO TB24: ORAL_TABLET | ORAL | 3 refills | Status: DC

## 2022-10-04 NOTE — Progress Notes (Unsigned)
Office Visit    Patient Name: Olivia Bender Date of Encounter: 10/04/2022  PCP:  Shelva Majestic, MD   [facility]  Cardiologist:  Jodelle Red, MD  Advanced Practice Provider:  No care team member to display Electrophysiologist:  None      Chief Complaint    Olivia Bender is a 72 y.o. female presents today for discussion regarding cholesterol medication   Past Medical History    Past Medical History:  Diagnosis Date   Allergy    Anxiety    Arthritis    Constipation    Enlarged liver    Generalized headaches    since teenage years- usually can get away with tylenol   Hyperlipidemia    Osteopenia    Thyroid disease    hypothyroidism   Past Surgical History:  Procedure Laterality Date   arthroscopic left knee surgery     DILATION AND CURETTAGE OF UTERUS     monthlong period related. everything was ok   NASAL SINUS SURGERY  1986    Allergies  Allergies  Allergen Reactions   Atorvastatin     Other reaction(s): myalgias (muscle pain)   Crestor [Rosuvastatin]     Other reaction(s): muscle cramps   Molds & Smuts     Other reaction(s): respiratory distress   Pitavastatin     Other reaction(s): muscle cramps   Tetanus Toxoids Anaphylaxis   Latex Rash   Ezetimibe Other (See Comments)    Myalgia   Statins Other (See Comments)    Lipitor, Crestor, Livalo -myalgias    History of Present Illness    [name] is a 72 y.o. female with a hx of HLD, hypothyroidism, arthritis, GERD, depression, anxiety, coronary artery calcification last seen 04/25/21 by Dr. Cristal Deer.  Coronary calcium score performed 07/29/18 with coronary calcium score of 7 placing her in the 21st percnetile for age/sex. Seen 04/25/21 with palpitations after COVID. Monitor revealed short episodes of SVT. As she was overall not bothered by palpitations, medical therapy was deferred.   Presents today with her husband. Notes episodes of heart racing with  activity such as walking from bathroom to bedroom. Drinks half caffeinated coffee 4-5 cups per day then water. Rarely will have a Coke Zero. No exertional dyspnea, chest pain. Had brief episode of left arm pain earlier this week while sitting holding her phone up. Reassurance provided atypical for angina. Has not taken Praluent in some time. Noted it was expensive despite grant funds.   EKGs/Labs/Other Studies Reviewed:   The following studies were reviewed today: Cardiac Studies & Procedures         MONITORS  LONG TERM MONITOR-LIVE TELEMETRY (3-14 DAYS) 04/25/2021  Narrative Patch Wear Time:  13 days and 23 hours  Patient had a min HR of 61 bpm, max HR of 167 bpm, and avg HR of 82 bpm. Predominant underlying rhythm was Sinus Rhythm. 8 Supraventricular Tachycardia runs occurred, the run with the fastest interval lasting 11 beats with a max rate of 167 bpm (avg 148 bpm); the run with the fastest interval was also the longest. No VT, atrial fibrillation, high degree block, or pauses noted. Isolated atrial and ventricular ectopy was rare (<1%). There were 4 triggered events. Two were normal sinus rhythm, two were normal sinus rhythm with a PAC. No high risk arrhythmias noted.   CT SCANS  CT CARDIAC SCORING (SELF PAY ONLY) 07/29/2018  Addendum 07/29/2018  4:19 PM ADDENDUM REPORT: 07/29/2018 16:17  CLINICAL  DATA:  Risk stratification  EXAM: Coronary Calcium Score  TECHNIQUE: The patient was scanned on a [employer] scanner. Axial non-contrast 3 mm slices were carried out through the heart. The data set was analyzed on a dedicated work station and scored using the Agatson method.  FINDINGS: Non-cardiac: See separate report from [facility].  Ascending Aorta: Normal size. Trivial calcifications in the aortic root.  Pericardium: Normal.  Coronary arteries: Normal origin.  IMPRESSION: Coronary calcium score of 7. This was 21 percentile for age and sex matched  control.   Electronically Signed By: Tobias Alexander On: 07/29/2018 16:17  Narrative EXAM: OVER-READ INTERPRETATION  CT CHEST  The following report is an over-read performed by radiologist Dr. Charlett Nose of [facility] on 07/29/2018. This over-read does not include interpretation of cardiac or coronary anatomy or pathology. The coronary calcium score interpretation by the cardiologist is attached.  COMPARISON:  None.  FINDINGS: Vascular: Heart is normal size.  Visualized aorta normal caliber.  Mediastinum/Nodes: No adenopathy in the lower mediastinum or hila.  Lungs/Pleura: Right lower lobe nodule measures 10 mm on image 14. Lungs otherwise clear. No effusions.  Upper Abdomen: Imaging into the upper abdomen shows no acute findings.  Musculoskeletal: Chest wall soft tissues are unremarkable. No acute bony abnormality.  IMPRESSION: 10 mm right lower lobe pulmonary nodule. Consider one of the following in 3 months for both low-risk and high-risk individuals: (a) repeat chest CT, (b) follow-up PET-CT, or (c) tissue sampling. This recommendation follows the consensus statement: Guidelines for Management of Incidental Pulmonary Nodules Detected on CT Images: From the Fleischner Society 2017; Radiology 2017; 284:228-243.  Electronically Signed: By: Charlett Nose M.D. On: 07/29/2018 15:03           EKG:  EKG is  ordered today.  The ekg ordered today demonstrates NSR 90 bpm with LAD. No acute ST/T wave changes.   Recent Labs: 03/22/2022: Hemoglobin 11.9; Magnesium 2.2; Platelets 245.0 09/20/2022: ALT 27; BUN 13; Creatinine, Ser 0.78; Potassium 4.1; Sodium 139; TSH 1.84  Recent Lipid Panel    Component Value Date/Time   CHOL 243 (H) 03/22/2022 1129   CHOL 137 12/15/2020 0914   TRIG 172.0 (H) 03/22/2022 1129   HDL 46.10 03/22/2022 1129   HDL 43 12/15/2020 0914   CHOLHDL 5 03/22/2022 1129   VLDL 34.4 03/22/2022 1129   LDLCALC 163 (H) 03/22/2022 1129    LDLCALC 64 12/15/2020 0914   LDLCALC 96 12/10/2019 1628   LDLDIRECT 184.0 11/21/2017 1106    Home Medications   Current Meds  Medication Sig   amLODipine (NORVASC) 2.5 MG tablet TAKE 1 TABLET BY MOUTH EVERY DAY   aspirin 81 MG EC tablet Take 81 mg by mouth daily.   Azelastine HCl 137 MCG/SPRAY SOLN Place into the nose.   budesonide-formoterol (SYMBICORT) 80-4.5 MCG/ACT inhaler Inhale 2 puffs into the lungs 2 (two) times daily.   cyclobenzaprine (FLEXERIL) 10 MG tablet Take 10 mg by mouth 3 (three) times daily as needed.   Ferrous Sulfate (IRON PO) Take by mouth. 3 times a week   levothyroxine (SYNTHROID) 75 MCG tablet TAKE 1 TABLET BY MOUTH EVERY DAY   loratadine (CLARITIN) 10 MG tablet Take 10 mg by mouth daily.   omeprazole (PRILOSEC OTC) 20 MG tablet Take 20 mg by mouth as needed.   sertraline (ZOLOFT) 50 MG tablet TAKE 1 TABLET BY MOUTH EVERY DAY   triamcinolone (NASACORT) 55 MCG/ACT AERO nasal inhaler Place 2 sprays into the nose daily.  VITAMIN D PO Take by mouth.     Review of Systems      All other systems reviewed and are otherwise negative except as noted above.  Physical Exam    VS:  BP 128/72   Pulse 90   Ht 5\' 1"  (1.549 m)   Wt 204 lb (92.5 kg)   BMI 38.55 kg/m  , BMI Body mass index is 38.55 kg/m.  Wt Readings from Last 3 Encounters:  10/04/22 204 lb (92.5 kg)  09/20/22 199 lb 12.8 oz (90.6 kg)  08/29/22 203 lb 4 oz (92.2 kg)    GEN: Well nourished, well developed, in no acute distress. HEENT: normal. Neck: Supple, no JVD, carotid bruits, or masses. Cardiac: RRR, no murmurs, rubs, or gallops. No clubbing, cyanosis, edema.  Radials/PT 2+ and equal bilaterally.  Respiratory:  Respirations regular and unlabored, clear to auscultation bilaterally. GI: Soft, nontender, nondistended. MS: No deformity or atrophy. Skin: Warm and dry, no rash. Neuro:  Strength and sensation are intact. Psych: Normal affect.  Assessment & Plan    Palpitations / SVT -  Monitor 04/2021 brief episodes of SVT. Notes palpitations with little activity like walking around the house. Will Rx Toprol 25mg  QD. Encouraged to avoid caffeine, stay hydrated, increase physical activity.   Obesity - Weight loss via diet and exercise encouraged. Discussed the impact being overweight would have on cardiovascular risk. Discussed [facility]P program at [facility], she will le tus know if she wants referral.   Coronary artery calcification / HLD, LDL goal <70 - Stable with no anginal symptoms. No indication for ischemic evaluation.  GDMT Aspirin. Previously on Praluent but notes was cost prohibitive. Discussed Incliseron which she is interested in. Will reach out to pharmacy team to coordinate.   HTN - BP well controlled. Continue current antihypertensive regimen Amlodipine 2.5mg  QD.  Hypothyroidism - Continue to follow with PCP.   Asthma - Continue to follow with PCP. No evidence of acute exacerbation. Recent use of Prednisone likely contributory to palpitations. Utilize cardioselective beta blocker, as above.          Disposition: Follow up in 6 month(s) with Jodelle Red, MD or APP.  Signed, Alver Sorrow, NP 10/04/2022, 2:35 PM [facility]

## 2022-10-04 NOTE — Patient Instructions (Signed)
Medication Instructions:  Your physician has recommended you make the following change in your medication:   Start: Metoprolol Succinate 25mg  daily- you take an extra half tablet as needed for breakthrough palpitations   Start: Wilber Bihari- The pharmacy team will reach out to get this coordinated.   *If you need a refill on your cardiac medications before your next appointment, please call your pharmacy*  Follow-Up: At [facility], you and your health needs are our priority.  As part of our continuing mission to provide you with exceptional heart care, we have created designated Provider Care Teams.  These Care Teams include your primary Cardiologist (physician) and Advanced Practice Providers (APPs -  Physician Assistants and Nurse Practitioners) who all work together to provide you with the care you need, when you need it.  We recommend signing up for the patient portal called "MyChart".  Sign up information is provided on this After Visit Summary.  MyChart is used to connect with patients for Virtual Visits (Telemedicine).  Patients are able to view lab/test results, encounter notes, upcoming appointments, etc.  Non-urgent messages can be sent to your provider as well.   To learn more about what you can do with MyChart, go to [email or web address].    Your next appointment:   6 month(s)  Provider:   Jodelle Red, MD or Gillian Shields, NP    Other Instructions To prevent palpitations: Make sure you are adequately hydrated.  Avoid and/or limit caffeine containing beverages like soda or tea. Exercise regularly.  Manage stress well. Some over the counter medications can cause palpitations such as Benadryl, AdvilPM, TylenolPM. Regular Advil or Tylenol do not cause palpitations.

## 2022-10-05 ENCOUNTER — Encounter (HOSPITAL_BASED_OUTPATIENT_CLINIC_OR_DEPARTMENT_OTHER): Payer: Self-pay | Admitting: Family

## 2022-10-07 ENCOUNTER — Other Ambulatory Visit: Payer: Self-pay | Admitting: Pharmacist

## 2022-10-09 ENCOUNTER — Telehealth: Payer: Self-pay | Admitting: Pharmacy Technician

## 2022-10-09 NOTE — Telephone Encounter (Signed)
Melissa, Fyi note:  Patient will be scheduled as soon as possible.  Auth Submission: NO AUTH NEEDED Site of care: Site of care: [facility] Payer: medicare a/b & supp Medication & CPT/J Code(s) submitted: Leqvio (Inclisiran) J1306 Route of submission (phone, fax, portal):  Phone # Fax # Auth type: Buy/Bill Units/visits requested: 2 Reference number:  Approval from: 10/09/22 to 06/03/23   Medicare will cover 80% and [employer] of [place] will cover remianing 20%

## 2022-10-10 ENCOUNTER — Other Ambulatory Visit: Payer: Self-pay

## 2022-10-10 NOTE — Addendum Note (Signed)
Addended by: Malena Peer D on: 10/10/2022 01:41 PM   Modules accepted: Orders

## 2022-10-16 ENCOUNTER — Ambulatory Visit (INDEPENDENT_AMBULATORY_CARE_PROVIDER_SITE_OTHER): Payer: Medicare Other

## 2022-10-16 ENCOUNTER — Encounter: Payer: Self-pay | Admitting: Family

## 2022-10-16 VITALS — BP 134/85 | HR 74 | Temp 97.4°F | Resp 18 | Ht 61.0 in | Wt 203.0 lb

## 2022-10-16 DIAGNOSIS — E785 Hyperlipidemia, unspecified: Secondary | ICD-10-CM

## 2022-10-16 DIAGNOSIS — I10 Essential (primary) hypertension: Secondary | ICD-10-CM

## 2022-10-16 DIAGNOSIS — I700 Atherosclerosis of aorta: Secondary | ICD-10-CM

## 2022-10-16 MED ORDER — INCLISIRAN SODIUM 284 MG/1.5ML ~~LOC~~ SOSY
284.0000 mg | PREFILLED_SYRINGE | Freq: Once | SUBCUTANEOUS | Status: AC
  Administered 2022-10-16: 284 mg via SUBCUTANEOUS
  Filled 2022-10-16: qty 1.5

## 2022-10-16 NOTE — Patient Instructions (Signed)
Inclisiran Injection What is this medication? INCLISIRAN (in kli SIR an) treats high cholesterol. It works by decreasing bad cholesterol (such as LDL) in your blood. Changes to diet and exercise are often combined with this medication. This medicine may be used for other purposes; ask your health care provider or pharmacist if you have questions. COMMON BRAND NAME(S): LEQVIO What should I tell my care team before I take this medication? They need to know if you have any of these conditions: An unusual or allergic reaction to inclisiran, other medications, foods, dyes, or preservatives Pregnant or trying to get pregnant Breast-feeding How should I use this medication? This medication is injected under the skin. It is given by your care team in a hospital or clinic setting. Talk to your care team about the use of this medication in children. Special care may be needed. Overdosage: If you think you have taken too much of this medicine contact a poison control center or emergency room at once. NOTE: This medicine is only for you. Do not share this medicine with others. What if I miss a dose? Keep appointments for follow-up doses. It is important not to miss your dose. Call your care team if you are unable to keep an appointment. What may interact with this medication? Interactions are not expected. This list may not describe all possible interactions. Give your health care provider a list of all the medicines, herbs, non-prescription drugs, or dietary supplements you use. Also tell them if you smoke, drink alcohol, or use illegal drugs. Some items may interact with your medicine. What should I watch for while using this medication? Visit your care team for regular checks on your progress. Tell your care team if your symptoms do not start to get better or if they get worse. You may need blood work while you are taking this medication. What side effects may I notice from receiving this  medication? Side effects that you should report to your care team as soon as possible: Allergic reactions--skin rash, itching, hives, swelling of the face, lips, tongue, or throat Side effects that usually do not require medical attention (report these to your care team if they continue or are bothersome): Joint pain Pain, redness, or irritation at injection site This list may not describe all possible side effects. Call your doctor for medical advice about side effects. You may report side effects to FDA at 1-800-FDA-1088. Where should I keep my medication? This medication is given in a hospital or clinic. It will not be stored at home. NOTE: This sheet is a summary. It may not cover all possible information. If you have questions about this medicine, talk to your doctor, pharmacist, or health care provider.  2023 Elsevier/Gold Standard (2020-06-07 00:00:00)  

## 2022-10-16 NOTE — Progress Notes (Signed)
Diagnosis: Hyperlipidemia  Provider:  Chilton Greathouse MD  Procedure: Injection  Leqvio (inclisiran), Dose: 284 mg, Site: subcutaneous, Number of injections: 1  Post Care: Observation period completed  Discharge: Condition: Good, Destination: Home . AVS Provided  Performed by:  Adriana Mccallum, RN

## 2022-10-18 ENCOUNTER — Encounter (HOSPITAL_BASED_OUTPATIENT_CLINIC_OR_DEPARTMENT_OTHER): Payer: Self-pay

## 2022-12-21 ENCOUNTER — Encounter: Payer: Self-pay | Admitting: Family

## 2022-12-23 DIAGNOSIS — Z1231 Encounter for screening mammogram for malignant neoplasm of breast: Secondary | ICD-10-CM | POA: Diagnosis not present

## 2022-12-23 LAB — HM MAMMOGRAPHY

## 2022-12-24 ENCOUNTER — Encounter: Payer: Self-pay | Admitting: Family Medicine

## 2022-12-31 ENCOUNTER — Other Ambulatory Visit (HOSPITAL_BASED_OUTPATIENT_CLINIC_OR_DEPARTMENT_OTHER): Payer: Self-pay | Admitting: Family

## 2023-01-13 ENCOUNTER — Encounter: Payer: Self-pay | Admitting: Family

## 2023-01-20 ENCOUNTER — Ambulatory Visit (INDEPENDENT_AMBULATORY_CARE_PROVIDER_SITE_OTHER): Payer: Medicare Other

## 2023-01-20 VITALS — BP 150/82 | HR 72 | Temp 97.7°F | Resp 16 | Ht 60.0 in | Wt 204.2 lb

## 2023-01-20 DIAGNOSIS — E785 Hyperlipidemia, unspecified: Secondary | ICD-10-CM | POA: Diagnosis not present

## 2023-01-20 DIAGNOSIS — I700 Atherosclerosis of aorta: Secondary | ICD-10-CM

## 2023-01-20 DIAGNOSIS — I10 Essential (primary) hypertension: Secondary | ICD-10-CM

## 2023-01-20 MED ORDER — INCLISIRAN SODIUM 284 MG/1.5ML ~~LOC~~ SOSY
284.0000 mg | PREFILLED_SYRINGE | Freq: Once | SUBCUTANEOUS | Status: AC
  Administered 2023-01-20: 284 mg via SUBCUTANEOUS
  Filled 2023-01-20: qty 1.5

## 2023-01-20 NOTE — Progress Notes (Signed)
Diagnosis: Hyperlipidemia  Provider:  Chilton Greathouse MD  Procedure: Injection  Leqvio (inclisiran), Dose: 284 mg, Site: subcutaneous, Number of injections: 1  Administered in right arm.  Post Care: Patient declined observation  Discharge: Condition: Good, Destination: Home . AVS Provided  Performed by:  Wyvonne Lenz, RN

## 2023-02-05 DIAGNOSIS — J4520 Mild intermittent asthma, uncomplicated: Secondary | ICD-10-CM | POA: Diagnosis not present

## 2023-02-05 DIAGNOSIS — K219 Gastro-esophageal reflux disease without esophagitis: Secondary | ICD-10-CM | POA: Diagnosis not present

## 2023-02-05 DIAGNOSIS — J310 Chronic rhinitis: Secondary | ICD-10-CM | POA: Diagnosis not present

## 2023-02-05 DIAGNOSIS — H1045 Other chronic allergic conjunctivitis: Secondary | ICD-10-CM | POA: Diagnosis not present

## 2023-02-11 ENCOUNTER — Ambulatory Visit (INDEPENDENT_AMBULATORY_CARE_PROVIDER_SITE_OTHER): Payer: Medicare Other

## 2023-02-11 DIAGNOSIS — Z23 Encounter for immunization: Secondary | ICD-10-CM | POA: Diagnosis not present

## 2023-02-11 NOTE — Progress Notes (Signed)
Pt came in on the Nurse schedule to receive Flu vaccine. Administered in the Rt deltoid area without any complaints

## 2023-03-13 ENCOUNTER — Ambulatory Visit: Payer: Medicare Other

## 2023-03-13 VITALS — Wt 199.0 lb

## 2023-03-13 DIAGNOSIS — Z0000 Encounter for general adult medical examination without abnormal findings: Secondary | ICD-10-CM | POA: Diagnosis not present

## 2023-03-13 NOTE — Progress Notes (Signed)
Subjective:   Olivia Bender is a 72 y.o. female who presents for Medicare Annual (Subsequent) preventive examination.  Visit Complete: Virtual I connected with  Olivia Bender on 03/13/23 by a audio enabled telemedicine application and verified that I am speaking with the correct person using two identifiers.  Patient Location: Home  Provider Location: Office/Clinic  I discussed the limitations of evaluation and management by telemedicine. The patient expressed understanding and agreed to proceed.  Vital Signs: Because this visit was a virtual/telehealth visit, some criteria may be missing or patient reported. Any vitals not documented were not able to be obtained and vitals that have been documented are patient reported.  Patient Medicare AWV questionnaire was completed by the patient on 03/06/23; I have confirmed that all information answered by patient is correct and no changes since this date.   Because this visit was a virtual/telehealth visit, some criteria may be missing or patient reported. Any vitals not documented were not able to be obtained and vitals that have been documented are patient reported.    Cardiac Risk Factors include: advanced age (>16men, >4 women);dyslipidemia;hypertension;obesity (BMI >30kg/m2)     Objective:    Today's Vitals   03/13/23 1023  Weight: 199 lb (90.3 kg)   Body mass index is 38.86 kg/m.     03/13/2023   10:30 AM 02/28/2022    9:00 AM 02/19/2021    8:58 AM 01/21/2020   11:34 AM 06/08/2019    5:43 PM 01/19/2019    1:50 PM 07/31/2016   10:43 AM  Advanced Directives  Does Patient Have a Medical Advance Directive? Yes Yes Yes Yes No Yes No  Type of Estate agent of [place];Living will Healthcare Power of [place];Living will Healthcare Power of Attorney Living will  Living will;Healthcare Power of Attorney   Does patient want to make changes to medical advance directive?      No - Patient declined   Copy of  Healthcare Power of Attorney in Chart? No - copy requested No - copy requested No - copy requested   No - copy requested   Would patient like information on creating a medical advance directive?       No - Patient declined    Current Medications (verified) Outpatient Encounter Medications as of 03/13/2023  Medication Sig   amLODipine (NORVASC) 2.5 MG tablet TAKE 1 TABLET BY MOUTH EVERY DAY   aspirin 81 MG EC tablet Take 81 mg by mouth daily.   Azelastine HCl 137 MCG/SPRAY SOLN Place into the nose.   budesonide-formoterol (SYMBICORT) 80-4.5 MCG/ACT inhaler Inhale 2 puffs into the lungs 2 (two) times daily.   cromolyn (OPTICROM) 4 % ophthalmic solution 1 drop 4 (four) times daily.   cyclobenzaprine (FLEXERIL) 10 MG tablet Take 10 mg by mouth 3 (three) times daily as needed.   Ferrous Sulfate (IRON PO) Take by mouth. 3 times a week   inclisiran (LEQVIO) 284 MG/1.5ML SOSY injection Inject at 0, 3 months then q 6 months thereafter   levothyroxine (SYNTHROID) 75 MCG tablet TAKE 1 TABLET BY MOUTH EVERY DAY   loratadine (CLARITIN) 10 MG tablet Take 10 mg by mouth daily.   metoprolol succinate (TOPROL-XL) 25 MG 24 hr tablet TAKE ONE TABLET DAILY WITH AN EXTRA HALF TABLET AS NEEDED FOR BREAKTHROUGH PALPITATIONS   omeprazole (PRILOSEC OTC) 20 MG tablet Take 20 mg by mouth as needed.   sertraline (ZOLOFT) 50 MG tablet TAKE 1 TABLET BY MOUTH EVERY DAY   triamcinolone (NASACORT)  55 MCG/ACT AERO nasal inhaler Place 2 sprays into the nose daily.   VITAMIN D PO Take by mouth.   albuterol (VENTOLIN HFA) 108 (90 Base) MCG/ACT inhaler Inhale 2 puffs into the lungs every 4 (four) hours as needed. (Patient not taking: Reported on 03/13/2023)   PULMICORT FLEXHALER 180 MCG/ACT inhaler Inhale into the lungs. (Patient not taking: Reported on 03/13/2023)   No facility-administered encounter medications on file as of 03/13/2023.    Allergies (verified) Atorvastatin, Crestor [rosuvastatin], Molds & smuts,  Pitavastatin, Tetanus toxoids, Latex, Ezetimibe, and Statins   History: Past Medical History:  Diagnosis Date   Allergy    Anxiety    Arthritis    Constipation    Enlarged liver    Generalized headaches    since teenage years- usually can get away with tylenol   Hyperlipidemia    Osteopenia    Thyroid disease    hypothyroidism   Past Surgical History:  Procedure Laterality Date   arthroscopic left knee surgery     DILATION AND CURETTAGE OF UTERUS     monthlong period related. everything was ok   NASAL SINUS SURGERY  1986   Family History  Problem Relation Age of Onset   Stroke Mother        67. never smoker   Hypertension Mother    Other Father        killed in car wreck 2012   Prostate cancer Father    Obesity Sister    Kidney cancer Son    Heart disease Maternal Grandmother    Heart disease Maternal Grandfather    Social History   Socioeconomic History   Marital status: Married    Spouse name: Not on file   Number of children: Not on file   Years of education: Not on file   Highest education level: Some college, no degree  Occupational History   Occupation: Retired  Tobacco Use   Smoking status: Never   Smokeless tobacco: Never  Substance and Sexual Activity   Alcohol use: No   Drug use: Never   Sexual activity: Not on file  Other Topics Concern   Not on file  Social History Narrative   Married. 5 kids total between her and husband.  One son is a Engineer, agricultural.      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        Hobbies: travel but hsuband ill   Social Determinants of Health   Financial Resource Strain: Low Risk  (03/06/2023)   Overall Financial Resource Strain (CARDIA)    Difficulty of Paying Living Expenses: Not hard at all  Food Insecurity: No Food Insecurity (03/06/2023)   Hunger Vital Sign    Worried About Running Out of Food in the Last Year: Never true    Ran Out of Food in the  Last Year: Never true  Transportation Needs: No Transportation Needs (03/06/2023)   PRAPARE - Administrator, Civil Service (Medical): No    Lack of Transportation (Non-Medical): No  Physical Activity: Inactive (03/06/2023)   Exercise Vital Sign    Days of Exercise per Week: 0 days    Minutes of Exercise per Session: 0 min  Stress: No Stress Concern Present (03/06/2023)   [employer] of Occupational Health - Occupational Stress Questionnaire    Feeling of Stress : Not at all  Social Connections: Socially Integrated (03/06/2023)   Social Connection and Isolation Panel [  NHANES]    Frequency of Communication with Friends and Family: Three times a week    Frequency of Social Gatherings with Friends and Family: Three times a week    Attends Religious Services: More than 4 times per year    Active Member of Clubs or Organizations: Yes    Attends Engineer, structural: More than 4 times per year    Marital Status: Married    Tobacco Counseling Counseling given: Not Answered   Clinical Intake:  Pre-visit preparation completed: Yes  Pain : No/denies pain     BMI - recorded: 38.86 Nutritional Status: BMI > 30  Obese Nutritional Risks: None Diabetes: No  How often do you need to have someone help you when you read instructions, pamphlets, or other written materials from your doctor or pharmacy?: 1 - Never  Interpreter Needed?: No  Information entered by :: Lanier Ensign, LPN   Activities of Daily Living    03/06/2023    9:39 AM  In your present state of health, do you have any difficulty performing the following activities:  Hearing? 0  Vision? 0  Difficulty concentrating or making decisions? 0  Walking or climbing stairs? 0  Dressing or bathing? 0  Doing errands, shopping? 0  Preparing Food and eating ? N  Using the Toilet? N  In the past six months, have you accidently leaked urine? Y  Comment wears a panty liners  Do you have problems with  loss of bowel control? N  Managing your Medications? N  Managing your Finances? N  Housekeeping or managing your Housekeeping? N    Patient Care Team: Shelva Majestic, MD as PCP - General (Family Medicine) Jodelle Red, MD as PCP - Cardiology (Cardiology) Elinor Parkinson, DPM as Consulting Physician (Podiatry) Rennis Golden, Lisette Abu, MD as Consulting Physician (Cardiology) Irena Cords, Enzo Montgomery, MD as Consulting Physician (Allergy and Immunology) Erroll Luna, [facility] (Inactive) (Pharmacist)  Indicate any recent Medical Services you may have received from other than [facility] providers in the past year (date may be approximate).     Assessment:   This is a routine wellness examination for Olivia Bender.  Hearing/Vision screen Hearing Screening - Comments:: Pt denies any hearing issues  Vision Screening - Comments:: Pt follows up with Dr Dione Booze for annual eye exams    Goals Addressed             This Visit's Progress    Patient Stated       Lose weight        Depression Screen    03/13/2023   10:29 AM 09/20/2022    9:40 AM 03/22/2022   10:30 AM 02/28/2022    9:00 AM 11/21/2021   11:32 AM 03/19/2021    9:41 AM 02/19/2021    8:56 AM  PHQ 2/9 Scores  PHQ - 2 Score 0 0 1 0 0 1 0  PHQ- 9 Score   3  0 1     Fall Risk    03/06/2023    9:39 AM 09/20/2022    9:39 AM 02/28/2022    9:01 AM 06/27/2021    3:47 PM 02/19/2021    8:59 AM  Fall Risk   Falls in the past year? 1 0 1 0 1  Number falls in past yr: 0 0 1  1  Injury with Fall? 1 0 0  0  Comment generalized      Risk for fall due to : Impaired vision;Impaired balance/gait No Fall Risks  Impaired vision;Impaired balance/gait No Fall Risks Impaired balance/gait  Follow up Falls prevention discussed Falls evaluation completed Falls prevention discussed  Falls prevention discussed    MEDICARE RISK AT HOME: Medicare Risk at Home Any stairs in or around the home?: Yes If so, are there any without handrails?: No Home free of  loose throw rugs in walkways, pet beds, electrical cords, etc?: Yes Adequate lighting in your home to reduce risk of falls?: Yes Life alert?: No Use of a cane, walker or w/c?: No Grab bars in the bathroom?: Yes Shower chair or bench in shower?: No Elevated toilet seat or a handicapped toilet?: Yes  TIMED UP AND GO:  Was the test performed?  No    Cognitive Function:        03/13/2023   10:31 AM 02/28/2022    9:04 AM 02/19/2021    9:00 AM 01/21/2020   11:40 AM  6CIT Screen  What Year? 0 points 0 points 0 points 0 points  What month? 0 points 0 points 0 points 0 points  What time? 0 points 0 points 0 points   Count back from 20 0 points 0 points 0 points 0 points  Months in reverse 0 points 0 points 0 points 0 points  Repeat phrase 0 points 0 points 0 points 2 points  Total Score 0 points 0 points 0 points     Immunizations Immunization History  Administered Date(s) Administered   Fluad Quad(high Dose 65+) 03/14/2020, 02/19/2021   Fluad Trivalent(High Dose 65+) 02/11/2023   Influenza, High Dose Seasonal PF 04/20/2018, 05/20/2018, 03/04/2019, 06/25/2019   Influenza,inj,Quad PF,6+ Mos 05/22/2017   Influenza-Unspecified 04/17/2014   PFIZER(Purple Top)SARS-COV-2 Vaccination 12/17/2019, 01/05/2020, 01/06/2020, 06/08/2020   Pneumococcal Conjugate-13 07/28/2015   Pneumococcal Polysaccharide-23 07/14/2005, 04/18/2016, 04/18/2017, 11/21/2017, 05/20/2018, 06/25/2019   Zoster Recombinant(Shingrix) 04/20/2019, 09/15/2019   Zoster, Live 12/23/2011    TDAP status: Due, Education has been provided regarding the importance of this vaccine. Advised may receive this vaccine at local pharmacy or Health Dept. Aware to provide a copy of the vaccination record if obtained from local pharmacy or Health Dept. Verbalized acceptance and understanding.  Flu Vaccine status: Up to date  Pneumococcal vaccine status: Up to date  Covid-19 vaccine status: Information provided on how to obtain vaccines.    Qualifies for Shingles Vaccine? Yes   Zostavax completed Yes   Shingrix Completed?: Yes  Screening Tests Health Maintenance  Topic Date Due   MAMMOGRAM  12/23/2023   Medicare Annual Wellness (AWV)  03/12/2024   Colonoscopy  12/07/2024   Pneumonia Vaccine 88+ Years old  Completed   INFLUENZA VACCINE  Completed   DEXA SCAN  Completed   Hepatitis C Screening  Completed   Zoster Vaccines- Shingrix  Completed   HPV VACCINES  Aged Out   DTaP/Tdap/Td  Discontinued   COVID-19 Vaccine  Discontinued    Health Maintenance  There are no preventive care reminders to display for this patient.   Colorectal cancer screening: Type of screening: Colonoscopy. Completed 12/08/19. Repeat every 5 years  Mammogram status: Completed 12/23/22. Repeat every year  Bone Density status: Completed 01/26/21. Results reflect: Bone density results: NORMAL. Repeat every 2 years.   Additional Screening:  Hepatitis C Screening:  Completed 02/24/14  Vision Screening: Recommended annual ophthalmology exams for early detection of glaucoma and other disorders of the eye. Is the patient up to date with their annual eye exam?  Yes  Who is the provider or what is the name of the office in  which the patient attends annual eye exams? Dr Dione Booze  If pt is not established with a provider, would they like to be referred to a provider to establish care? No .   Dental Screening: Recommended annual dental exams for proper oral hygiene   Community Resource Referral / Chronic Care Management: CRR required this visit?  No   CCM required this visit?  No     Plan:     I have personally reviewed and noted the following in the patient's chart:   Medical and social history Use of alcohol, tobacco or illicit drugs  Current medications and supplements including opioid prescriptions. Patient is not currently taking opioid prescriptions. Functional ability and status Nutritional status Physical activity Advanced  directives List of other physicians Hospitalizations, surgeries, and ER visits in previous 12 months Vitals Screenings to include cognitive, depression, and falls Referrals and appointments  In addition, I have reviewed and discussed with patient certain preventive protocols, quality metrics, and best practice recommendations. A written personalized care plan for preventive services as well as general preventive health recommendations were provided to patient.     Marzella Schlein, LPN   [id number]/03/[id number]   After Visit Summary: (MyChart) Due to this being a telephonic visit, the after visit summary with patients personalized plan was offered to patient via MyChart   Nurse Notes: none

## 2023-03-13 NOTE — Patient Instructions (Signed)
Olivia Bender , Thank you for taking time to come for your Medicare Wellness Visit. I appreciate your ongoing commitment to your health goals. Please review the following plan we discussed and let me know if I can assist you in the future.   Referrals/Orders/Follow-Ups/Clinician Recommendations: lose weight Aim for 30 minutes of exercise or brisk walking, 6-8 glasses of water, and 5 servings of fruits and vegetables each day.   This is a list of the screening recommended for you and due dates:  Health Maintenance  Topic Date Due   Mammogram  12/23/2023   Medicare Annual Wellness Visit  03/12/2024   Colon Cancer Screening  12/07/2024   Pneumonia Vaccine  Completed   Flu Shot  Completed   DEXA scan (bone density measurement)  Completed   Hepatitis C Screening  Completed   Zoster (Shingles) Vaccine  Completed   HPV Vaccine  Aged Out   DTaP/Tdap/Td vaccine  Discontinued   COVID-19 Vaccine  Discontinued    Advanced directives: (Copy Requested) Please bring a copy of your health care power of attorney and living will to the office to be added to your chart at your convenience.  Next Medicare Annual Wellness Visit scheduled for next year: Yes

## 2023-04-07 ENCOUNTER — Encounter (HOSPITAL_BASED_OUTPATIENT_CLINIC_OR_DEPARTMENT_OTHER): Payer: Self-pay | Admitting: Cardiology

## 2023-04-07 ENCOUNTER — Ambulatory Visit (INDEPENDENT_AMBULATORY_CARE_PROVIDER_SITE_OTHER): Payer: Medicare Other | Admitting: Cardiology

## 2023-04-07 VITALS — BP 126/80 | HR 86 | Ht 60.5 in | Wt 200.4 lb

## 2023-04-07 DIAGNOSIS — E785 Hyperlipidemia, unspecified: Secondary | ICD-10-CM | POA: Diagnosis not present

## 2023-04-07 DIAGNOSIS — E6609 Other obesity due to excess calories: Secondary | ICD-10-CM | POA: Diagnosis not present

## 2023-04-07 DIAGNOSIS — Z8249 Family history of ischemic heart disease and other diseases of the circulatory system: Secondary | ICD-10-CM | POA: Diagnosis not present

## 2023-04-07 DIAGNOSIS — I471 Supraventricular tachycardia, unspecified: Secondary | ICD-10-CM

## 2023-04-07 DIAGNOSIS — E66812 Obesity, class 2: Secondary | ICD-10-CM | POA: Diagnosis not present

## 2023-04-07 DIAGNOSIS — R002 Palpitations: Secondary | ICD-10-CM

## 2023-04-07 DIAGNOSIS — Z6838 Body mass index (BMI) 38.0-38.9, adult: Secondary | ICD-10-CM | POA: Diagnosis not present

## 2023-04-07 DIAGNOSIS — Z7189 Other specified counseling: Secondary | ICD-10-CM

## 2023-04-07 DIAGNOSIS — I2510 Atherosclerotic heart disease of native coronary artery without angina pectoris: Secondary | ICD-10-CM | POA: Diagnosis not present

## 2023-04-07 NOTE — Patient Instructions (Signed)

## 2023-04-07 NOTE — Progress Notes (Signed)
Cardiology Office Note:  .   Date:  04/07/2023  ID:  Olivia Bender, DOB [birth year], MRN [id number] PCP: Shelva Majestic, MD  [facility] Providers Cardiologist:  Jodelle Red, MD {  History of Present Illness: .   Olivia Bender is a 72 y.o. female  with a hx of hyperlipidemia, hypothyroidism, arthritis, GERD, depression, and anxiety, who is seen for follow up today. I initially met her 04/25/21 as a new consult at the request of Shelva Majestic, MD for the evaluation and management of palpitations.   Pertinent CV history: She has followed with Dr. 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 She is on Praluent. Ca score 2020 with score of 7/21st percentile. Monitor in 2022 showed brief episodes of SVT  Today: Last seen by Gillian Shields 10/2022, started on metoprolol for palpitations/paroxysmal SVT. Discussed PCSK9i, praluent had been too expensive, she was started on inclisiran 01/2023.  Has very rare, fleeting chest pain, lasts only seconds, not bothersome. Palpitations less frequent on metoprolol. Rarely has episodes where her palpitations last 5-6 minutes, more at night, happening about 2-3 times/month.  On inclisiran now, next dose due in February. Makes her tired for a few days but otherwise no issues.  ROS: Denies shortness of breath at rest or with normal exertion. No PND, orthopnea, LE edema or unexpected weight gain. No syncope. ROS otherwise negative except as noted.   Studies Reviewed: Marland Kitchen    EKG:       Physical Exam:   VS:  BP 126/80 (BP Location: Left Arm, Patient Position: Sitting, Cuff Size: Normal)   Pulse 86   Ht 5' 0.5" (1.537 m)   Wt 200 lb 6.4 oz (90.9 kg)   SpO2 96%   BMI 38.49 kg/m    Wt Readings from Last 3 Encounters:  04/07/23 200 lb 6.4 oz (90.9 kg)  03/13/23 199 lb (90.3 kg)  01/20/23 204 lb 3.2 oz (92.6 kg)    GEN: Well nourished, well developed in no acute distress HEENT: Normal,  moist mucous membranes NECK: No JVD CARDIAC: regular rhythm, normal S1 and S2, no rubs or gallops. No murmur. VASCULAR: Radial and DP pulses 2+ bilaterally. No carotid bruits RESPIRATORY:  Clear to auscultation without rales, wheezing or rhonchi  ABDOMEN: Soft, non-tender, non-distended MUSCULOSKELETAL:  Ambulates independently SKIN: Warm and dry, no edema NEUROLOGIC:  Alert and oriented x 3. No focal neuro deficits noted. PSYCHIATRIC:  Normal affect    ASSESSMENT AND PLAN: .    Palpitations Paroxysmal SVT -noted symptoms after Covid. Started on low dose metoprolol 10/2022, tolerating well with less symptoms. -monitor showed brief paroxysmal SVT, not when she is symptomatic. No high risk features -we discussed red flag warning signs that need immediate medical attention   Dyslipidemia Family history of CAD -concern for familial hypercholesterolemia -praluent was expensive despite grant, started on inclisiran 01/2023 -has f/u with Dr. Durene Cal in 2 weeks, due for her lipids at that visit -on aspirin  Obesity, BMI 38 -discussed lifestyle as below  CV risk counseling and prevention -recommend heart healthy/Mediterranean diet, with whole grains, fruits, vegetable, fish, lean meats, nuts, and olive oil. Limit salt. -recommend moderate walking, 3-5 times/week for 30-50 minutes each session. Aim for at least 150 minutes.week. Goal should be pace of 3 miles/hours, or walking 1.5 miles in 30 minutes -recommend avoidance of tobacco products. Avoid excess alcohol. -ASCVD risk score: The 10-year ASCVD risk score (Arnett DK, et al., 2019) is: 16%  Values used to calculate the score:     Age: 66 years     Sex: Female     Is Non-Hispanic African American: No     Diabetic: No     Tobacco smoker: No     Systolic Blood Pressure: 126 mmHg     Is BP treated: Yes     HDL Cholesterol: 46.1 mg/dL     Total Cholesterol: 243 mg/dL    Dispo: 1 year or sooner as needed  Signed, Jodelle Red, MD   Jodelle Red, MD, PhD, [facility] [place]  [facility]  [place]  [facility] at [facility] [address] [place], [place] [address] [phone]

## 2023-04-08 ENCOUNTER — Other Ambulatory Visit (HOSPITAL_BASED_OUTPATIENT_CLINIC_OR_DEPARTMENT_OTHER): Payer: Self-pay | Admitting: Family

## 2023-04-22 ENCOUNTER — Ambulatory Visit: Payer: Medicare Other | Admitting: Family Medicine

## 2023-06-12 ENCOUNTER — Telehealth: Payer: Self-pay

## 2023-06-12 NOTE — Telephone Encounter (Signed)
 Auth Submission: NO AUTH NEEDED Site of care: Site of care: [facility] Payer: medicare a/b & supp Medication & CPT/J Code(s) submitted: Leqvio  (Inclisiran) J1306 Route of submission (phone, fax, portal):  Phone # Fax # Auth type: Buy/Bill Units/visits requested: 284mg  x 2 doses Reference number:  Approval from: 10/09/22 to 07/03/24   Medicare will cover 80% and mutual of [place] will cover remianing 20%

## 2023-06-21 IMAGING — MR MR HEAD W/O CM
11 series · 48 of 48 positions shown · non-contrast
Comparison: None.

CLINICAL DATA: Headache, new or worsening (Age >= 50y);
technologist note states sharp headache pain for 1-1/2 years.

EXAM:
MRI HEAD WITHOUT CONTRAST
TECHNIQUE: Multiplanar, multiecho pulse sequences of the brain and surrounding
structures were obtained without intravenous contrast.

[Series 5: T1 · sagittal · 4.0mm · 0.75mm/px · 2 of 29 slices shown (1 of 2)]
[im 1/29]
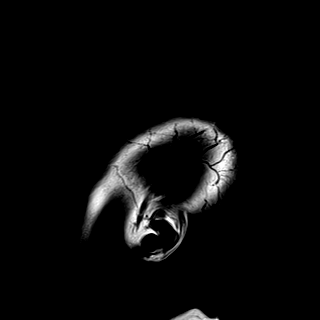
[im 29/29]
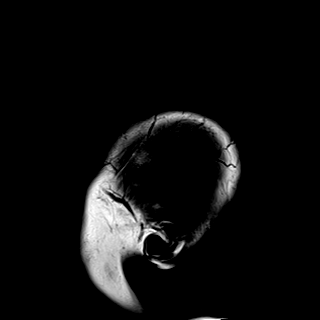

[Series 6: DWI · axial · 3.0mm · 0.94mm/px · z∈[-62,+85]mm · 11 of 168 slices shown (1 of 3)]
[im 1/168]
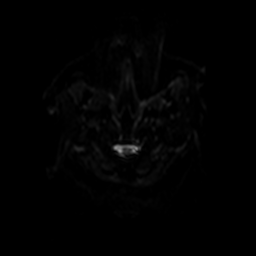
[im 17/168]
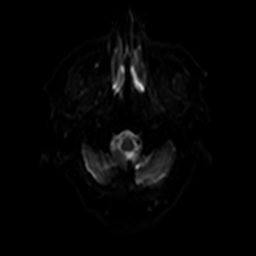
[im 34/168]
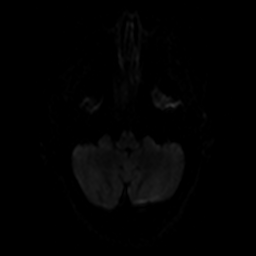
[im 51/168]
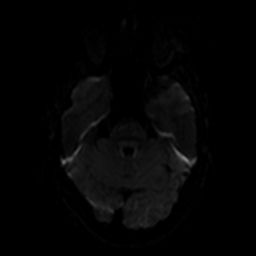
[im 67/168]
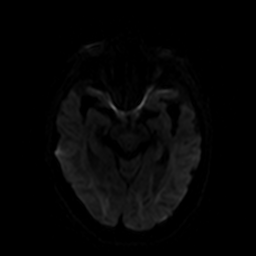
[im 84/168]
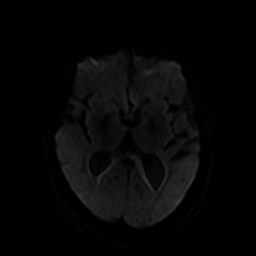
[im 101/168]
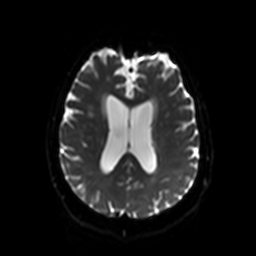
[im 117/168]
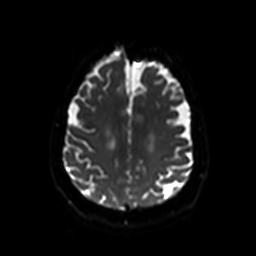
[im 134/168]
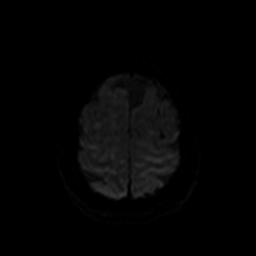
[im 151/168]
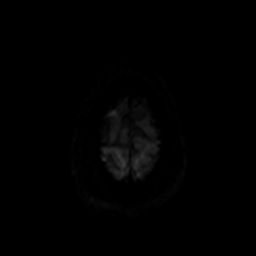
[im 168/168]
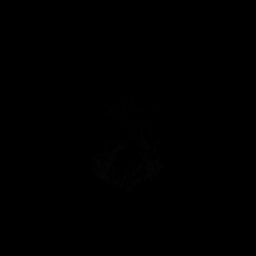

[Series 7: ax dwi_tracew · axial · 3.0mm · 0.94mm/px · z∈[-62,+85]mm · 5 of 84 slices shown]
[im 1/84]
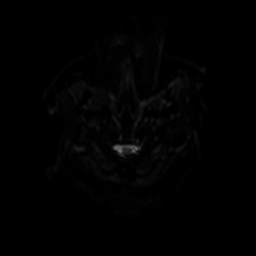
[im 21/84]
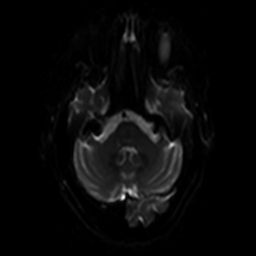
[im 42/84]
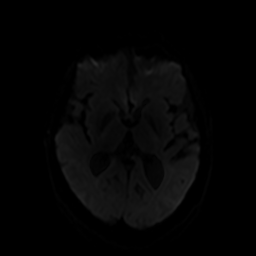
[im 63/84]
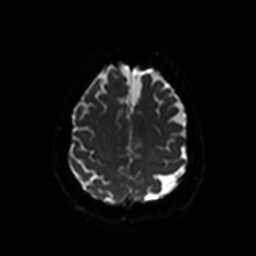
[im 84/84]
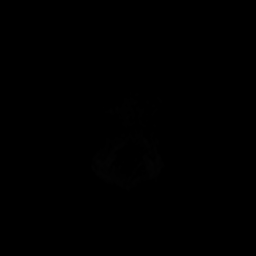

[Series 8: ax dwi_adc · axial · 3.0mm · 0.94mm/px · z∈[-62,+85]mm · 3 of 42 slices shown]
[im 1/42]
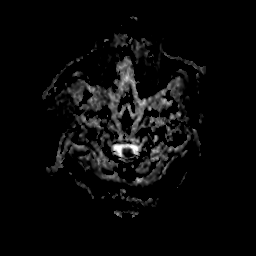
[im 21/42]
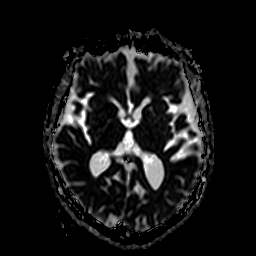
[im 42/42]
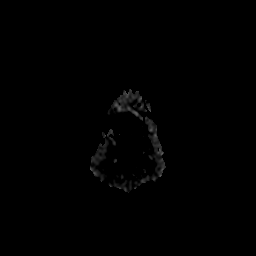

[Series 9: DWI · coronal · 5.0mm · 1.44mm/px · 4 of 64 slices shown (2 of 3)]
[im 1/64]
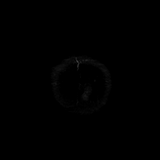
[im 22/64]
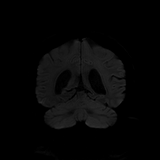
[im 43/64]
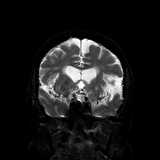
[im 64/64]
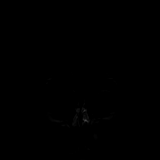

[Series 10: DWI · coronal · 5.0mm · 1.44mm/px · 2 of 32 slices shown (3 of 3)]
[im 1/32]
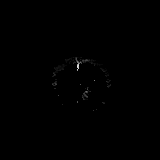
[im 32/32]
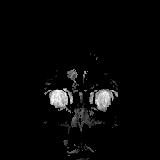

[Series 11: T2 · axial · 4.0mm · 0.36mm/px · z∈[-61,+84]mm · 2 of 29 slices shown (1 of 2)]
[im 1/29]
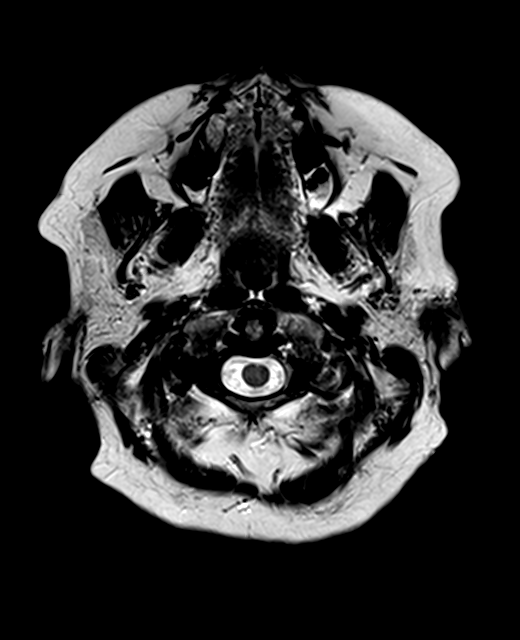
[im 29/29]
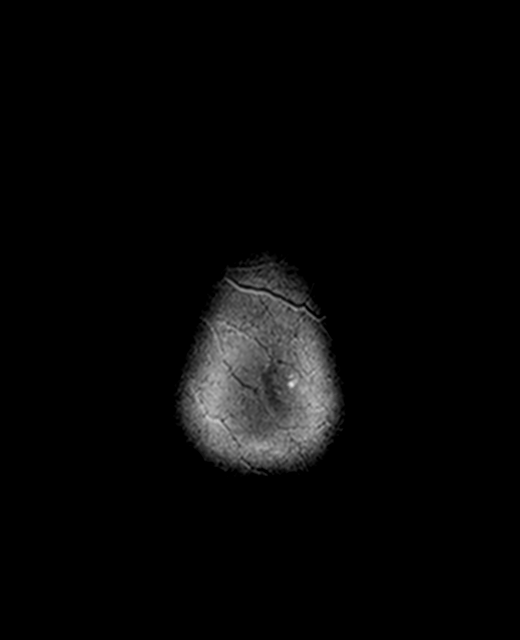

[Series 12: FLAIR · axial · 3.0mm · 0.72mm/px · z∈[-63,+86]mm · 2 of 26 slices shown]
[im 1/26]
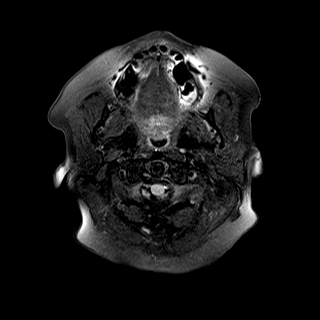
[im 26/26]
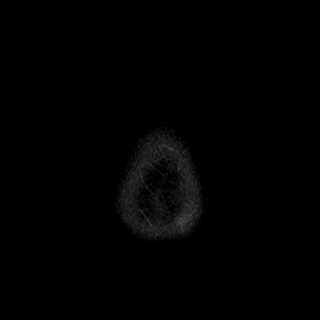

[Series 13: swi_images · axial · 1.5mm · 0.90mm/px · z∈[-60,+82]mm · 6 of 96 slices shown]
[im 1/96]
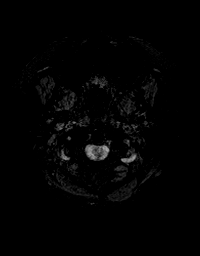
[im 20/96]
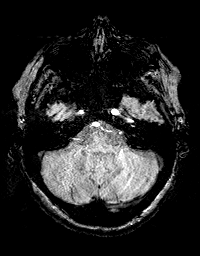
[im 39/96]
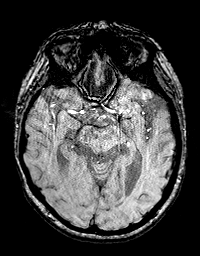
[im 58/96]
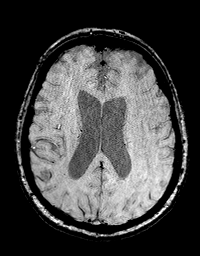
[im 77/96]
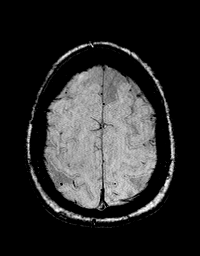
[im 96/96]
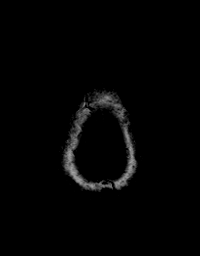

[Series 15: T1 · axial · 1.0mm · 0.94mm/px · z∈[-60,+83]mm · 9 of 144 slices shown (2 of 2)]
[im 1/144]
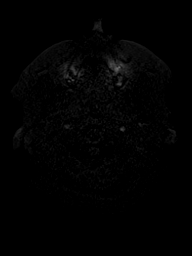
[im 18/144]
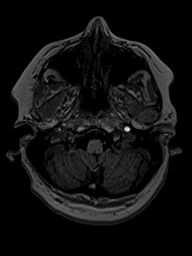
[im 36/144]
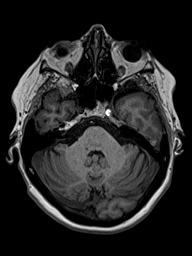
[im 54/144]
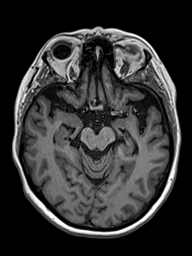
[im 72/144]
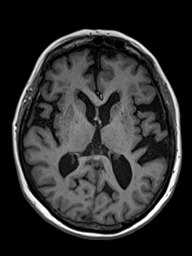
[im 90/144]
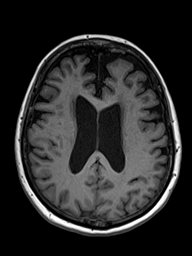
[im 108/144]
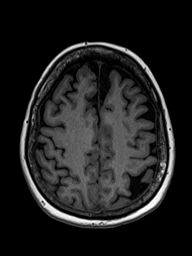
[im 126/144]
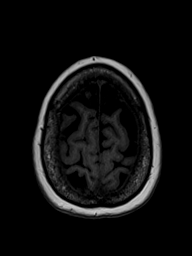
[im 144/144]
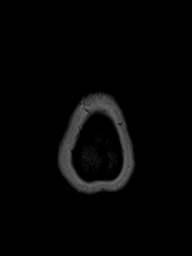

[Series 16: T2 · coronal · 4.5mm · 0.36mm/px · 2 of 30 slices shown (2 of 2)]
[im 1/30]
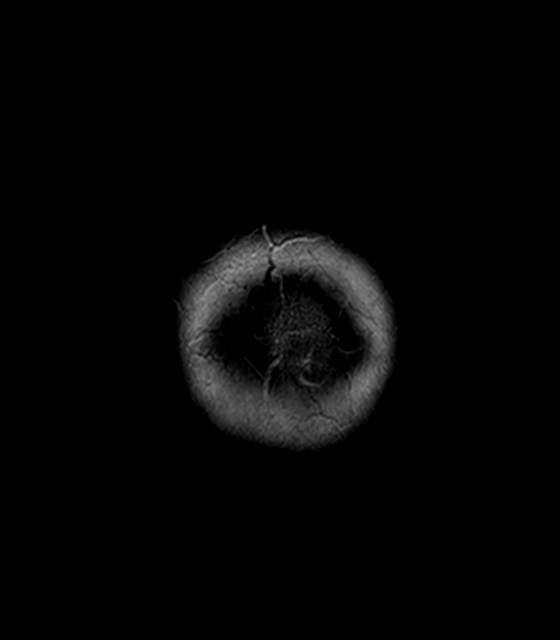
[im 30/30]
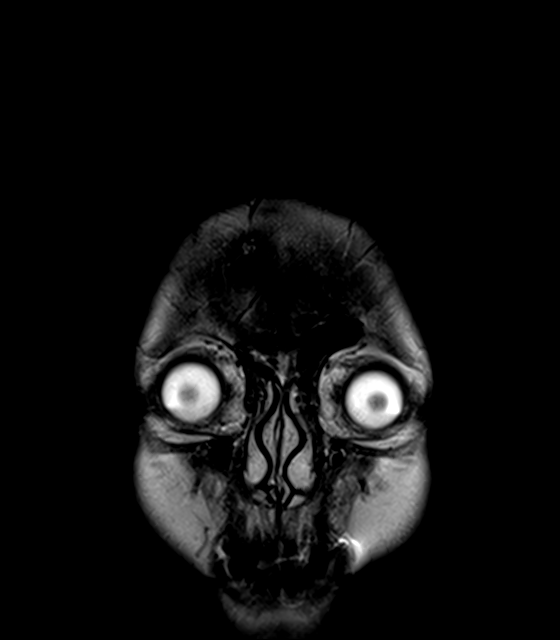

[48 of 48 positions shown; findings below may reference images not displayed]

FINDINGS: Brain: There is no acute infarction or intracranial hemorrhage.
There is no parenchymal mass, significant mass effect, or edema.
There is no hydrocephalus or extra-axial fluid collection.
Prominence of the ventricles and sulci reflects parenchymal volume
loss. Patchy T2 hyperintensity in the supratentorial and pontine
white matter is nonspecific but probably reflects mild to moderate
chronic microvascular ischemic changes. Mild prominence of the
extra-axial space of the left middle cranial fossa may reflect a
small arachnoid cyst, likely incidental.

Vascular: Major vessel flow voids at the skull base are preserved.

Skull and upper cervical spine: Normal marrow signal is preserved.

Sinuses/Orbits: Paranasal sinuses are aerated. Orbits are
unremarkable.

Other: Sella is unremarkable.  Mastoid air cells are clear.
IMPRESSION: No intracranial mass or hemorrhage.

Mild to moderate chronic microvascular ischemic changes.

## 2023-06-27 ENCOUNTER — Encounter: Payer: Self-pay | Admitting: Family Medicine

## 2023-06-27 ENCOUNTER — Ambulatory Visit (INDEPENDENT_AMBULATORY_CARE_PROVIDER_SITE_OTHER): Payer: Medicare Other

## 2023-06-27 ENCOUNTER — Ambulatory Visit (INDEPENDENT_AMBULATORY_CARE_PROVIDER_SITE_OTHER): Payer: Medicare Other | Admitting: Family Medicine

## 2023-06-27 VITALS — BP 138/70 | HR 80 | Temp 97.0°F | Ht 60.5 in | Wt 193.6 lb

## 2023-06-27 DIAGNOSIS — I700 Atherosclerosis of aorta: Secondary | ICD-10-CM

## 2023-06-27 DIAGNOSIS — E039 Hypothyroidism, unspecified: Secondary | ICD-10-CM

## 2023-06-27 DIAGNOSIS — M546 Pain in thoracic spine: Secondary | ICD-10-CM | POA: Diagnosis not present

## 2023-06-27 DIAGNOSIS — G8929 Other chronic pain: Secondary | ICD-10-CM

## 2023-06-27 DIAGNOSIS — Z131 Encounter for screening for diabetes mellitus: Secondary | ICD-10-CM | POA: Diagnosis not present

## 2023-06-27 DIAGNOSIS — I471 Supraventricular tachycardia, unspecified: Secondary | ICD-10-CM | POA: Diagnosis not present

## 2023-06-27 DIAGNOSIS — I10 Essential (primary) hypertension: Secondary | ICD-10-CM | POA: Diagnosis not present

## 2023-06-27 DIAGNOSIS — F325 Major depressive disorder, single episode, in full remission: Secondary | ICD-10-CM | POA: Diagnosis not present

## 2023-06-27 DIAGNOSIS — E785 Hyperlipidemia, unspecified: Secondary | ICD-10-CM

## 2023-06-27 DIAGNOSIS — R7303 Prediabetes: Secondary | ICD-10-CM

## 2023-06-27 DIAGNOSIS — M549 Dorsalgia, unspecified: Secondary | ICD-10-CM | POA: Diagnosis not present

## 2023-06-27 DIAGNOSIS — M47814 Spondylosis without myelopathy or radiculopathy, thoracic region: Secondary | ICD-10-CM | POA: Diagnosis not present

## 2023-06-27 LAB — CBC WITH DIFFERENTIAL/PLATELET
Basophils Absolute: 0.1 10*3/uL (ref 0.0–0.1)
Basophils Relative: 0.8 % (ref 0.0–3.0)
Eosinophils Absolute: 0.1 10*3/uL (ref 0.0–0.7)
Eosinophils Relative: 1.8 % (ref 0.0–5.0)
HCT: 38.8 % (ref 36.0–46.0)
Hemoglobin: 12.9 g/dL (ref 12.0–15.0)
Lymphocytes Relative: 27 % (ref 12.0–46.0)
Lymphs Abs: 1.8 10*3/uL (ref 0.7–4.0)
MCHC: 33.3 g/dL (ref 30.0–36.0)
MCV: 94.9 fL (ref 78.0–100.0)
Monocytes Absolute: 0.6 10*3/uL (ref 0.1–1.0)
Monocytes Relative: 8.3 % (ref 3.0–12.0)
Neutro Abs: 4.2 10*3/uL (ref 1.4–7.7)
Neutrophils Relative %: 62.1 % (ref 43.0–77.0)
Platelets: 270 10*3/uL (ref 150.0–400.0)
RBC: 4.09 Mil/uL (ref 3.87–5.11)
RDW: 13.5 % (ref 11.5–15.5)
WBC: 6.8 10*3/uL (ref 4.0–10.5)

## 2023-06-27 LAB — TSH: TSH: 1.49 u[IU]/mL (ref 0.35–5.50)

## 2023-06-27 LAB — LIPID PANEL
Cholesterol: 159 mg/dL (ref 0–200)
HDL: 49.8 mg/dL (ref >39.00)
LDL Cholesterol: 76 mg/dL (ref 0–99)
NonHDL: 109.27
Total CHOL/HDL Ratio: 3
Triglycerides: 168 mg/dL — ABNORMAL HIGH (ref 0.0–149.0)
VLDL: 33.6 mg/dL (ref 0.0–40.0)

## 2023-06-27 LAB — COMPREHENSIVE METABOLIC PANEL
ALT: 27 U/L (ref 0–35)
AST: 39 U/L — ABNORMAL HIGH (ref 0–37)
Albumin: 4.4 g/dL (ref 3.5–5.2)
Alkaline Phosphatase: 73 U/L (ref 39–117)
BUN: 12 mg/dL (ref 6–23)
CO2: 27 meq/L (ref 19–32)
Calcium: 9.7 mg/dL (ref 8.4–10.5)
Chloride: 104 meq/L (ref 96–112)
Creatinine, Ser: 0.83 mg/dL (ref 0.40–1.20)
GFR: 70.07 mL/min (ref >60.00)
Glucose, Bld: 91 mg/dL (ref 70–99)
Potassium: 4.3 meq/L (ref 3.5–5.1)
Sodium: 140 meq/L (ref 135–145)
Total Bilirubin: 0.6 mg/dL (ref 0.2–1.2)
Total Protein: 7.7 g/dL (ref 6.0–8.3)

## 2023-06-27 LAB — HEMOGLOBIN A1C: Hgb A1c MFr Bld: 6.1 % (ref 4.6–6.5)

## 2023-06-27 MED ORDER — CYCLOBENZAPRINE HCL 5 MG PO TABS: 5.0000 mg | ORAL_TABLET | Freq: Three times a day (TID) | ORAL | 1 refills | Status: DC | PRN

## 2023-06-27 NOTE — Progress Notes (Signed)
Phone [phone] In person visit   Subjective:   Olivia Bender is a 73 y.o. year old very pleasant female patient who presents for/with See problem oriented charting Chief Complaint  Patient presents with   Back Pain    Pt c/o back pain that occurred during memorial day weekend that has gotten worse.    Past Medical History-  Patient Active Problem List   Diagnosis Date Noted   Paroxysmal SVT (supraventricular tachycardia) (HCC) 06/27/2023    Priority: High   Chronic allergic conjunctivitis 11/05/2021    Priority: Medium    Mild intermittent asthma 11/05/2021    Priority: Medium    Essential hypertension 12/10/2019    Priority: Medium    Aortic atherosclerosis (HCC) 12/11/2018    Priority: Medium    Major depression in full remission (HCC) 05/18/2018    Priority: Medium    Asthma 11/21/2017    Priority: Medium    Generalized headaches     Priority: Medium    Fatty infiltration of liver 12/30/2014    Priority: Medium    Prediabetes 06/30/2014    Priority: Medium    Hypothyroidism 12/01/2010    Priority: Medium    Hyperlipidemia 12/01/2010    Priority: Medium    Osteopenia 12/01/2010    Priority: Medium    GE reflux 12/01/2010    Priority: Medium    Trochanteric bursitis of left hip 12/11/2015    Priority: Low   Statin intolerance 06/30/2014    Priority: Low   Left knee pain 06/30/2014    Priority: Low   Allergic rhinitis 12/01/2010    Priority: Low   Pulmonary nodule/lesion, solitary 02/21/2020    Medications- reviewed and updated Current Outpatient Medications  Medication Sig Dispense Refill   albuterol (VENTOLIN HFA) 108 (90 Base) MCG/ACT inhaler Inhale 2 puffs into the lungs every 4 (four) hours as needed.     amLODipine (NORVASC) 2.5 MG tablet TAKE 1 TABLET BY MOUTH EVERY DAY 90 tablet 3   aspirin 81 MG EC tablet Take 81 mg by mouth daily.     Azelastine HCl 137 MCG/SPRAY SOLN Place into the nose.     cromolyn (OPTICROM) 4 % ophthalmic solution 1  drop 4 (four) times daily.     cyclobenzaprine (FLEXERIL) 5 MG tablet Take 1 tablet (5 mg total) by mouth 3 (three) times daily as needed for muscle spasms (dont drive for 6 hours after taking). 30 tablet 1   Ferrous Sulfate (IRON PO) Take by mouth. 3 times a week as needed     inclisiran (LEQVIO) 284 MG/1.5ML SOSY injection Inject at 0, 3 months then q 6 months thereafter 1.5 mL 0   levothyroxine (SYNTHROID) 75 MCG tablet TAKE 1 TABLET BY MOUTH EVERY DAY 90 tablet 3   loratadine (CLARITIN) 10 MG tablet Take 10 mg by mouth daily.     metoprolol succinate (TOPROL-XL) 25 MG 24 hr tablet TAKE ONE TABLET DAILY WITH AN EXTRA HALF TABLET AS NEEDED FOR BREAKTHROUGH PALPITATIONS 135 tablet 1   omeprazole (PRILOSEC OTC) 20 MG tablet Take 20 mg by mouth as needed.     PULMICORT FLEXHALER 180 MCG/ACT inhaler Inhale into the lungs.     sertraline (ZOLOFT) 50 MG tablet TAKE 1 TABLET BY MOUTH EVERY DAY 90 tablet 3   triamcinolone (NASACORT) 55 MCG/ACT AERO nasal inhaler Place 2 sprays into the nose daily.     VITAMIN D PO Take by mouth.     budesonide-formoterol (SYMBICORT) 80-4.5 MCG/ACT inhaler Inhale 2 puffs  into the lungs 2 (two) times daily. (Patient not taking: Reported on 04/07/2023)     No current facility-administered medications for this visit.     Objective:  BP 138/70   Pulse 80   Temp (!) 97 F (36.1 C)   Ht 5' 0.5" (1.537 m)   Wt 193 lb 9.6 oz (87.8 kg)   SpO2 96%   BMI 37.19 kg/m  Gen: NAD, resting comfortably CV: RRR no murmurs rubs or gallops Lungs: CTAB no crackles, wheeze, rhonchi Ext: no edema Skin: warm, dry Back -  Spine with normal alignment and no deformity.  No significant tenderness to vertebral process palpation but is very tender in paraspinous muscles and they appear tight.   Range of motion is full at neck and lumbar sacral regions. Negative Straight leg raise.  Neuro- no saddle anesthesia, 5/5 strength lower extremities     Assessment and Plan   #social update-   lost husband raymond/charles 06/16/23-has been very challenging understandably so  # thoracic Back pain S: Patient reports pain started around [facility] 2024 when she had a fall (was going up front steps and slipped backwards was able to grab something to slow the fall and avoid hitting her head but ended up landing on buttocks and or back)approximately May 2024 but has only gotten worse since that time. Also bruised up both ankles but went on vacation the next week and started feeling better and decided to hold off on coming in. Also got busy caring for her husband who has now passed and even had to do some lifting for him which challenged her back.  - her pain is in thoracic spine area more on the left but can go up and down the back even into lumbar spine. About the same as when it started. About 5/10 pain that comes and goes . Worse with seated position- also hurts to get into squatting position. Tylenol arthritis takes edge off at times. Flexeril from Dr. Lenord Fellers was somewhat helpful.  - has worked with Dr. Charlann Boxer in the past with [facility] but has also seen Dr. Denyse Amass in past -occasional soreness into calf  -Only back imaging on file is from June 05, 1999 and did show some degenerative changes most notable at L4-L5 level with some subluxation at L4  ROS-No saddle anesthesia, bladder incontinence, fecal incontinence, weakness in extremity, numbness or tingling in extremity. History of cancer, fever, chills, unintentional weight loss, recent bacterial infection, recent IV drug use, HIV, pain worse at night or while supine.   A/P: 73 year old female with over 6 months of thoracic back pain-we will get baseline x-rays.  We will refill her Flexeril since this has been helpful and she seems rather tight in the paraspinous muscles.  Discussed possible physical therapy trial if no major structural issues.  If kidney function looks good perhaps short-term NSAID course as long as blood pressure tolerates it-you  need to monitor that at home.  Also strongly considering sports medicine or [facility] visit  # Paroxysmal SVT/palpitations-started after COVID in 2024 #hypertension S: medication: amlodipine 2.5 mg, metoprolol 25 mg extended release BP Readings from Last 3 Encounters:  06/27/23 138/70  04/07/23 126/80  01/20/23 (!) 150/82  A/P: hypertension stable- continue current medicines - although slightly higher than usual- monitor only as high stress with loss of husband SVT- doing much better on metoprolol  #hyperlipidemia #aortic atherosclerosis S: Medication: inclisiran 284 mg every 6 months Lab Results  Component Value Date   CHOL 243 (  H) 03/22/2022   HDL 46.10 03/22/2022   LDLCALC 163 (H) 03/22/2022   LDLDIRECT 184.0 11/21/2017   TRIG 172.0 (H) 03/22/2022   CHOLHDL 5 03/22/2022   A/P: lipids above goal in past- hopefully improved on injection- continue current medications Aortic atherosclerosis (presumed stable)- LDL goal ideally <70 - hopefully at goal- continue current medications   #hypothyroidism S: compliant On thyroid medication- levothyroxine 75 mcg   - far less consistent with recent stressors- ended up moving to evening for a month Lab Results  Component Value Date   TSH 1.84 09/20/2022     A/P:hopefully stable- update tsh today. Continue current meds for now  . Not on ideal timing for medication(s) but only time that has worked for her  # Depression  S: Medication: sertraline 50 mg A/P: phq9 of 6 today but significant recent loss of husband- wants to stay stable  # Hyperglycemia/insulin resistance/prediabetes- a1c up to 5.8 S:  Medication: none Lab Results  Component Value Date   HGBA1C 6.0 09/20/2022   HGBA1C 5.8 03/22/2022   HGBA1C 5.8 03/19/2021  A/P: she is concerned may be worse due to reent eating habits with grieving and foods brought to her  - update a1c today  Recommended follow up: Return in about 6 months (around 12/25/2023) for followup or  sooner if needed.Schedule b4 you leave. Future Appointments  Date Time Provider Department Center  07/24/2023  9:00 AM [facility] 1 [facility] None  03/18/2024 10:20 AM [facility] ANNUAL WELLNESS VISIT 1 [facility]   Lab/Order associations:   ICD-10-CM   1. Chronic left-sided thoracic back pain  M54.6 DG Thoracic Spine 2 View   G89.29 CANCELED: DG Thoracic Spine 2 View    2. Hypothyroidism, unspecified type  E03.9 TSH    3. Hyperlipidemia, unspecified hyperlipidemia type  E78.5 Comprehensive metabolic panel    CBC with Differential/Platelet    Lipid panel    4. Major depressive disorder with single episode, in full remission (HCC)  F32.5     5. Essential hypertension  I10     6. Aortic atherosclerosis (HCC)  I70.0     7. Paroxysmal SVT (supraventricular tachycardia) (HCC)  I47.10     8. Prediabetes  R73.03 HgB A1c    9. Screening for diabetes mellitus  Z13.1 HgB A1c      Meds ordered this encounter  Medications   cyclobenzaprine (FLEXERIL) 5 MG tablet    Sig: Take 1 tablet (5 mg total) by mouth 3 (three) times daily as needed for muscle spasms (dont drive for 6 hours after taking).    Dispense:  30 tablet    Refill:  1    Return precautions advised.  Tana Conch, MD

## 2023-06-27 NOTE — Patient Instructions (Addendum)
Please stop by lab before you go  ALSO x-ray If you have mychart- we will send your results within 3 business days of [place] receiving them.  If you do not have mychart- we will call you about results within 5 business days of [place] receiving them.  *please also note that you will see labs on mychart as soon as they post. I will later go in and write notes on them- will say "notes from Dr. Durene Cal"   Refill flexeril  Recommended follow up: Return in about 6 months (around 12/25/2023) for followup or sooner if needed.Schedule b4 you leave. -or sooner if needed for back pain -may try other medicines or try physical therapy or refer to sports medicine or orthopedics depending on findings from x-ray and how you do with the flexeril refill

## 2023-07-03 ENCOUNTER — Encounter: Payer: Self-pay | Admitting: Family Medicine

## 2023-07-14 ENCOUNTER — Other Ambulatory Visit (HOSPITAL_BASED_OUTPATIENT_CLINIC_OR_DEPARTMENT_OTHER): Payer: Self-pay | Admitting: Cardiology

## 2023-07-24 ENCOUNTER — Ambulatory Visit: Payer: Medicare Other

## 2023-07-29 ENCOUNTER — Ambulatory Visit (INDEPENDENT_AMBULATORY_CARE_PROVIDER_SITE_OTHER): Payer: Medicare Other | Admitting: *Deleted

## 2023-07-29 VITALS — BP 155/86 | HR 93 | Temp 98.0°F | Resp 18 | Ht 60.5 in | Wt 195.8 lb

## 2023-07-29 DIAGNOSIS — E785 Hyperlipidemia, unspecified: Secondary | ICD-10-CM | POA: Diagnosis not present

## 2023-07-29 DIAGNOSIS — I10 Essential (primary) hypertension: Secondary | ICD-10-CM

## 2023-07-29 DIAGNOSIS — I700 Atherosclerosis of aorta: Secondary | ICD-10-CM

## 2023-07-29 MED ORDER — INCLISIRAN SODIUM 284 MG/1.5ML ~~LOC~~ SOSY
284.0000 mg | PREFILLED_SYRINGE | Freq: Once | SUBCUTANEOUS | Status: AC
  Administered 2023-07-29: 284 mg via SUBCUTANEOUS
  Filled 2023-07-29: qty 1.5

## 2023-07-29 NOTE — Progress Notes (Signed)
 Diagnosis: Hyperlipidemia  Provider:  Chilton Greathouse MD  Procedure: Injection  Leqvio (inclisiran), Dose: 284 mg, Site: subcutaneous, Number of injections: 1  Injection Site(s): Left arm  Post Care: Observation period completed  Discharge: Condition: Good, Destination: Home . AVS Provided  Performed by:  Forrest Moron, RN

## 2023-08-25 ENCOUNTER — Ambulatory Visit (INDEPENDENT_AMBULATORY_CARE_PROVIDER_SITE_OTHER): Admitting: Family

## 2023-08-25 ENCOUNTER — Ambulatory Visit: Payer: Self-pay | Admitting: *Deleted

## 2023-08-25 VITALS — BP 140/84 | HR 74 | Temp 97.7°F | Ht 60.5 in | Wt 192.0 lb

## 2023-08-25 DIAGNOSIS — N3090 Cystitis, unspecified without hematuria: Secondary | ICD-10-CM

## 2023-08-25 DIAGNOSIS — K5909 Other constipation: Secondary | ICD-10-CM | POA: Diagnosis not present

## 2023-08-25 LAB — POCT URINALYSIS DIPSTICK
Bilirubin, UA: NEGATIVE
Blood, UA: POSITIVE — AB
Glucose, UA: NEGATIVE
Ketones, UA: NEGATIVE
Nitrite, UA: NEGATIVE
Protein, UA: POSITIVE — AB
Spec Grav, UA: 1.015 (ref 1.010–1.025)
Urobilinogen, UA: 0.2 U/dL
pH, UA: 6.5 (ref 5.0–8.0)

## 2023-08-25 MED ORDER — NITROFURANTOIN MONOHYD MACRO 100 MG PO CAPS: 100.0000 mg | ORAL_CAPSULE | Freq: Two times a day (BID) | ORAL | 0 refills | Status: DC

## 2023-08-25 NOTE — Telephone Encounter (Signed)
 Copied from CRM [id number]. Topic: Clinical - Pink Word Triage >> Aug 25, 2023  9:12 AM Kathryne Eriksson wrote: Reason for Triage: Possible UTI >> Aug 25, 2023  9:14 AM Kathryne Eriksson wrote: Patient states she's been experiencing pressure and burning when she urinate. Patient states this has been going on for a little over a week now. Patient call back number is [phone]    Chief Complaint: urinary pain Symptoms: pain, pressure with urination, flank pain Frequency: almost 1 week Pertinent Negatives: Patient denies fever, blood in urine Disposition: [] ED /[] Urgent Care (no appt availability in office) / [x] Appointment(In office/virtual)/ []  [facility]/ [] Home Care/ [] Refused Recommended Disposition /[] [facility]/ []  Follow-up with PCP Additional Notes: Appointment scheduled.     Reason for Disposition  Age > 50 years  Answer Assessment - Initial Assessment Questions 1. SEVERITY: "How bad is the pain?"  (e.g., Scale 1-10; mild, moderate, or severe)   - MILD (1-3): complains slightly about urination hurting   - MODERATE (4-7): interferes with normal activities     - SEVERE (8-10): excruciating, unwilling or unable to urinate because of the pain      6/10 2. FREQUENCY: "How many times have you had painful urination today?"      Pressure- once 3. PATTERN: "Is pain present every time you urinate or just sometimes?"      Not every time 4. ONSET: "When did the painful urination start?"      Middle of last week 5. FEVER: "Do you have a fever?" If Yes, ask: "What is your temperature, how was it measured, and when did it start?"     Low grade 6. PAST UTI: "Have you had a urine infection before?" If Yes, ask: "When was the last time?" and "What happened that time?"      Yes- long time since last UTI 7. CAUSE: "What do you think is causing the painful urination?"  (e.g., UTI, scratch, Herpes sore)     UTI 8. OTHER SYMPTOMS: "Do you have any other symptoms?" (e.g., blood in  urine, flank pain, genital sores, urgency, vaginal discharge)     Flank pain  Protocols used: Urination Pain - Female-A-AH

## 2023-08-25 NOTE — Progress Notes (Signed)
 Patient ID: Olivia Bender, female    DOB: [birth year], 73 y.o.   MRN: [id number]  Chief Complaint  Patient presents with   Urinary Frequency    Pt c/o dyuria, pelvic pressure and cloudy urine. Present for 1 week.         Discussed the use of AI scribe software for clinical note transcription with the patient, who gave verbal consent to proceed.  History of Present Illness She reports a change in urine color to a darker shade and increased urgency and frequency of urination. The patient has not had a UTI in a while and attributes the current symptoms to a recent change in fluid intake, specifically an increase in lemonade consumption over the past three weeks. She also mentions a recent increase in water intake due to a new diet regimen (Weight Watchers), which has exacerbated the urinary symptoms. In addition to the urinary symptoms, the patient also reports constipation. Despite taking laxatives and stool softeners, she only has a bowel movement once a day, which is often hard and can cause rectal bleeding. The patient's constipation has not improved despite the use of over-the-counter remedies.  Assessment & Plan Urinary Tract Infection (UTI) - UTI with normal kidney function. Nitrofurantoin chosen for treatment. Discussed potential for yeast infection from antibiotics and preventive measures. - Prescribe nitrofurantoin 100 mg twice daily for 7 days. Take with food. - Send urine culture. Adjust antibiotic based on results if needed. - Advise increasing water intake to 2 liters daily. - Recommend yogurt or probiotics to prevent yeast infection. -Call office if no improvement in sx.  Constipation - Constipation with hard stools and rectal bleeding. Current regimen ineffective. Suspected fecal impaction. Magnesium citrate suggested. - Recommend magnesium citrate, half bottle at night, remainder next day if no bowel movement. - Reduce stool softeners to 1-2 per night. Avoid extra laxatives  with the magnesium citrate. -Increase water intake to 2-2.5L every day.  Follow-up Plan to review urine culture results before travel. Arrangements for prescription changes if needed during travel. - Monitor urine culture results. Adjust antibiotic if necessary before travel. - Arrange prescription to local [facility] in [facility] if needed.  Subjective:    Outpatient Medications Prior to Visit  Medication Sig Dispense Refill   albuterol (VENTOLIN HFA) 108 (90 Base) MCG/ACT inhaler Inhale 2 puffs into the lungs every 4 (four) hours as needed.     amLODipine (NORVASC) 2.5 MG tablet TAKE 1 TABLET BY MOUTH EVERY DAY 90 tablet 3   aspirin 81 MG EC tablet Take 81 mg by mouth daily.     Azelastine HCl 137 MCG/SPRAY SOLN Place into the nose.     cromolyn (OPTICROM) 4 % ophthalmic solution 1 drop 4 (four) times daily.     cyclobenzaprine (FLEXERIL) 5 MG tablet Take 1 tablet (5 mg total) by mouth 3 (three) times daily as needed for muscle spasms (dont drive for 6 hours after taking). 30 tablet 1   Ferrous Sulfate (IRON PO) Take by mouth. 3 times a week as needed     inclisiran (LEQVIO) 284 MG/1.5ML SOSY injection Inject at 0, 3 months then q 6 months thereafter 1.5 mL 0   levothyroxine (SYNTHROID) 75 MCG tablet TAKE 1 TABLET BY MOUTH EVERY DAY 90 tablet 3   loratadine (CLARITIN) 10 MG tablet Take 10 mg by mouth daily.     metoprolol succinate (TOPROL-XL) 25 MG 24 hr tablet TAKE ONE TABLET DAILY WITH AN EXTRA HALF TABLET AS NEEDED FOR BREAKTHROUGH PALPITATIONS  135 tablet 2   omeprazole (PRILOSEC OTC) 20 MG tablet Take 20 mg by mouth as needed.     PULMICORT FLEXHALER 180 MCG/ACT inhaler Inhale into the lungs.     sertraline (ZOLOFT) 50 MG tablet TAKE 1 TABLET BY MOUTH EVERY DAY 90 tablet 3   triamcinolone (NASACORT) 55 MCG/ACT AERO nasal inhaler Place 2 sprays into the nose daily.     VITAMIN D PO Take by mouth.     budesonide-formoterol (SYMBICORT) 80-4.5 MCG/ACT inhaler Inhale 2 puffs into the lungs  2 (two) times daily. (Patient not taking: Reported on 04/07/2023)     No facility-administered medications prior to visit.   Past Medical History:  Diagnosis Date   Allergy    Anxiety    Arthritis    Constipation    Enlarged liver    Generalized headaches    since teenage years- usually can get away with tylenol   Hyperlipidemia    Osteopenia    Thyroid disease    hypothyroidism   Past Surgical History:  Procedure Laterality Date   arthroscopic left knee surgery     DILATION AND CURETTAGE OF UTERUS     monthlong period related. everything was ok   NASAL SINUS SURGERY  1986   Allergies  Allergen Reactions   Atorvastatin     Other reaction(s): myalgias (muscle pain)   Crestor [Rosuvastatin]     Other reaction(s): muscle cramps   Molds & Smuts     Other reaction(s): respiratory distress   Pitavastatin     Other reaction(s): muscle cramps   Tetanus Toxoids Anaphylaxis   Latex Rash   Ezetimibe Other (See Comments)    Myalgia   Statins Other (See Comments)    Lipitor, Crestor, Livalo -myalgias      Objective:    Physical Exam Vitals and nursing note reviewed.  Constitutional:      Appearance: Normal appearance.  Cardiovascular:     Rate and Rhythm: Normal rate and regular rhythm.  Pulmonary:     Effort: Pulmonary effort is normal.     Breath sounds: Normal breath sounds.  Musculoskeletal:        General: Normal range of motion.  Skin:    General: Skin is warm and dry.  Neurological:     Mental Status: She is alert.  Psychiatric:        Mood and Affect: Mood normal.        Behavior: Behavior normal.    BP (!) 140/84 (BP Location: Left Arm, Patient Position: Sitting, Cuff Size: Normal)   Pulse 74   Temp 97.7 F (36.5 C) (Temporal)   Ht 5' 0.5" (1.537 m)   Wt 192 lb (87.1 kg)   SpO2 94%   BMI 36.88 kg/m  Wt Readings from Last 3 Encounters:  08/25/23 192 lb (87.1 kg)  07/29/23 195 lb 12.8 oz (88.8 kg)  06/27/23 193 lb 9.6 oz (87.8 kg)        Dulce Sellar, NP

## 2023-08-26 ENCOUNTER — Ambulatory Visit: Admitting: Family Medicine

## 2023-08-26 LAB — URINE CULTURE
MICRO NUMBER:: 16238733
SPECIMEN QUALITY:: ADEQUATE

## 2023-08-27 ENCOUNTER — Encounter: Payer: Self-pay | Admitting: Family

## 2023-09-24 ENCOUNTER — Other Ambulatory Visit: Payer: Self-pay | Admitting: Family Medicine

## 2023-09-26 ENCOUNTER — Encounter: Payer: Self-pay | Admitting: Family

## 2023-09-26 ENCOUNTER — Ambulatory Visit (INDEPENDENT_AMBULATORY_CARE_PROVIDER_SITE_OTHER): Admitting: Family

## 2023-09-26 VITALS — BP 118/78 | HR 68 | Temp 97.0°F | Ht 60.0 in | Wt 185.4 lb

## 2023-09-26 DIAGNOSIS — J4521 Mild intermittent asthma with (acute) exacerbation: Secondary | ICD-10-CM | POA: Diagnosis not present

## 2023-09-26 DIAGNOSIS — J301 Allergic rhinitis due to pollen: Secondary | ICD-10-CM

## 2023-09-26 NOTE — Progress Notes (Signed)
 Patient ID: Olivia Bender, female    DOB: 06-14-1950, 73 y.o.   MRN: [id number]  Chief Complaint  Patient presents with   Cough    Cough, nasal drainage(yellow) and sore throat. Present for 3 days. Has tried nasal spray which does help and tylenol which did not help. Covid negative at home Tuesday.   Discussed the use of AI scribe software for clinical note transcription with the patient, who gave verbal consent to proceed.  History of Present Illness The patient, with a history of allergies and asthma, presents with a cough and sore throat that started on Tuesday. She reports that her throat was initially very sore, but has since improved. She has been taking over-the-counter sinus medication and using her prescribed nasal sprays (Azelastine and Nasacort ) daily, which she reports helps with her symptoms. She also mentions that her ears itch and feel like they are draining. She denies any shortness of breath or wheezing. She has not been using her albuterol rescue inhaler or her Pulmicort inhaler due to cost and states her Asthma provider will not call in less expensive inhalers for her. She has some unused Advair and Symbicort inhalers that were given to her, but she has not been using them.  Assessment & Plan Cough Dry cough likely due to viral symptoms and allergic rhinitis, exacerbated by pollen. - Advised to use either the Symbicort inhaler, 2 puffs twice daily or the Advair, 1 puff daily once daily for the next 1-2 weeks. Do not use both. Ok to continue Albuterol inhaler prn. - Continue nasal sprays (Vaselastine and Nasacort ). - Use throat lozenges. - Increase water intake. - Contact if symptoms persist by next Tuesday or Wednesday.  Sore throat Improved sore throat, likely viral or allergy, mild erythema noted on exam. - Use throat lozenges. - Increase water intake.  Allergic rhinitis Chronic allergic rhinitis exacerbated by high pollen count. - Continue nasal sprays (Vaselastine  and Nasacort ) daily. - Consider elderberry syrup for immune support.  Subjective:    Outpatient Medications Prior to Visit  Medication Sig Dispense Refill   albuterol (VENTOLIN HFA) 108 (90 Base) MCG/ACT inhaler Inhale 2 puffs into the lungs every 4 (four) hours as needed.     amLODipine  (NORVASC ) 2.5 MG tablet TAKE 1 TABLET BY MOUTH EVERY DAY 90 tablet 3   aspirin 81 MG EC tablet Take 81 mg by mouth daily.     Azelastine HCl 137 MCG/SPRAY SOLN Place into the nose.     cromolyn (OPTICROM) 4 % ophthalmic solution 1 drop 4 (four) times daily.     cyclobenzaprine  (FLEXERIL ) 5 MG tablet Take 1 tablet (5 mg total) by mouth 3 (three) times daily as needed for muscle spasms (dont drive for 6 hours after taking). 30 tablet 1   Ferrous Sulfate (IRON PO) Take by mouth. 3 times a week as needed     inclisiran (LEQVIO ) 284 MG/1.5ML SOSY injection Inject at 0, 3 months then q 6 months thereafter 1.5 mL 0   levothyroxine  (SYNTHROID ) 75 MCG tablet TAKE 1 TABLET BY MOUTH EVERY DAY 90 tablet 3   loratadine (CLARITIN) 10 MG tablet Take 10 mg by mouth daily.     metoprolol  succinate (TOPROL -XL) 25 MG 24 hr tablet TAKE ONE TABLET DAILY WITH AN EXTRA HALF TABLET AS NEEDED FOR BREAKTHROUGH PALPITATIONS 135 tablet 2   omeprazole (PRILOSEC OTC) 20 MG tablet Take 20 mg by mouth as needed.     PULMICORT FLEXHALER 180 MCG/ACT inhaler Inhale into the  lungs.     sertraline  (ZOLOFT ) 50 MG tablet TAKE 1 TABLET BY MOUTH EVERY DAY 90 tablet 3   triamcinolone  (NASACORT ) 55 MCG/ACT AERO nasal inhaler Place 2 sprays into the nose daily.     VITAMIN D  PO Take by mouth.     nitrofurantoin , macrocrystal-monohydrate, (MACROBID ) 100 MG capsule Take 1 capsule (100 mg total) by mouth 2 (two) times daily. (Patient not taking: Reported on 09/26/2023) 14 capsule 0   No facility-administered medications prior to visit.   Past Medical History:  Diagnosis Date   Allergy    Anxiety    Arthritis    Constipation    Enlarged liver     Generalized headaches    since teenage years- usually can get away with tylenol   Hyperlipidemia    Osteopenia    Thyroid  disease    hypothyroidism   Past Surgical History:  Procedure Laterality Date   arthroscopic left knee surgery     DILATION AND CURETTAGE OF UTERUS     monthlong period related. everything was ok   NASAL SINUS SURGERY  1986   Allergies  Allergen Reactions   Atorvastatin     Other reaction(s): myalgias (muscle pain)   Crestor  [Rosuvastatin ]     Other reaction(s): muscle cramps   Molds & Smuts     Other reaction(s): respiratory distress   Pitavastatin     Other reaction(s): muscle cramps   Tetanus Toxoids Anaphylaxis   Latex Rash   Ezetimibe  Other (See Comments)    Myalgia   Statins Other (See Comments)    Lipitor, Crestor , Livalo -myalgias      Objective:    Physical Exam Vitals and nursing note reviewed.  Constitutional:      Appearance: Normal appearance. She is not ill-appearing.     Interventions: Face mask in place.  HENT:     Right Ear: Tympanic membrane and ear canal normal.     Left Ear: Tympanic membrane and ear canal normal.     Nose:     Right Sinus: No frontal sinus tenderness.     Left Sinus: No frontal sinus tenderness.     Mouth/Throat:     Mouth: Mucous membranes are moist.     Pharynx: Posterior oropharyngeal erythema (mild) present. No pharyngeal swelling, oropharyngeal exudate or uvula swelling.     Tonsils: No tonsillar exudate or tonsillar abscesses.  Cardiovascular:     Rate and Rhythm: Normal rate and regular rhythm.  Pulmonary:     Effort: Pulmonary effort is normal.     Breath sounds: Examination of the right-upper field reveals wheezing. Examination of the left-upper field reveals wheezing. Examination of the right-middle field reveals wheezing. Examination of the left-middle field reveals wheezing. Wheezing (mild) present.  Musculoskeletal:        General: Normal range of motion.  Lymphadenopathy:     Head:      Right side of head: No preauricular or posterior auricular adenopathy.     Left side of head: No preauricular or posterior auricular adenopathy.     Cervical: No cervical adenopathy.  Skin:    General: Skin is warm and dry.  Neurological:     Mental Status: She is alert.  Psychiatric:        Mood and Affect: Mood normal.        Behavior: Behavior normal.    BP 118/78 (BP Location: Left Arm, Patient Position: Sitting, Cuff Size: Large)   Pulse 68   Temp (!) 97 F (36.1 C) (  Temporal)   Ht 5' (1.524 m)   Wt 185 lb 6 oz (84.1 kg)   SpO2 97%   BMI 36.20 kg/m  Wt Readings from Last 3 Encounters:  09/26/23 185 lb 6 oz (84.1 kg)  08/25/23 192 lb (87.1 kg)  07/29/23 195 lb 12.8 oz (88.8 kg)       Versa Gore, NP

## 2023-11-11 ENCOUNTER — Other Ambulatory Visit: Payer: Self-pay | Admitting: Family Medicine

## 2023-11-18 ENCOUNTER — Other Ambulatory Visit: Payer: Self-pay

## 2023-11-18 ENCOUNTER — Encounter: Payer: Self-pay | Admitting: Family Medicine

## 2023-11-18 DIAGNOSIS — Z780 Asymptomatic menopausal state: Secondary | ICD-10-CM

## 2023-11-26 DIAGNOSIS — M1712 Unilateral primary osteoarthritis, left knee: Secondary | ICD-10-CM | POA: Diagnosis not present

## 2023-11-27 ENCOUNTER — Other Ambulatory Visit (HOSPITAL_BASED_OUTPATIENT_CLINIC_OR_DEPARTMENT_OTHER): Payer: Self-pay | Admitting: Gastroenterology

## 2023-11-27 DIAGNOSIS — R1033 Periumbilical pain: Secondary | ICD-10-CM

## 2023-11-27 DIAGNOSIS — K760 Fatty (change of) liver, not elsewhere classified: Secondary | ICD-10-CM | POA: Diagnosis not present

## 2023-11-27 DIAGNOSIS — Z860100 Personal history of colon polyps, unspecified: Secondary | ICD-10-CM | POA: Diagnosis not present

## 2023-11-27 DIAGNOSIS — K5904 Chronic idiopathic constipation: Secondary | ICD-10-CM | POA: Diagnosis not present

## 2023-11-28 ENCOUNTER — Ambulatory Visit (HOSPITAL_COMMUNITY)
Admission: RE | Admit: 2023-11-28 | Discharge: 2023-11-28 | Disposition: A | Discharge status: Home / Self Care | Source: Ambulatory Visit | Attending: Gastroenterology | Admitting: Gastroenterology

## 2023-11-28 DIAGNOSIS — R1033 Periumbilical pain: Secondary | ICD-10-CM | POA: Insufficient documentation

## 2023-11-28 DIAGNOSIS — K429 Umbilical hernia without obstruction or gangrene: Secondary | ICD-10-CM | POA: Diagnosis not present

## 2023-11-28 DIAGNOSIS — K8020 Calculus of gallbladder without cholecystitis without obstruction: Secondary | ICD-10-CM | POA: Diagnosis not present

## 2023-11-28 DIAGNOSIS — R109 Unspecified abdominal pain: Secondary | ICD-10-CM | POA: Diagnosis not present

## 2023-11-28 MED ORDER — IOHEXOL 300 MG/ML  SOLN
100.0000 mL | Freq: Once | INTRAMUSCULAR | Status: AC | PRN
  Administered 2023-11-28: 100 mL via INTRAVENOUS

## 2023-11-28 MED ORDER — SODIUM CHLORIDE (PF) 0.9 % IJ SOLN
INTRAMUSCULAR | Status: AC
  Filled 2023-11-28: qty 50

## 2023-12-25 ENCOUNTER — Ambulatory Visit (INDEPENDENT_AMBULATORY_CARE_PROVIDER_SITE_OTHER): Payer: Medicare Other | Admitting: Family Medicine

## 2023-12-25 ENCOUNTER — Encounter: Payer: Self-pay | Admitting: Family Medicine

## 2023-12-25 ENCOUNTER — Ambulatory Visit: Payer: Self-pay | Admitting: Family Medicine

## 2023-12-25 VITALS — BP 124/80 | HR 71 | Temp 97.2°F | Ht 60.0 in | Wt 179.2 lb

## 2023-12-25 DIAGNOSIS — I471 Supraventricular tachycardia, unspecified: Secondary | ICD-10-CM

## 2023-12-25 DIAGNOSIS — F325 Major depressive disorder, single episode, in full remission: Secondary | ICD-10-CM | POA: Diagnosis not present

## 2023-12-25 DIAGNOSIS — Z131 Encounter for screening for diabetes mellitus: Secondary | ICD-10-CM

## 2023-12-25 DIAGNOSIS — R300 Dysuria: Secondary | ICD-10-CM | POA: Diagnosis not present

## 2023-12-25 DIAGNOSIS — I10 Essential (primary) hypertension: Secondary | ICD-10-CM

## 2023-12-25 DIAGNOSIS — R739 Hyperglycemia, unspecified: Secondary | ICD-10-CM | POA: Diagnosis not present

## 2023-12-25 DIAGNOSIS — E039 Hypothyroidism, unspecified: Secondary | ICD-10-CM | POA: Diagnosis not present

## 2023-12-25 DIAGNOSIS — E785 Hyperlipidemia, unspecified: Secondary | ICD-10-CM | POA: Diagnosis not present

## 2023-12-25 LAB — COMPREHENSIVE METABOLIC PANEL WITH GFR
ALT: 18 U/L (ref 0–35)
AST: 27 U/L (ref 0–37)
Albumin: 4.1 g/dL (ref 3.5–5.2)
Alkaline Phosphatase: 66 U/L (ref 39–117)
BUN: 13 mg/dL (ref 6–23)
CO2: 28 meq/L (ref 19–32)
Calcium: 9.5 mg/dL (ref 8.4–10.5)
Chloride: 103 meq/L (ref 96–112)
Creatinine, Ser: 0.71 mg/dL (ref 0.40–1.20)
GFR: 84.22 mL/min (ref >60.00)
Glucose, Bld: 82 mg/dL (ref 70–99)
Potassium: 4.2 meq/L (ref 3.5–5.1)
Sodium: 138 meq/L (ref 135–145)
Total Bilirubin: 0.5 mg/dL (ref 0.2–1.2)
Total Protein: 7.3 g/dL (ref 6.0–8.3)

## 2023-12-25 LAB — TSH: TSH: 1.24 u[IU]/mL (ref 0.35–5.50)

## 2023-12-25 LAB — POC URINALSYSI DIPSTICK (AUTOMATED)
Bilirubin, UA: NEGATIVE
Blood, UA: NEGATIVE
Glucose, UA: NEGATIVE
Ketones, UA: NEGATIVE
Leukocytes, UA: NEGATIVE
Nitrite, UA: NEGATIVE
Protein, UA: NEGATIVE
Spec Grav, UA: 1.015 (ref 1.010–1.025)
Urobilinogen, UA: 0.2 U/dL
pH, UA: 6 (ref 5.0–8.0)

## 2023-12-25 LAB — HEMOGLOBIN A1C: Hgb A1c MFr Bld: 5.8 % (ref 4.6–6.5)

## 2023-12-25 NOTE — Progress Notes (Signed)
 Phone [phone] In person visit   Subjective:   Olivia Bender is a 73 y.o. year old very pleasant female patient who presents for/with See problem oriented charting Chief Complaint  Patient presents with   Medical Management of Chronic Issues    Wants to discuss scan from Dr. Kristie   Hypertension   Hypothyroidism   burning with urination    Pt c/o burning with urination x1 month.   Past Medical History-  Patient Active Problem List   Diagnosis Date Noted   Paroxysmal SVT (supraventricular tachycardia) (HCC) 06/27/2023    Priority: High   Chronic allergic conjunctivitis 11/05/2021    Priority: Medium    Mild intermittent asthma 11/05/2021    Priority: Medium    Essential hypertension 12/10/2019    Priority: Medium    Aortic atherosclerosis (HCC) 12/11/2018    Priority: Medium    Major depression in full remission (HCC) 05/18/2018    Priority: Medium    Asthma 11/21/2017    Priority: Medium    Generalized headaches     Priority: Medium    Fatty infiltration of liver 12/30/2014    Priority: Medium    Prediabetes 06/30/2014    Priority: Medium    Hypothyroidism 12/01/2010    Priority: Medium    Hyperlipidemia 12/01/2010    Priority: Medium    Osteopenia 12/01/2010    Priority: Medium    GE reflux 12/01/2010    Priority: Medium    Trochanteric bursitis of left hip 12/11/2015    Priority: Low   Statin intolerance 06/30/2014    Priority: Low   Left knee pain 06/30/2014    Priority: Low   Allergic rhinitis 12/01/2010    Priority: Low   Pulmonary nodule/lesion, solitary 02/21/2020    Medications- reviewed and updated Current Outpatient Medications  Medication Sig Dispense Refill   albuterol (VENTOLIN HFA) 108 (90 Base) MCG/ACT inhaler Inhale 2 puffs into the lungs every 4 (four) hours as needed.     amLODipine  (NORVASC ) 2.5 MG tablet TAKE 1 TABLET BY MOUTH EVERY DAY 90 tablet 3   aspirin 81 MG EC tablet Take 81 mg by mouth daily.     Azelastine HCl 137  MCG/SPRAY SOLN Place into the nose.     cromolyn (OPTICROM) 4 % ophthalmic solution 1 drop 4 (four) times daily.     cyclobenzaprine  (FLEXERIL ) 5 MG tablet Take 1 tablet (5 mg total) by mouth 3 (three) times daily as needed for muscle spasms (dont drive for 6 hours after taking). 30 tablet 1   Ferrous Sulfate (IRON PO) Take by mouth. 3 times a week as needed     inclisiran (LEQVIO ) 284 MG/1.5ML SOSY injection Inject at 0, 3 months then q 6 months thereafter 1.5 mL 0   levothyroxine  (SYNTHROID ) 75 MCG tablet TAKE 1 TABLET BY MOUTH EVERY DAY 90 tablet 3   loratadine (CLARITIN) 10 MG tablet Take 10 mg by mouth daily.     metoprolol  succinate (TOPROL -XL) 25 MG 24 hr tablet TAKE ONE TABLET DAILY WITH AN EXTRA HALF TABLET AS NEEDED FOR BREAKTHROUGH PALPITATIONS 135 tablet 2   omeprazole (PRILOSEC OTC) 20 MG tablet Take 20 mg by mouth as needed.     PULMICORT FLEXHALER 180 MCG/ACT inhaler Inhale into the lungs.     sertraline  (ZOLOFT ) 50 MG tablet TAKE 1 TABLET BY MOUTH EVERY DAY 90 tablet 3   triamcinolone  (NASACORT ) 55 MCG/ACT AERO nasal inhaler Place 2 sprays into the nose daily.     VITAMIN D   PO Take by mouth.     No current facility-administered medications for this visit.     Objective:  BP 124/80   Pulse 71   Temp (!) 97.2 F (36.2 C)   Ht 5' (1.524 m)   Wt 179 lb 3.2 oz (81.3 kg)   SpO2 98%   BMI 35.00 kg/m  Gen: NAD, resting comfortably CV: RRR no murmurs rubs or gallops Lungs: CTAB no crackles, wheeze, rhonchi Ext: no edema Skin: warm, dry Results for orders placed or performed in visit on 12/25/23 (from the past 24 hours)  POCT Urinalysis Dipstick (Automated)     Status: None   Collection Time: 12/25/23 10:12 AM  Result Value Ref Range   Color, UA yellow    Clarity, UA clear    Glucose, UA Negative Negative   Bilirubin, UA neg    Ketones, UA neg    Spec Grav, UA 1.015 1.010 - 1.025   Blood, UA neg    pH, UA 6.0 5.0 - 8.0   Protein, UA Negative Negative   Urobilinogen,  UA 0.2 0.2 or 1.0 E.U./dL   Nitrite, UA neg    Leukocytes, UA Negative Negative       Assessment and Plan   # Social update-appropriately grieving the loss of her amazing husband Quintin   #Concern for UTI S: Patients symptoms started about a month ago.  Complains of dysuria: YES; polyuria: yes; nocturia: worse in last month; urgency: yes. Some incontinence at baseline and wears pads but slightly worse lately Symptoms are pretty stable.  ROS- no fever, chills, nausea, vomiting, flank pain. No blood in urine.   A/P: UA reassuring- still concern for UTI. Will get culture. Empiric treatment will hold off  Patient to follow up if new or worsening symptoms or failure to improve.   # CT scan discussion-recently evaluated by Dr. Clancy have fat containing umbilical hernia and there was Question mild irregular urinary bladder wall thickening-correlate with urinalysis. - we discussed if no urinary tract infection option of referring to urology for their opinion- she is open to that  #umbilical hernia- she wants to monitor for now but if worsens in pain can refer to general surgery   # Bone density-scheduled with [facility] on July 28  -did have fall when foster child within the family moved boots into place shouldn't be and she didn't see in dark hall -fell slowly down 3 step stool and backwards until eventually hit top of head- mild intermittent headache(s) since that a week ago- improving. Not on anticoagulants. Offered head CT but as improving we opted to only do this if symptoms worsen  # Paroxysmal SVT/palpitations-started after COVID in 2024 #hypertension S: medication: amlodipine  2.5 mg, metoprolol  25 mg extended release BP Readings from Last 3 Encounters:  12/25/23 124/80  09/26/23 118/78  08/25/23 (!) 140/84  A/P: doing well for both SVT and hypertension - continue current medications   #hyperlipidemia #aortic atherosclerosis S: Medication: Inclisiran through cardiology  injections every 6 months Lab Results  Component Value Date   CHOL 159 06/27/2023   HDL 49.80 06/27/2023   LDLCALC 76 06/27/2023   LDLDIRECT 184.0 11/21/2017   TRIG 168.0 (H) 06/27/2023   CHOLHDL 3 06/27/2023  A/P: aortic atherosclerosis (presumed stable)- LDL goal ideally <70 - very close to ieal goal and down from peak of 200 on LDL- very good progress  #hypothyroidism S: compliant On thyroid  medication- levothyroxine  75 mcg       Lab Results  Component Value Date  TSH 1.49 06/27/2023  A/P:hopefully stable- update tsh today. Continue current meds for now    # Depression  S: Medication: sertraline  50 mg    12/25/2023   10:02 AM 09/26/2023    9:02 AM 06/27/2023   12:57 PM  Depression screen PHQ 2/9  Decreased Interest 3 2 2   Down, Depressed, Hopeless 3 2 2   PHQ - 2 Score 6 4 4   Altered sleeping 1 0 0  Tired, decreased energy 1 1 1   Change in appetite 0 0 0  Feeling bad or failure about yourself  0 0 0  Trouble concentrating 0 1 1  Moving slowly or fidgety/restless 0 0 0  Suicidal thoughts 0 0 0  PHQ-9 Score 8 6 6   Difficult doing work/chores Somewhat difficult Not difficult at all Not difficult at all  A/P: reasonable control given grieving process after losing husband 6 months ago- continue current medications   # Hyperglycemia/insulin resistance/prediabetes- a1c up to 6.1 S:  Medication: none Exercise and diet- down 14 lbs from last visit and down even further from peak of 212- congratulated her. Doing weight wtachers      Lab Results  Component Value Date   HGBA1C 6.1 06/27/2023   HGBA1C 6.0 09/20/2022   HGBA1C 5.8 03/22/2022  A/P: hopefully stable or improved with weight loss- update a1c today. Continue current meds for now   # Thoracic back pain-degenerative disc disease on thoracic spine films in 2025- in general tolerable other than has to avoid falls for sure- in particular encourage her to be careful about standing up on stool with not being able of the grab  something   #umbilical hernia- she wants to monitor for now but if worsens in pain can refer to general surgery   # Asthma- Dr. Van Winkle S: Maintenance Medication: Symbicort 80-4.5 2 puffs twice daily- but also SMART therapy A/P: no recent issues- doing well lately- not needing inhaler or nasal drops lately   Recommended follow up: Return in about 6 months (around 06/26/2024) for followup or sooner if needed.Schedule b4 you leave. Future Appointments  Date Time Provider Department Center  01/27/2024  9:30 AM [facility] None  03/18/2024 10:00 AM [facility] ANNUAL WELLNESS VISIT 1 [facility]    Lab/Order associations:   ICD-10-CM   1. Burning with urination  R30.0 POCT Urinalysis Dipstick (Automated)    Urine Culture    2. Paroxysmal SVT (supraventricular tachycardia) (HCC)  I47.10     3. Essential hypertension  I10     4. Hyperlipidemia, unspecified hyperlipidemia type  E78.5 Comprehensive metabolic panel with GFR    5. Hypothyroidism, unspecified type  E03.9 TSH    6. Major depressive disorder with single episode, in full remission (HCC)  F32.5     7. Screening for diabetes mellitus  Z13.1 Hemoglobin A1c    8. Hyperglycemia  R73.9 Hemoglobin A1c      No orders of the defined types were placed in this encounter.   Return precautions advised.  Garnette Lukes, MD

## 2023-12-25 NOTE — Patient Instructions (Addendum)
 Health Maintenance Due  Topic Date Due   MAMMOGRAM  12/23/2023  Scheduled Monday along with DEXA   Please stop by lab before you go If you have mychart- we will send your results within 3 business days of us  receiving them.  If you do not have mychart- we will call you about results within 5 business days of us  receiving them.  *please also note that you will see labs on mychart as soon as they post. I will later go in and write notes on them- will say notes from Dr. Katrinka   Recommended follow up: Return in about 6 months (around 06/26/2024) for followup or sooner if needed.Schedule b4 you leave.

## 2023-12-27 LAB — URINE CULTURE
MICRO NUMBER:: 16741383
Result:: NO GROWTH
SPECIMEN QUALITY:: ADEQUATE

## 2023-12-29 DIAGNOSIS — M8588 Other specified disorders of bone density and structure, other site: Secondary | ICD-10-CM | POA: Diagnosis not present

## 2023-12-29 DIAGNOSIS — R29890 Loss of height: Secondary | ICD-10-CM | POA: Diagnosis not present

## 2023-12-29 DIAGNOSIS — Z1231 Encounter for screening mammogram for malignant neoplasm of breast: Secondary | ICD-10-CM | POA: Diagnosis not present

## 2023-12-29 LAB — HM DEXA SCAN

## 2023-12-29 LAB — HM MAMMOGRAPHY

## 2023-12-31 ENCOUNTER — Ambulatory Visit: Payer: Self-pay | Admitting: Family Medicine

## 2024-01-27 ENCOUNTER — Ambulatory Visit: Payer: Medicare Other

## 2024-01-27 VITALS — BP 148/83 | HR 63 | Temp 97.7°F | Resp 18 | Ht 60.0 in | Wt 177.4 lb

## 2024-01-27 DIAGNOSIS — I10 Essential (primary) hypertension: Secondary | ICD-10-CM | POA: Diagnosis not present

## 2024-01-27 DIAGNOSIS — I700 Atherosclerosis of aorta: Secondary | ICD-10-CM

## 2024-01-27 DIAGNOSIS — E785 Hyperlipidemia, unspecified: Secondary | ICD-10-CM

## 2024-01-27 MED ORDER — INCLISIRAN SODIUM 284 MG/1.5ML ~~LOC~~ SOSY
284.0000 mg | PREFILLED_SYRINGE | Freq: Once | SUBCUTANEOUS | Status: AC
  Administered 2024-01-27: 284 mg via SUBCUTANEOUS
  Filled 2024-01-27: qty 1.5

## 2024-01-27 NOTE — Progress Notes (Signed)
 Diagnosis: Hyperlipidemia  Provider:  Chilton Greathouse MD  Procedure: Injection  Leqvio (inclisiran), Dose: 284 mg, Site: subcutaneous, Number of injections: 1  Injection Site(s): Right arm  Post Care: Patient declined observation  Discharge: Condition: Good, Destination: Home . AVS Provided  Performed by:  Wyvonne Lenz, RN

## 2024-02-05 DIAGNOSIS — J310 Chronic rhinitis: Secondary | ICD-10-CM | POA: Diagnosis not present

## 2024-02-05 DIAGNOSIS — J4520 Mild intermittent asthma, uncomplicated: Secondary | ICD-10-CM | POA: Diagnosis not present

## 2024-02-05 DIAGNOSIS — H1045 Other chronic allergic conjunctivitis: Secondary | ICD-10-CM | POA: Diagnosis not present

## 2024-02-05 DIAGNOSIS — K219 Gastro-esophageal reflux disease without esophagitis: Secondary | ICD-10-CM | POA: Diagnosis not present

## 2024-03-11 DIAGNOSIS — H04123 Dry eye syndrome of bilateral lacrimal glands: Secondary | ICD-10-CM | POA: Diagnosis not present

## 2024-03-11 DIAGNOSIS — H5010 Unspecified exotropia: Secondary | ICD-10-CM | POA: Diagnosis not present

## 2024-03-11 DIAGNOSIS — H10413 Chronic giant papillary conjunctivitis, bilateral: Secondary | ICD-10-CM | POA: Diagnosis not present

## 2024-03-11 DIAGNOSIS — H532 Diplopia: Secondary | ICD-10-CM | POA: Diagnosis not present

## 2024-03-11 DIAGNOSIS — H2513 Age-related nuclear cataract, bilateral: Secondary | ICD-10-CM | POA: Diagnosis not present

## 2024-03-18 ENCOUNTER — Ambulatory Visit: Payer: Medicare Other

## 2024-03-18 VITALS — BP 122/74 | HR 75 | Temp 98.2°F | Ht 61.5 in | Wt 180.0 lb

## 2024-03-18 DIAGNOSIS — Z139 Encounter for screening, unspecified: Secondary | ICD-10-CM | POA: Diagnosis not present

## 2024-03-18 DIAGNOSIS — Z23 Encounter for immunization: Secondary | ICD-10-CM | POA: Diagnosis not present

## 2024-03-18 DIAGNOSIS — Z0000 Encounter for general adult medical examination without abnormal findings: Secondary | ICD-10-CM

## 2024-03-18 NOTE — Progress Notes (Signed)
 Subjective:   Olivia Bender is a 73 y.o. who presents for a Medicare Wellness preventive visit.  As a reminder, Annual Wellness Visits don't include a physical exam, and some assessments may be limited, especially if this visit is performed virtually. We may recommend an in-person follow-up visit with your provider if needed.  Visit Complete: In person    Persons Participating in Visit: Patient.  AWV Questionnaire: Yes: Patient Medicare AWV questionnaire was completed by the patient on 03/16/24; I have confirmed that all information answered by patient is correct and no changes since this date.  Cardiac Risk Factors include: advanced age (>33men, >7 women);dyslipidemia;obesity (BMI >30kg/m2);hypertension     Objective:    Today's Vitals   03/18/24 1003  BP: 122/74  Temp: 98.2 F (36.8 C)  SpO2: (!) 75%  Weight: 180 lb (81.6 kg)  Height: 5' 1.5 (1.562 m)   Body mass index is 33.46 kg/m.     03/18/2024   10:15 AM 03/13/2023   10:30 AM 02/28/2022    9:00 AM 02/19/2021    8:58 AM 01/21/2020   11:34 AM 06/08/2019    5:43 PM 01/19/2019    1:50 PM  Advanced Directives  Does Patient Have a Medical Advance Directive? Yes Yes Yes Yes Yes No Yes  Type of Estate agent of [place];Living will Healthcare Power of [place];Living will Healthcare Power of [place];Living will Healthcare Power of Attorney Living will  Living will;Healthcare Power of Attorney  Does patient want to make changes to medical advance directive?       No - Patient declined   Copy of Healthcare Power of Attorney in Chart? No - copy requested No - copy requested No - copy requested No - copy requested   No - copy requested      Data saved with a previous flowsheet row definition    Current Medications (verified) Outpatient Encounter Medications as of 03/18/2024  Medication Sig   albuterol (VENTOLIN HFA) 108 (90 Base) MCG/ACT inhaler Inhale 2 puffs into the lungs every 4 (four) hours as  needed.   amLODipine  (NORVASC ) 2.5 MG tablet TAKE 1 TABLET BY MOUTH EVERY DAY   aspirin 81 MG EC tablet Take 81 mg by mouth daily.   cyclobenzaprine  (FLEXERIL ) 5 MG tablet Take 1 tablet (5 mg total) by mouth 3 (three) times daily as needed for muscle spasms (dont drive for 6 hours after taking).   Ferrous Sulfate (IRON PO) Take by mouth. 3 times a week as needed   inclisiran (LEQVIO ) 284 MG/1.5ML SOSY injection Inject at 0, 3 months then q 6 months thereafter   levocetirizine (XYZAL ALLERGY 24HR) 5 MG tablet take 1 tablet in the evening once a day orally Orally Once a day; Duration: 30 days   levothyroxine  (SYNTHROID ) 75 MCG tablet TAKE 1 TABLET BY MOUTH EVERY DAY   metoprolol  succinate (TOPROL -XL) 25 MG 24 hr tablet TAKE ONE TABLET DAILY WITH AN EXTRA HALF TABLET AS NEEDED FOR BREAKTHROUGH PALPITATIONS   omeprazole (PRILOSEC OTC) 20 MG tablet Take 20 mg by mouth as needed.   Polyethyl Glycol-Propyl Glycol (SYSTANE OP) Apply to eye.   sertraline  (ZOLOFT ) 50 MG tablet TAKE 1 TABLET BY MOUTH EVERY DAY   VITAMIN D  PO Take by mouth.   Azelastine HCl 137 MCG/SPRAY SOLN Place into the nose. (Patient not taking: Reported on 03/18/2024)   PULMICORT FLEXHALER 180 MCG/ACT inhaler Inhale into the lungs. (Patient not taking: Reported on 03/18/2024)   triamcinolone  (NASACORT ) 55 MCG/ACT AERO  nasal inhaler Place 2 sprays into the nose daily. (Patient not taking: Reported on 03/18/2024)   [DISCONTINUED] cromolyn (OPTICROM) 4 % ophthalmic solution 1 drop 4 (four) times daily.   [DISCONTINUED] loratadine (CLARITIN) 10 MG tablet Take 10 mg by mouth daily.   No facility-administered encounter medications on file as of 03/18/2024.    Allergies (verified) Atorvastatin, Crestor  [rosuvastatin ], Molds & smuts, Pitavastatin, Tetanus toxoid-containing vaccines, Latex, Ezetimibe , and Statins   History: Past Medical History:  Diagnosis Date   Allergy 1985   Anxiety    Arthritis    Asthma    Constipation     Depression 2007   Enlarged liver    Generalized headaches    since teenage years- usually can get away with tylenol   GERD (gastroesophageal reflux disease)    Hyperlipidemia    Hypertension July 2022   Osteopenia    Thyroid  disease    hypothyroidism   Past Surgical History:  Procedure Laterality Date   arthroscopic left knee surgery     DILATION AND CURETTAGE OF UTERUS     monthlong period related. everything was ok   NASAL SINUS SURGERY  1986   Family History  Problem Relation Age of Onset   Stroke Mother        26. never smoker   Hypertension Mother    Arthritis Mother    Hearing loss Mother    Kidney disease Mother    Miscarriages / [place] Mother    Obesity Mother    Other Father        killed in car wreck 2012   Prostate cancer Father    Cancer Father    Obesity Sister    Kidney cancer Son    Heart disease Maternal Grandmother    Hypertension Maternal Grandmother    Obesity Maternal Grandmother    Heart disease Maternal Grandfather    Hearing loss Maternal Grandfather    Heart disease Maternal Aunt    Obesity Maternal Aunt    Arthritis Sister    Obesity Sister    ADD / ADHD Son    Social History   Socioeconomic History   Marital status: Widowed    Spouse name: Not on file   Number of children: Not on file   Years of education: Not on file   Highest education level: Some college, no degree  Occupational History   Occupation: Retired  Tobacco Use   Smoking status: Never   Smokeless tobacco: Never  Substance and Sexual Activity   Alcohol use: No   Drug use: Never   Sexual activity: Not on file  Other Topics Concern   Not on file  Social History Narrative   Husband passed 06/2023 married for 35 years . 5 kids total between her and husband.  One son is a Engineer, agricultural.      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        Hobbies: travel but hsuband ill   Social Drivers of Manufacturing engineer Strain: Low Risk  (03/16/2024)   Overall Financial Resource Strain (CARDIA)    Difficulty of Paying Living Expenses: Not hard at all  Food Insecurity: No Food Insecurity (03/16/2024)   Hunger Vital Sign    Worried About Running Out of Food in the Last Year: Never true    Ran Out of Food in the Last Year: Never true  Transportation Needs: No Transportation Needs (03/16/2024)  PRAPARE - Administrator, Civil Service (Medical): No    Lack of Transportation (Non-Medical): No  Physical Activity: Insufficiently Active (03/16/2024)   Exercise Vital Sign    Days of Exercise per Week: 2 days    Minutes of Exercise per Session: 60 min  Stress: No Stress Concern Present (03/16/2024)   [employer] of Occupational Health - Occupational Stress Questionnaire    Feeling of Stress: Not at all  Social Connections: Moderately Isolated (03/16/2024)   Social Connection and Isolation Panel    Frequency of Communication with Friends and Family: More than three times a week    Frequency of Social Gatherings with Friends and Family: Twice a week    Attends Religious Services: More than 4 times per year    Active Member of [employer] or Organizations: No    Attends Banker Meetings: Not on file    Marital Status: Widowed    Tobacco Counseling Counseling given: Not Answered    Clinical Intake:  Pre-visit preparation completed: Yes  Pain : No/denies pain     BMI - recorded: 33.46 Nutritional Status: BMI > 30  Obese Nutritional Risks: None Diabetes: No  Lab Results  Component Value Date   HGBA1C 5.8 12/25/2023   HGBA1C 6.1 06/27/2023   HGBA1C 6.0 09/20/2022     How often do you need to have someone help you when you read instructions, pamphlets, or other written materials from your doctor or pharmacy?: 1 - Never  Interpreter Needed?: No  Information entered by :: Ellouise Haws, LPN   Activities of Daily Living     03/16/2024    3:47  PM  In your present state of health, do you have any difficulty performing the following activities:  Hearing? 0  Vision? 0  Difficulty concentrating or making decisions? 0  Walking or climbing stairs? 0  Dressing or bathing? 0  Doing errands, shopping? 0  Preparing Food and eating ? N  Using the Toilet? N  In the past six months, have you accidently leaked urine? Y  Comment wears a pad  Do you have problems with loss of bowel control? N  Managing your Medications? N  Managing your Finances? N  Housekeeping or managing your Housekeeping? N    Patient Care Team: Katrinka Garnette KIDD, MD as PCP - General (Family Medicine) Lonni Slain, MD as PCP - Cardiology (Cardiology) Verta Royden DASEN, DPM as Consulting Physician (Podiatry) Mona, Vinie BROCKS, MD as Consulting Physician (Cardiology) Fleeta Smock, Lamar BROCKS, MD as Consulting Physician (Allergy and Immunology) Nicholaus Sherlean CROME, [facility] (Inactive) (Pharmacist)  I have updated your Care Teams any recent Medical Services you may have received from other providers in the past year.     Assessment:   This is a routine wellness examination for Olivia Bender.  Hearing/Vision screen Hearing Screening - Comments:: Pt denies any hearing issues  Vision Screening - Comments:: Wears rx glasses - up to date with routine eye exams with Dr Clem Favorite    Goals Addressed             This Visit's Progress    Patient Stated       Lose weight 150lbs       Depression Screen     03/18/2024   10:12 AM 12/25/2023   10:02 AM 09/26/2023    9:02 AM 06/27/2023   12:57 PM 03/13/2023   10:29 AM 09/20/2022    9:40 AM 03/22/2022   10:30 AM  PHQ 2/9 Scores  PHQ - 2 Score 0 6 4 4  0 0 1  PHQ- 9 Score  8 6 6   3     Fall Risk     03/16/2024    3:47 PM 06/27/2023    9:53 AM 03/06/2023    9:39 AM 09/20/2022    9:39 AM 02/28/2022    9:01 AM  Fall Risk   Falls in the past year? 1 1 1  0 1  Number falls in past yr: 1 0 0 0 1  Injury with Fall? 1 0 1  0 0  Comment both knees  generalized    Risk for fall due to : Impaired balance/gait;History of fall(s);Impaired mobility History of fall(s) Impaired vision;Impaired balance/gait No Fall Risks Impaired vision;Impaired balance/gait  Follow up Falls prevention discussed Falls evaluation completed Falls prevention discussed Falls evaluation completed Falls prevention discussed      Data saved with a previous flowsheet row definition    MEDICARE RISK AT HOME:  Medicare Risk at Home Any stairs in or around the home?: (Patient-Rptd) Yes If so, are there any without handrails?: (Patient-Rptd) No Home free of loose throw rugs in walkways, pet beds, electrical cords, etc?: (Patient-Rptd) No Adequate lighting in your home to reduce risk of falls?: (Patient-Rptd) Yes Life alert?: (Patient-Rptd) No Use of a cane, walker or w/c?: (Patient-Rptd) No Grab bars in the bathroom?: (Patient-Rptd) No Shower chair or bench in shower?: (Patient-Rptd) No Elevated toilet seat or a handicapped toilet?: (Patient-Rptd) Yes  TIMED UP AND GO:  Was the test performed?  Yes  Length of time to ambulate 10 feet: 10 sec Gait steady and fast without use of assistive device  Cognitive Function: 6CIT completed        03/18/2024   10:15 AM 03/13/2023   10:31 AM 02/28/2022    9:04 AM 02/19/2021    9:00 AM 01/21/2020   11:40 AM  6CIT Screen  What Year? 0 points 0 points 0 points 0 points 0 points  What month? 0 points 0 points 0 points 0 points 0 points  What time? 0 points 0 points 0 points 0 points   Count back from 20 0 points 0 points 0 points 0 points 0 points  Months in reverse 0 points 0 points 0 points 0 points 0 points  Repeat phrase 0 points 0 points 0 points 0 points 2 points  Total Score 0 points 0 points 0 points 0 points     Immunizations Immunization History  Administered Date(s) Administered   Fluad Quad(high Dose 65+) 03/14/2020, 02/19/2021   Fluad Trivalent(High Dose 65+) 02/11/2023    INFLUENZA, HIGH DOSE SEASONAL PF 04/20/2018, 05/20/2018, 03/04/2019, 06/25/2019, 03/18/2024   Influenza,inj,Quad PF,6+ Mos 05/22/2017   Influenza-Unspecified 04/17/2014   PFIZER(Purple Top)SARS-COV-2 Vaccination 12/17/2019, 01/05/2020, 01/06/2020, 06/08/2020   Pneumococcal Conjugate-13 07/28/2015   Pneumococcal Polysaccharide-23 07/14/2005, 04/18/2016, 04/18/2017, 11/21/2017, 05/20/2018, 06/25/2019, 06/26/2022   Zoster Recombinant(Shingrix) 04/20/2019, 09/15/2019   Zoster, Live 12/23/2011    Screening Tests Health Maintenance  Topic Date Due   Colonoscopy  12/07/2024   Mammogram  12/28/2024   Medicare Annual Wellness (AWV)  03/18/2025   Pneumococcal Vaccine: 50+ Years  Completed   Influenza Vaccine  Completed   DEXA SCAN  Completed   Hepatitis C Screening  Completed   Zoster Vaccines- Shingrix  Completed   Meningococcal B Vaccine  Aged Out   DTaP/Tdap/Td  Discontinued   COVID-19 Vaccine  Discontinued    Health Maintenance Items Addressed: Vaccines Given today: 03/18/24 Flu, See Nurse Notes  at the end of this note  Additional Screening:  Vision Screening: Recommended annual ophthalmology exams for early detection of glaucoma and other disorders of the eye. Is the patient up to date with their annual eye exam?  Yes  Who is the provider or what is the name of the office in which the patient attends annual eye exams? [facility] ophthalmology Dr Clem Brands   Dental Screening: Recommended annual dental exams for proper oral hygiene  Community Resource Referral / Chronic Care Management: CRR required this visit?  No   CCM required this visit?  No   Plan:    I have personally reviewed and noted the following in the patient's chart:   Medical and social history Use of alcohol, tobacco or illicit drugs  Current medications and supplements including opioid prescriptions. Patient is not currently taking opioid prescriptions. Functional ability and status Nutritional  status Physical activity Advanced directives List of other physicians Hospitalizations, surgeries, and ER visits in previous 12 months Vitals Screenings to include cognitive, depression, and falls Referrals and appointments  In addition, I have reviewed and discussed with patient certain preventive protocols, quality metrics, and best practice recommendations. A written personalized care plan for preventive services as well as general preventive health recommendations were provided to patient.   Ellouise VEAR Haws, LPN   [id number]   After Visit Summary: (MyChart) Due to this being a telephonic visit, the after visit summary with patients personalized plan was offered to patient via MyChart   Notes: Nothing significant to report at this time.

## 2024-03-18 NOTE — Patient Instructions (Signed)
 [name],  Thank you for taking the time for your Medicare Wellness Visit. I appreciate your continued commitment to your health goals. Please review the care plan we discussed, and feel free to reach out if I can assist you further.  Medicare recommends these wellness visits once per year to help you and your care team stay ahead of potential health issues. These visits are designed to focus on prevention, allowing your provider to concentrate on managing your acute and chronic conditions during your regular appointments.  Please note that Annual Wellness Visits do not include a physical exam. Some assessments may be limited, especially if the visit was conducted virtually. If needed, we may recommend a separate in-person follow-up with your provider.  Ongoing Care Seeing your primary care provider every 3 to 6 months helps us  monitor your health and provide consistent, personalized care.   Referrals If a referral was made during today's visit and you haven't received any updates within two weeks, please contact the referred provider directly to check on the status.  Recommended Screenings:  Health Maintenance  Topic Date Due   Flu Shot  01/02/2024   Medicare Annual Wellness Visit  03/12/2024   Colon Cancer Screening  12/07/2024   Breast Cancer Screening  12/28/2024   Pneumococcal Vaccine for age over 106  Completed   DEXA scan (bone density measurement)  Completed   Hepatitis C Screening  Completed   Zoster (Shingles) Vaccine  Completed   Meningitis B Vaccine  Aged Out   DTaP/Tdap/Td vaccine  Discontinued   COVID-19 Vaccine  Discontinued       03/13/2023   10:30 AM  Advanced Directives  Does Patient Have a Medical Advance Directive? Yes  Type of Estate agent of [place];Living will  Copy of Healthcare Power of Attorney in Chart? No - copy requested   Advance Care Planning is important because it: Ensures you receive medical care that aligns with your  values, goals, and preferences. Provides guidance to your family and loved ones, reducing the emotional burden of decision-making during critical moments.  Vision: Annual vision screenings are recommended for early detection of glaucoma, cataracts, and diabetic retinopathy. These exams can also reveal signs of chronic conditions such as diabetes and high blood pressure.  Dental: Annual dental screenings help detect early signs of oral cancer, gum disease, and other conditions linked to overall health, including heart disease and diabetes.  Please see the attached documents for additional preventive care recommendations.

## 2024-04-23 ENCOUNTER — Emergency Department (HOSPITAL_BASED_OUTPATIENT_CLINIC_OR_DEPARTMENT_OTHER)

## 2024-04-23 ENCOUNTER — Other Ambulatory Visit: Payer: Self-pay

## 2024-04-23 ENCOUNTER — Ambulatory Visit: Payer: Self-pay

## 2024-04-23 ENCOUNTER — Emergency Department (HOSPITAL_BASED_OUTPATIENT_CLINIC_OR_DEPARTMENT_OTHER)
Admission: EM | Admit: 2024-04-23 | Discharge: 2024-04-23 | Disposition: A | Discharge status: Home / Self Care | Attending: Emergency Medicine | Admitting: Emergency Medicine

## 2024-04-23 DIAGNOSIS — R109 Unspecified abdominal pain: Secondary | ICD-10-CM | POA: Insufficient documentation

## 2024-04-23 DIAGNOSIS — Z7982 Long term (current) use of aspirin: Secondary | ICD-10-CM | POA: Insufficient documentation

## 2024-04-23 DIAGNOSIS — R10A2 Flank pain, left side: Secondary | ICD-10-CM

## 2024-04-23 DIAGNOSIS — W06XXXA Fall from bed, initial encounter: Secondary | ICD-10-CM | POA: Diagnosis not present

## 2024-04-23 DIAGNOSIS — T07XXXA Unspecified multiple injuries, initial encounter: Secondary | ICD-10-CM | POA: Diagnosis not present

## 2024-04-23 DIAGNOSIS — M549 Dorsalgia, unspecified: Secondary | ICD-10-CM | POA: Diagnosis not present

## 2024-04-23 DIAGNOSIS — Z79899 Other long term (current) drug therapy: Secondary | ICD-10-CM | POA: Diagnosis not present

## 2024-04-23 DIAGNOSIS — Z91040 Latex allergy status: Secondary | ICD-10-CM | POA: Diagnosis not present

## 2024-04-23 DIAGNOSIS — D72829 Elevated white blood cell count, unspecified: Secondary | ICD-10-CM | POA: Diagnosis not present

## 2024-04-23 DIAGNOSIS — I771 Stricture of artery: Secondary | ICD-10-CM | POA: Diagnosis not present

## 2024-04-23 DIAGNOSIS — R0989 Other specified symptoms and signs involving the circulatory and respiratory systems: Secondary | ICD-10-CM | POA: Diagnosis not present

## 2024-04-23 DIAGNOSIS — R0789 Other chest pain: Secondary | ICD-10-CM | POA: Diagnosis not present

## 2024-04-23 DIAGNOSIS — R0781 Pleurodynia: Secondary | ICD-10-CM | POA: Diagnosis not present

## 2024-04-23 LAB — CBC WITH DIFFERENTIAL/PLATELET
Abs Immature Granulocytes: 0.03 K/uL (ref 0.00–0.07)
Basophils Absolute: 0 K/uL (ref 0.0–0.1)
Basophils Relative: 0 %
Eosinophils Absolute: 0.1 K/uL (ref 0.0–0.5)
Eosinophils Relative: 1 %
HCT: 36.9 % (ref 36.0–46.0)
Hemoglobin: 12.4 g/dL (ref 12.0–15.0)
Immature Granulocytes: 0 %
Lymphocytes Relative: 13 %
Lymphs Abs: 1.7 K/uL (ref 0.7–4.0)
MCH: 31.5 pg (ref 26.0–34.0)
MCHC: 33.6 g/dL (ref 30.0–36.0)
MCV: 93.7 fL (ref 80.0–100.0)
Monocytes Absolute: 1.2 K/uL — ABNORMAL HIGH (ref 0.1–1.0)
Monocytes Relative: 9 %
Neutro Abs: 10 K/uL — ABNORMAL HIGH (ref 1.7–7.7)
Neutrophils Relative %: 77 %
Platelets: 240 K/uL (ref 150–400)
RBC: 3.94 MIL/uL (ref 3.87–5.11)
RDW: 12.6 % (ref 11.5–15.5)
WBC: 13 K/uL — ABNORMAL HIGH (ref 4.0–10.5)
nRBC: 0 % (ref 0.0–0.2)

## 2024-04-23 LAB — BASIC METABOLIC PANEL WITH GFR
Anion gap: 11 (ref 5–15)
BUN: 17 mg/dL (ref 8–23)
CO2: 27 mmol/L (ref 22–32)
Calcium: 10.8 mg/dL — ABNORMAL HIGH (ref 8.9–10.3)
Chloride: 105 mmol/L (ref 98–111)
Creatinine, Ser: 0.84 mg/dL (ref 0.44–1.00)
GFR, Estimated: >60 mL/min (ref >60)
Glucose, Bld: 108 mg/dL — ABNORMAL HIGH (ref 70–99)
Potassium: 4.3 mmol/L (ref 3.5–5.1)
Sodium: 142 mmol/L (ref 135–145)

## 2024-04-23 MED ORDER — ONDANSETRON HCL 4 MG/2ML IJ SOLN
4.0000 mg | Freq: Once | INTRAMUSCULAR | Status: AC
  Administered 2024-04-23: 4 mg via INTRAVENOUS
  Filled 2024-04-23: qty 2

## 2024-04-23 MED ORDER — METHOCARBAMOL 500 MG PO TABS: 500.0000 mg | ORAL_TABLET | Freq: Two times a day (BID) | ORAL | 0 refills | Status: DC

## 2024-04-23 MED ORDER — DICLOFENAC SODIUM 1 % EX GEL: 4.0000 g | Freq: Four times a day (QID) | CUTANEOUS | 0 refills | Status: AC

## 2024-04-23 MED ORDER — MORPHINE SULFATE (PF) 4 MG/ML IV SOLN
4.0000 mg | Freq: Once | INTRAVENOUS | Status: AC
  Administered 2024-04-23: 4 mg via INTRAVENOUS
  Filled 2024-04-23: qty 1

## 2024-04-23 MED ORDER — IOHEXOL 300 MG/ML  SOLN
100.0000 mL | Freq: Once | INTRAMUSCULAR | Status: AC | PRN
  Administered 2024-04-23: 100 mL via INTRAVENOUS

## 2024-04-23 NOTE — ED Triage Notes (Signed)
 Arrives ambulatory to the ED with complaints of falling out of bed last night, hitting her bed. No head injuries or LOC. Rates her pain an 8/10.

## 2024-04-23 NOTE — Discharge Instructions (Signed)
 Call your doctor and let them know about your visit today.  Please return for worsening pain fever inability eat or drink.  Use the gel as prescribed. Also take tylenol 1000mg (2 extra strength) four times a day.

## 2024-04-23 NOTE — Telephone Encounter (Signed)
 Patient going to ED. Please review triage notes.

## 2024-04-23 NOTE — ED Notes (Signed)
 DC paperwork given and verbally understood.

## 2024-04-23 NOTE — ED Provider Notes (Signed)
 [place] EMERGENCY DEPARTMENT AT [facility] Provider Note   CSN: [id number] Arrival date & time: 04/23/24  1039     Patient presents with: Fall and Back Pain   Olivia Bender is a 73 y.o. female.   73 yo F with a chief complaint of a fall.  This happened yesterday.  She said she was playing with her dog and she fell out of bed.  She is not exactly sure what made her fall out of the bed.  Struck her left side of her chest wall against a dresser.  Since that has been having pain to the area.  Pain with inspiration.  Denies abdominal pain.  Pain goes down to the left side of the back.  She denies head injury denies loss consciousness denies neck pain.  Has been able to ambulate without issue   Fall  Back Pain      Prior to Admission medications   Medication Sig Start Date End Date Taking? Authorizing Provider  diclofenac  Sodium (VOLTAREN ) 1 % GEL Apply 4 g topically 4 (four) times daily. 04/23/24  Yes Emil Share, DO  methocarbamol  (ROBAXIN ) 500 MG tablet Take 1 tablet (500 mg total) by mouth 2 (two) times daily. 04/23/24  Yes Emil Share, DO  albuterol (VENTOLIN HFA) 108 (90 Base) MCG/ACT inhaler Inhale 2 puffs into the lungs every 4 (four) hours as needed. 02/13/23   [provider]  amLODipine  (NORVASC ) 2.5 MG tablet TAKE 1 TABLET BY MOUTH EVERY DAY 11/11/23   Katrinka Garnette KIDD, MD  aspirin 81 MG EC tablet Take 81 mg by mouth daily.    [provider]  Azelastine HCl 137 MCG/SPRAY SOLN Place into the nose. Patient not taking: Reported on 03/18/2024    [provider]  cyclobenzaprine  (FLEXERIL ) 5 MG tablet Take 1 tablet (5 mg total) by mouth 3 (three) times daily as needed for muscle spasms (dont drive for 6 hours after taking). 06/27/23   Katrinka Garnette KIDD, MD  Ferrous Sulfate (IRON PO) Take by mouth. 3 times a week as needed    [provider]  inclisiran (LEQVIO ) 284 MG/1.5ML SOSY injection Inject at 0, 3 months then q 6 months  thereafter 10/10/22   Walker, Caitlin S, NP  levocetirizine (XYZAL ALLERGY 24HR) 5 MG tablet take 1 tablet in the evening once a day orally Orally Once a day; Duration: 30 days 01/20/24   [provider]  levothyroxine  (SYNTHROID ) 75 MCG tablet TAKE 1 TABLET BY MOUTH EVERY DAY 09/24/23   Katrinka Garnette KIDD, MD  metoprolol  succinate (TOPROL -XL) 25 MG 24 hr tablet TAKE ONE TABLET DAILY WITH AN EXTRA HALF TABLET AS NEEDED FOR BREAKTHROUGH PALPITATIONS 07/14/23   Lonni Slain, MD  omeprazole (PRILOSEC OTC) 20 MG tablet Take 20 mg by mouth as needed.    [provider]  Polyethyl Glycol-Propyl Glycol (SYSTANE OP) Apply to eye.    [provider]  Olivia Bender 180 MCG/ACT inhaler Inhale into the lungs. Patient not taking: Reported on 03/18/2024 02/17/23   [provider]  sertraline  (ZOLOFT ) 50 MG tablet TAKE 1 TABLET BY MOUTH EVERY DAY 09/24/23   Katrinka Garnette KIDD, MD  triamcinolone  (NASACORT ) 55 MCG/ACT AERO nasal inhaler Place 2 sprays into the nose daily. Patient not taking: Reported on 03/18/2024    [provider]  VITAMIN D  PO Take by mouth.    [provider]    Allergies: Atorvastatin, Crestor  [rosuvastatin ], Molds & smuts, Pitavastatin, Tetanus toxoid-containing vaccines, Latex, Ezetimibe , and  Statins    Review of Systems  Musculoskeletal:  Positive for back pain.    Updated Vital Signs BP (!) 128/58 (BP Location: Right Arm)   Pulse 84   Temp 97.8 F (36.6 C) (Temporal)   Resp 18   Ht 5' (1.524 m)   Wt 79.4 kg   SpO2 97%   BMI 34.18 kg/m   Physical Exam Vitals and nursing note reviewed.  Constitutional:      General: She is not in acute distress.    Appearance: She is well-developed. She is not diaphoretic.  HENT:     Head: Normocephalic and atraumatic.  Eyes:     Pupils: Pupils are equal, round, and reactive to light.  Cardiovascular:     Rate and Rhythm: Normal rate and regular rhythm.     Heart sounds: No  murmur heard.    No friction rub. No gallop.  Pulmonary:     Effort: Pulmonary effort is normal.     Breath sounds: No wheezing or rales.  Abdominal:     General: There is no distension.     Palpations: Abdomen is soft.     Tenderness: There is no abdominal tenderness.  Musculoskeletal:        General: Tenderness present.     Cervical back: Normal range of motion and neck supple.     Comments: Tenderness about the posterior rib angles about ribs 6 through 12 on the left.  Has pain extending down to the left flank.  No obvious external signs of trauma.  No obvious midline spinal tenderness step-offs or deformities.  No C-spine tenderness able to rotate her head 45 degrees in either direction without discomfort.  Palpated from head to toe without any other obvious noted areas of bony tenderness.  Skin:    General: Skin is warm and dry.  Neurological:     Mental Status: She is alert and oriented to person, place, and time.  Psychiatric:        Behavior: Behavior normal.     (all labs ordered are listed, but only abnormal results are displayed) Labs Reviewed  CBC WITH DIFFERENTIAL/PLATELET - Abnormal; Notable for the following components:      Result Value   WBC 13.0 (*)    Neutro Abs 10.0 (*)    Monocytes Absolute 1.2 (*)    All other components within normal limits  BASIC METABOLIC PANEL WITH GFR - Abnormal; Notable for the following components:   Glucose, Bld 108 (*)    Calcium  10.8 (*)    All other components within normal limits    EKG: None  Radiology: CT CHEST ABDOMEN PELVIS W CONTRAST Result Date: 04/23/2024 EXAM: CT CHEST, ABDOMEN AND PELVIS WITH CONTRAST 04/23/2024 01:20:43 PM TECHNIQUE: CT of the chest, abdomen and pelvis was performed with the administration of 100 mL of iohexol  (OMNIPAQUE ) 300 MG/ML solution. Multiplanar reformatted images are provided for review. Automated exposure control, iterative reconstruction, and/or weight based adjustment of the mA/kV was  utilized to reduce the radiation dose to as low as reasonably achievable. COMPARISON: 11/28/2023 and 07/25/2020. CLINICAL HISTORY: Polytrauma, blunt. FINDINGS: CHEST: MEDIASTINUM AND LYMPH NODES: Heart and pericardium are unremarkable. The central airways are clear. No mediastinal, hilar or axillary lymphadenopathy. LUNGS AND PLEURA: 9 mm nodule is noted posteriorly in right lower lobe best seen on image number 30 of series 4 which can be considered benign at this point. No focal consolidation or pulmonary edema. No pleural effusion or pneumothorax. ABDOMEN AND PELVIS: LIVER: The  liver is unremarkable. GALLBLADDER AND BILE DUCTS: Minimal cholelithiasis. No biliary ductal dilatation. SPLEEN: No acute abnormality. PANCREAS: No acute abnormality. ADRENAL GLANDS: No acute abnormality. KIDNEYS, URETERS AND BLADDER: No stones in the kidneys or ureters. No hydronephrosis. No perinephric or periureteral stranding. Urinary bladder is unremarkable. GI AND BOWEL: Stomach demonstrates no acute abnormality. There is no bowel obstruction. REPRODUCTIVE ORGANS: No acute abnormality. PERITONEUM AND RETROPERITONEUM: No ascites. No free air. VASCULATURE: Aorta is normal in caliber. ABDOMINAL AND PELVIS LYMPH NODES: No lymphadenopathy. REPRODUCTIVE ORGANS: No acute abnormality. BONES AND SOFT TISSUES: No acute osseous abnormality. No focal soft tissue abnormality. IMPRESSION: 1. No acute abnormality of the chest, abdomen, and pelvis related to the polytrauma. 12 Electronically signed by: Lynwood Seip MD 04/23/2024 01:49 PM EST RP Workstation: [id number]   DG Chest Port 1 View Result Date: 04/23/2024 EXAM: 1 VIEW(S) XRAY OF THE CHEST 04/23/2024 11:59:27 AM COMPARISON: 06/09/2019 CLINICAL HISTORY: rib pain? FINDINGS: LUNGS AND PLEURA: Low lung volumes. No focal pulmonary opacity. No pleural effusion. No pneumothorax. HEART AND MEDIASTINUM: Tortuous aorta. No acute abnormality of the cardiac and mediastinal silhouettes. BONES AND SOFT  TISSUES: Thoracic spondylosis. No acute osseous abnormality. IMPRESSION: 1. No acute cardiopulmonary process identified. 2. Low lung volumes. Electronically signed by: Waddell Calk MD 04/23/2024 12:27 PM EST RP Workstation: [id number]     Procedures   Medications Ordered in the ED  morphine  (PF) 4 MG/ML injection 4 mg (4 mg Intravenous Given 04/23/24 1129)  ondansetron  (ZOFRAN ) injection 4 mg (4 mg Intravenous Given 04/23/24 1128)  iohexol  (OMNIPAQUE ) 300 MG/ML solution 100 mL (100 mLs Intravenous Contrast Given 04/23/24 1314)                                    Medical Decision Making Amount and/or Complexity of Data Reviewed Labs: ordered. Radiology: ordered.  Risk Prescription drug management.   73 yo F with a chief complaint of a fall.  Nonsyncopal by history.  Complaining of left flank pain.  Patient with significant rib tenderness.  Concern for multiple rib fractures will obtain CT imaging.  Plain film of the chest independently interpreted by me without obvious displaced rib fracture or pneumothorax.  Mild leukocytosis.  No significant electrolyte abnormality.  CT imaging of the chest abdomen pelvis without obvious rib fracture or intrathoracic or intra-abdominal pathology.  2:30 PM:  I have discussed the diagnosis/risks/treatment options with the patient.  Evaluation and diagnostic testing in the emergency department does not suggest an emergent condition requiring admission or immediate intervention beyond what has been performed at this time.  They will follow up with PCP. We also discussed returning to the ED immediately if new or worsening sx occur. We discussed the sx which are most concerning (e.g., sudden worsening pain, fever, inability to tolerate by mouth) that necessitate immediate return. Medications administered to the patient during their visit and any new prescriptions provided to the patient are listed below.  Medications given during this visit Medications   morphine  (PF) 4 MG/ML injection 4 mg (4 mg Intravenous Given 04/23/24 1129)  ondansetron  (ZOFRAN ) injection 4 mg (4 mg Intravenous Given 04/23/24 1128)  iohexol  (OMNIPAQUE ) 300 MG/ML solution 100 mL (100 mLs Intravenous Contrast Given 04/23/24 1314)     The patient appears reasonably screen and/or stabilized for discharge and I doubt any other medical condition or other [facility] requiring further screening, evaluation, or treatment in the ED at this time prior to  discharge.       Final diagnoses:  Left flank pain    ED Discharge Orders          Ordered    methocarbamol  (ROBAXIN ) 500 MG tablet  2 times daily        04/23/24 1418    diclofenac  Sodium (VOLTAREN ) 1 % GEL  4 times daily        04/23/24 1418               Emil Share, DO 04/23/24 1430

## 2024-04-23 NOTE — Telephone Encounter (Signed)
 FYI Only or Action Required?: FYI only for provider: ED advised.  Patient was last seen in primary care on 12/25/2023 by Katrinka Garnette KIDD, MD.  Called Nurse Triage reporting Back Pain.  Symptoms began yesterday.  Interventions attempted: OTC medications: Tylenol, Prescription medications: muscle relaxer, and Rest, hydration, or home remedies.  Symptoms are: gradually worsening.  Triage Disposition: Go to ED Now (Notify PCP)  Patient/caregiver understands and will follow disposition?: Yes  Copied from CRM #[id number]. Topic: Clinical - Red Word Triage >> Apr 23, 2024  9:11 AM Amy B wrote: Red Word that prompted transfer to Nurse Triage: Patient fell last night.  Unable to move due to back pain, unable to control bladder Reason for Disposition  [1] SEVERE back pain (e.g., excruciating) AND [2] sudden onset AND [3] age > 60 years  Answer Assessment - Initial Assessment Questions Reports sliding out of bed and hitting her nightstand with her back. Patient states prior to fall during the day, she started having a couple of episodes of urinary incontinence. Patient states the pain is currently 8 out of 10. Did recommend patient to the ED via either family member or calling 911. Patient verbalized understanding and states she would go.   1. ONSET: When did the pain begin? (e.g., minutes, hours, days)     Started early this morning after falling around midnight 2. LOCATION: Where does it hurt? (upper, mid or lower back)     Mid back 3. Severity: How bad is the pain?  (e.g., Scale 1-10; mild, moderate, or severe)     8 out of 10 4. PATTERN: Is the pain constant? (e.g., yes, no; constant, intermittent)      Intermittent-pain is present with movement.  5. RADIATION: Does the pain shoot into your legs or somewhere else?     No radiation 6. CAUSE:  What do you think is causing the back pain?      Patient states she slid out of bed and hit her nightstand with her back.  7. BACK  OVERUSE:  Any recent lifting of heavy objects, strenuous work or exercise?     no 8. MEDICINES: What have you taken so far for the pain? (e.g., nothing, acetaminophen, NSAIDS)     Muscle relaxer, tylenol 9. NEUROLOGIC SYMPTOMS: Do you have any weakness, numbness, or problems with bowel/bladder control?     Bladder control issues but started yesterday prior to the fall.  10. OTHER SYMPTOMS: Do you have any other symptoms? (e.g., fever, abdomen pain, burning with urination, blood in urine)       States urine is cloudy.  Protocols used: Back Pain-A-AH

## 2024-04-27 ENCOUNTER — Telehealth: Payer: Self-pay

## 2024-04-27 NOTE — Telephone Encounter (Signed)
 Transition Care Management Follow-up Telephone Call Date of discharge and from where: 04/23/24 [facility]  How have you been since you were released from the hospital? Better Any questions or concerns? Yes; pt having urinary symptoms again and unable to control bladder  Items Reviewed: Did the pt receive and understand the discharge instructions provided? Yes  Medications obtained and verified? Yes  Other? No  Any new allergies since your discharge? No  Dietary orders reviewed? No Do you have support at home? Yes   Home Care and Equipment/Supplies: Were home health services ordered? not applicable If so, what is the name of the agency?   Has the agency set up a time to come to the patient's home? not applicable Were any new equipment or medical supplies ordered?  No What is the name of the medical supply agency? N/A Were you able to get the supplies/equipment? not applicable Do you have any questions related to the use of the equipment or supplies? No  Functional Questionnaire: (I = Independent and D = Dependent) ADLs: I  Bathing/Dressing- I  Meal Prep- I  Eating- I  Maintaining continence- I  Transferring/Ambulation- I  Managing Meds- I  Follow up appointments reviewed:  PCP Hospital f/u appt confirmed? Yes  Scheduled to see Lucie Buttner, PA  on 05/03/24 @ 11am. Specialist [facility] f/u appt confirmed? No   Are transportation arrangements needed? No  If their condition worsens, is the pt aware to call PCP or go to the Emergency Dept.? Yes Was the patient provided with contact information for the PCP's office or ED? Yes Was to pt encouraged to call back with questions or concerns? Yes

## 2024-04-28 ENCOUNTER — Ambulatory Visit
Admission: EM | Admit: 2024-04-28 | Discharge: 2024-04-28 | Disposition: A | Discharge status: Home / Self Care | Attending: Internal Medicine | Admitting: Internal Medicine

## 2024-04-28 ENCOUNTER — Encounter: Payer: Self-pay | Admitting: Emergency Medicine

## 2024-04-28 ENCOUNTER — Telehealth: Payer: Self-pay | Admitting: Physician Assistant

## 2024-04-28 DIAGNOSIS — R3915 Urgency of urination: Secondary | ICD-10-CM | POA: Diagnosis not present

## 2024-04-28 DIAGNOSIS — R32 Unspecified urinary incontinence: Secondary | ICD-10-CM

## 2024-04-28 DIAGNOSIS — R350 Frequency of micturition: Secondary | ICD-10-CM | POA: Diagnosis not present

## 2024-04-28 DIAGNOSIS — N9489 Other specified conditions associated with female genital organs and menstrual cycle: Secondary | ICD-10-CM

## 2024-04-28 LAB — POCT URINE DIPSTICK
Bilirubin, UA: NEGATIVE
Glucose, UA: NEGATIVE mg/dL
Ketones, POC UA: NEGATIVE mg/dL
Nitrite, UA: NEGATIVE
POC PROTEIN,UA: 30 — AB
Spec Grav, UA: 1.01 (ref 1.010–1.025)
Urobilinogen, UA: 0.2 U/dL
pH, UA: 6 (ref 5.0–8.0)

## 2024-04-28 MED ORDER — SULFAMETHOXAZOLE-TRIMETHOPRIM 800-160 MG PO TABS: 1.0000 | ORAL_TABLET | Freq: Two times a day (BID) | ORAL | 0 refills | Status: DC

## 2024-04-28 NOTE — Telephone Encounter (Signed)
 Patient is on my schedule for 12/1 for acute visit for urinary symptom(s).  I reviewed chart. She was just seen in ER 11/21 for fall from unknown etiology. Has elevated WBC. With the symptom(s) she is scheduled to be seen for on 12/1 as well as recent events/labs, I do feel she should wait until 12/1 to be evaluated for this.  I reviewed with PCP, Dr Katrinka, who is in agreement.   Please call patient and see if she can come into our office today before 4 for me to see her. Our offices reportedly do not have any additional openings.  Otherwise, I recommend urgent care.  Ailany Koren

## 2024-04-28 NOTE — Telephone Encounter (Signed)
 Left message on voicemail to call office. Pt needs to be seen today add to Samantha's schedule per [facility].

## 2024-04-28 NOTE — Discharge Instructions (Addendum)
 Urinalysis done today shows blood, protein and Susanne Baumgarner blood cells.  Combined with the current symptoms this is concerning for urinary tract infection.  We will treat this with an antibiotic by mouth.  We recommend the following: Sulfamethoxazole -trimethoprim  (Bactrim  DS) 800/160 mg twice daily for 5 days.  This is an antibiotic.  Take this with food.  Make sure to stay hydrated by drinking plenty of water. If you develop fevers, severe abdominal pain or severe back pain then recommend going to the emergency room for further evaluation Keep appointment as scheduled with your primary care provider on Monday and can discuss possible need for urology evaluation given recurrent urinary symptoms Can follow-up at urgent care as needed

## 2024-04-28 NOTE — ED Triage Notes (Addendum)
 Pt was seen in ED on 11/21 for a fall. Her bloodwork showed elevated WBC and PCP recommended she be tested for UTI at Urgent Care. Pt states she has been having episodes of incontinence and vaginal burning for about 2 weeks.

## 2024-04-28 NOTE — ED Provider Notes (Signed)
 [facility]    CSN: [id number] Arrival date & time: 04/28/24  1737      History   Chief Complaint Chief Complaint  Patient presents with   Urinary Frequency    HPI Olivia Bender is Bender 73 y.o. female.   73 year old female who presents urgent care with complaints of urinary frequency, urinary urgency, urinary incontinence, dysuria and vaginal burning.  She relates that she has been having some trouble with having to urgently go to the bathroom and not being able to make it.  This is associated with urinary frequency and some dysuria.  She relates that she has had some vaginal burning as well.  She was seen in the emergency room on November 21 secondary to Bender fall off of her bed.  She suffered Bender bruised area to her left flank.  At the hospital she was told that she had Bender slightly elevated Olivia Bender blood cell count and that she needed to monitor this and if she developed fevers or symptoms of an infection that she should seek evaluation.  The patient noted that since then she has had worsening urinary symptoms.  She has had Bender history of some urinary incontinence in the past that improved when she modified the soaps that she was using.  She did try to contact her primary care provider who has scheduled her for an appointment on Monday but they recommended with her symptoms and the recent lab work that she seek urgent care evaluation prior to this.  She denies any fevers or chills.  She denies any nausea or vomiting although she does relate she has not had much of an appetite all week.   Urinary Frequency Pertinent negatives include no chest pain, no abdominal pain and no shortness of breath.    Past Medical History:  Diagnosis Date   Allergy 1985   Anxiety    Arthritis    Asthma    Constipation    Depression 2007   Enlarged liver    Generalized headaches    since teenage years- usually can get away with tylenol   GERD (gastroesophageal reflux disease)    Hyperlipidemia     Hypertension July 2022   Osteopenia    Thyroid  disease    hypothyroidism    Patient Active Problem List   Diagnosis Date Noted   Paroxysmal SVT (supraventricular tachycardia) 06/27/2023   Chronic allergic conjunctivitis 11/05/2021   Mild intermittent asthma 11/05/2021   Pulmonary nodule/lesion, solitary 02/21/2020   Essential hypertension 12/10/2019   Aortic atherosclerosis 12/11/2018   Major depression in full remission 05/18/2018   Asthma 11/21/2017   Generalized headaches    Trochanteric bursitis of left hip 12/11/2015   Fatty infiltration of liver 12/30/2014   Statin intolerance 06/30/2014   Left knee pain 06/30/2014   Prediabetes 06/30/2014   Hypothyroidism 12/01/2010   Hyperlipidemia 12/01/2010   Osteopenia 12/01/2010   Allergic rhinitis 12/01/2010   GE reflux 12/01/2010    Past Surgical History:  Procedure Laterality Date   arthroscopic left knee surgery     DILATION AND CURETTAGE OF UTERUS     monthlong period related. everything was ok   NASAL SINUS SURGERY  1986    OB History   No obstetric history on file.      Home Medications    Prior to Admission medications   Medication Sig Start Date End Date Taking? Authorizing Provider  sulfamethoxazole -trimethoprim  (BACTRIM  DS) 800-160 MG tablet Take 1 tablet by mouth 2 (two) times daily for  5 days. 04/28/24 05/03/24 Yes Olivia Wombles A, PA-C  albuterol (VENTOLIN HFA) 108 (90 Base) MCG/ACT inhaler Inhale 2 puffs into the lungs every 4 (four) hours as needed. 02/13/23   [provider]  amLODipine  (NORVASC ) 2.5 MG tablet TAKE 1 TABLET BY MOUTH EVERY DAY 11/11/23   Katrinka Garnette KIDD, MD  aspirin 81 MG EC tablet Take 81 mg by mouth daily.    [provider]  Azelastine HCl 137 MCG/SPRAY SOLN Place into the nose. Patient not taking: Reported on 03/18/2024    [provider]  cyclobenzaprine  (FLEXERIL ) 5 MG tablet Take 1 tablet (5 mg total) by mouth 3 (three) times daily as needed for  muscle spasms (dont drive for 6 hours after taking). 06/27/23   Katrinka Garnette KIDD, MD  diclofenac  Sodium (VOLTAREN ) 1 % GEL Apply 4 g topically 4 (four) times daily. 04/23/24   Floyd, Dan, DO  Ferrous Sulfate (IRON PO) Take by mouth. 3 times Bender week as needed    [provider]  inclisiran (LEQVIO ) 284 MG/1.5ML SOSY injection Inject at 0, 3 months then q 6 months thereafter 10/10/22   Walker, Caitlin S, NP  levocetirizine (XYZAL ALLERGY 24HR) 5 MG tablet take 1 tablet in the evening once Bender day orally Orally Once Bender day; Duration: 30 days 01/20/24   [provider]  levothyroxine  (SYNTHROID ) 75 MCG tablet TAKE 1 TABLET BY MOUTH EVERY DAY 09/24/23   Katrinka Garnette KIDD, MD  methocarbamol  (ROBAXIN ) 500 MG tablet Take 1 tablet (500 mg total) by mouth 2 (two) times daily. 04/23/24   Floyd, Dan, DO  metoprolol  succinate (TOPROL -XL) 25 MG 24 hr tablet TAKE ONE TABLET DAILY WITH AN EXTRA HALF TABLET AS NEEDED FOR BREAKTHROUGH PALPITATIONS 07/14/23   Lonni Slain, MD  omeprazole (PRILOSEC OTC) 20 MG tablet Take 20 mg by mouth as needed.    [provider]  Polyethyl Glycol-Propyl Glycol (SYSTANE OP) Apply to eye.    [provider]  [name] 180 MCG/ACT inhaler Inhale into the lungs. Patient not taking: Reported on 04/27/2024 02/17/23   [provider]  sertraline  (ZOLOFT ) 50 MG tablet TAKE 1 TABLET BY MOUTH EVERY DAY 09/24/23   Katrinka Garnette KIDD, MD  triamcinolone  (NASACORT ) 55 MCG/ACT AERO nasal inhaler Place 2 sprays into the nose daily. Patient not taking: Reported on 04/27/2024    [provider]  VITAMIN D  PO Take by mouth.    [provider]    Family History Family History  Problem Relation Age of Onset   Stroke Mother        34. never smoker   Hypertension Mother    Arthritis Mother    Hearing loss Mother    Kidney disease Mother    Miscarriages / Stillbirths Mother    Obesity Mother    Other Father        killed in  car wreck 2012   Prostate cancer Father    Cancer Father    Obesity Sister    Kidney cancer Son    Heart disease Maternal Grandmother    Hypertension Maternal Grandmother    Obesity Maternal Grandmother    Heart disease Maternal Grandfather    Hearing loss Maternal Grandfather    Heart disease Maternal Aunt    Obesity Maternal Aunt    Arthritis Sister    Obesity Sister    ADD / ADHD Son     Social History Social History   Tobacco Use   Smoking status: Never  Smokeless tobacco: Never  Substance Use Topics   Alcohol use: No   Drug use: Never     Allergies   Atorvastatin, Crestor  [rosuvastatin ], Molds & smuts, Pitavastatin, Tetanus toxoid-containing vaccines, Latex, Ezetimibe , and Statins   Review of Systems Review of Systems  Constitutional:  Negative for chills and fever.  HENT:  Negative for ear pain and sore throat.   Eyes:  Negative for pain and visual disturbance.  Respiratory:  Negative for cough and shortness of breath.   Cardiovascular:  Negative for chest pain and palpitations.  Gastrointestinal:  Negative for abdominal pain and vomiting.  Genitourinary:  Positive for dysuria, frequency and urgency. Negative for hematuria.       Vaginal burning and urinary incontinence  Musculoskeletal:  Negative for arthralgias and back pain.  Skin:  Negative for color change and rash.  Neurological:  Negative for seizures and syncope.  All other systems reviewed and are negative.    Physical Exam Triage Vital Signs ED Triage Vitals [04/28/24 1746]  Encounter Vitals Group     BP (!) 153/84     Girls Systolic BP Percentile      Girls Diastolic BP Percentile      Boys Systolic BP Percentile      Boys Diastolic BP Percentile      Pulse Rate 76     Resp 16     Temp 98.4 F (36.9 C)     Temp Source Oral     SpO2 95 %     Weight      Height      Head Circumference      Peak Flow      Pain Score      Pain Loc      Pain Education      Exclude from Growth Chart     No data found.  Updated Vital Signs BP (!) 153/84 (BP Location: Right Arm)   Pulse 76   Temp 98.4 F (36.9 C) (Oral)   Resp 16   SpO2 95%   Visual Acuity Right Eye Distance:   Left Eye Distance:   Bilateral Distance:    Right Eye Near:   Left Eye Near:    Bilateral Near:     Physical Exam Vitals and nursing note reviewed.  Constitutional:      General: She is not in acute distress.    Appearance: She is well-developed.  HENT:     Head: Normocephalic and atraumatic.  Eyes:     Conjunctiva/sclera: Conjunctivae normal.  Cardiovascular:     Rate and Rhythm: Normal rate and regular rhythm.     Heart sounds: No murmur heard. Pulmonary:     Effort: Pulmonary effort is normal. No respiratory distress.     Breath sounds: Normal breath sounds.  Abdominal:     General: Bowel sounds are normal.     Palpations: Abdomen is soft.     Tenderness: There is no abdominal tenderness. There is no right CVA tenderness, left CVA tenderness, guarding or rebound.  Musculoskeletal:        General: No swelling.     Cervical back: Neck supple.  Skin:    General: Skin is warm and dry.     Capillary Refill: Capillary refill takes less than 2 seconds.  Neurological:     Mental Status: She is alert.  Psychiatric:        Mood and Affect: Mood normal.      UC Treatments / Results  Labs (all labs  ordered are listed, but only abnormal results are displayed) Labs Reviewed  POCT URINE DIPSTICK - Abnormal; Notable for the following components:      Result Value   Clarity, UA cloudy (*)    Blood, UA moderate (*)    POC PROTEIN,UA =30 (*)    Leukocytes, UA Moderate (2+) (*)    All other components within normal limits    EKG   Radiology No results found.  Procedures Procedures (including critical care time)  Medications Ordered in UC Medications - No data to display  Initial Impression / Assessment and Plan / UC Course  I have reviewed the triage vital signs and the nursing  notes.  Pertinent labs & imaging results that were available during my care of the patient were reviewed by me and considered in my medical decision making (see chart for details).     Urinary frequency  Urinary urgency  Vaginal burning  Urinary incontinence, unspecified type   Urinalysis done today shows blood, protein and Hart Haas blood cells.  Combined with the current symptoms this is concerning for urinary tract infection.  We will treat this with an antibiotic by mouth.  We recommend the following: Sulfamethoxazole -trimethoprim  (Bactrim  DS) 800/160 mg twice daily for 5 days.  This is an antibiotic.  Take this with food.  Make sure to stay hydrated by drinking plenty of water. If you develop fevers, severe abdominal pain or severe back pain then recommend going to the emergency room for further evaluation Keep appointment as scheduled with your primary care provider on Monday and can discuss possible need for urology evaluation given recurrent urinary symptoms Can follow-up at urgent care as needed  Final Clinical Impressions(s) / UC Diagnoses   Final diagnoses:  Urinary frequency  Urinary urgency  Vaginal burning  Urinary incontinence, unspecified type     Discharge Instructions      Urinalysis done today shows blood, protein and Keaghan Bowens blood cells.  Combined with the current symptoms this is concerning for urinary tract infection.  We will treat this with an antibiotic by mouth.  We recommend the following: Sulfamethoxazole -trimethoprim  (Bactrim  DS) 800/160 mg twice daily for 5 days.  This is an antibiotic.  Take this with food.  Make sure to stay hydrated by drinking plenty of water. If you develop fevers, severe abdominal pain or severe back pain then recommend going to the emergency room for further evaluation Keep appointment as scheduled with your primary care provider on Monday and can discuss possible need for urology evaluation given recurrent urinary symptoms Can  follow-up at urgent care as needed    ED Prescriptions     Medication Sig Dispense Auth. Provider   sulfamethoxazole -trimethoprim  (BACTRIM  DS) 800-160 MG tablet Take 1 tablet by mouth 2 (two) times daily for 5 days. 10 tablet Teresa Almarie LABOR, [place]      PDMP not reviewed this encounter.   Teresa Almarie LABOR, [place] 04/28/24 1834

## 2024-04-28 NOTE — Telephone Encounter (Signed)
 Spoke to pt told her we would like her to be evaluated today, unfortunately we do not have any openings anymore today. Please go to an Urgent Care to be evaluated per [facility] due to elevated white count to rule out urinary tract infection. We do not want you to wait till Monday to be evaluated. Pt verbalized understanding and said she will go. She asked if she should keep her appt for Monday with Samantha. Told her that is fine per [facility].

## 2024-05-03 ENCOUNTER — Other Ambulatory Visit (HOSPITAL_COMMUNITY)
Admission: RE | Admit: 2024-05-03 | Discharge: 2024-05-03 | Disposition: A | Source: Ambulatory Visit | Attending: Physician Assistant | Admitting: Physician Assistant

## 2024-05-03 ENCOUNTER — Ambulatory Visit: Admitting: Physician Assistant

## 2024-05-03 ENCOUNTER — Encounter: Payer: Self-pay | Admitting: Physician Assistant

## 2024-05-03 VITALS — BP 132/84 | HR 68 | Temp 97.3°F | Ht 60.0 in | Wt 177.0 lb

## 2024-05-03 DIAGNOSIS — R102 Pelvic and perineal pain unspecified side: Secondary | ICD-10-CM | POA: Diagnosis not present

## 2024-05-03 DIAGNOSIS — N3001 Acute cystitis with hematuria: Secondary | ICD-10-CM

## 2024-05-03 LAB — CBC WITH DIFFERENTIAL/PLATELET
Basophils Absolute: 0 K/uL (ref 0.0–0.1)
Basophils Relative: 0.4 % (ref 0.0–3.0)
Eosinophils Absolute: 0.2 K/uL (ref 0.0–0.7)
Eosinophils Relative: 2.2 % (ref 0.0–5.0)
HCT: 34.2 % — ABNORMAL LOW (ref 36.0–46.0)
Hemoglobin: 11.5 g/dL — ABNORMAL LOW (ref 12.0–15.0)
Lymphocytes Relative: 24.8 % (ref 12.0–46.0)
Lymphs Abs: 1.8 K/uL (ref 0.7–4.0)
MCHC: 33.6 g/dL (ref 30.0–36.0)
MCV: 93.2 fl (ref 78.0–100.0)
Monocytes Absolute: 0.7 K/uL (ref 0.1–1.0)
Monocytes Relative: 9.6 % (ref 3.0–12.0)
Neutro Abs: 4.4 K/uL (ref 1.4–7.7)
Neutrophils Relative %: 63 % (ref 43.0–77.0)
Platelets: 331 K/uL (ref 150.0–400.0)
RBC: 3.67 Mil/uL — ABNORMAL LOW (ref 3.87–5.11)
RDW: 13.4 % (ref 11.5–15.5)
WBC: 7.1 K/uL (ref 4.0–10.5)

## 2024-05-03 LAB — COMPREHENSIVE METABOLIC PANEL WITH GFR
ALT: 16 U/L (ref 0–35)
AST: 27 U/L (ref 0–37)
Albumin: 4.2 g/dL (ref 3.5–5.2)
Alkaline Phosphatase: 63 U/L (ref 39–117)
BUN: 19 mg/dL (ref 6–23)
CO2: 25 meq/L (ref 19–32)
Calcium: 9.7 mg/dL (ref 8.4–10.5)
Chloride: 103 meq/L (ref 96–112)
Creatinine, Ser: 0.91 mg/dL (ref 0.40–1.20)
GFR: 62.37 mL/min (ref >60.00)
Glucose, Bld: 81 mg/dL (ref 70–99)
Potassium: 4.1 meq/L (ref 3.5–5.1)
Sodium: 136 meq/L (ref 135–145)
Total Bilirubin: 0.3 mg/dL (ref 0.2–1.2)
Total Protein: 7.5 g/dL (ref 6.0–8.3)

## 2024-05-03 LAB — URINALYSIS, ROUTINE W REFLEX MICROSCOPIC
Bilirubin Urine: NEGATIVE
Hgb urine dipstick: NEGATIVE
Ketones, ur: NEGATIVE
Leukocytes,Ua: NEGATIVE
Nitrite: NEGATIVE
RBC / HPF: NONE SEEN (ref 0–?)
Specific Gravity, Urine: <=1.005 — AB (ref 1.000–1.030)
Total Protein, Urine: NEGATIVE
Urine Glucose: NEGATIVE
Urobilinogen, UA: 0.2 (ref 0.0–1.0)
pH: 6 (ref 5.0–8.0)

## 2024-05-03 NOTE — Patient Instructions (Signed)
 It was great to see you!  We will recheck blood work and urine today We will also be in touch with your vaginal swab and provide recommendations when they return  Reach out if any new concerns  Take care,  Jerauld Bostwick PA-C

## 2024-05-03 NOTE — Progress Notes (Signed)
 History of Present Illness:   Chief Complaint  Patient presents with   Urinary Frequency    Pt is having urinary frequency and also incontinence     Discussed the use of AI scribe software for clinical note transcription with the patient, who gave verbal consent to proceed.  History of Present Illness   Olivia Bender is a 73 year old female who presents with symptoms of a urinary tract infection and back pain after a fall.  She went to urgent care on 04/28/24 for urinary frequency/vaginal burning/incontinence and completed a 5-day course of Bactrim  from urgent care for presumed UTI with partial improvement, but still has pressure with urination. She has had some improving episodes of urinary urgency with incontinence. Reports there is no weakness in legs or saddle anesthesia.  She fell out of bed while playing with her puppy and developed back pain and a bruise. She was evaluated in the emergency room with blood work.  She had frequent bladder infections when she was younger, especially after drinks with citric acid, but has not had one in years. She recently had a large bowel movement after a week of constipation, with no current diarrhea.  She has vaginal burning that improves at night with Lungastep but returns in the morning. There is no itching, bleeding, or discharge, but she sometimes feels raw. She has worn pads for urinary symptoms for the past two weeks. She has not had a pelvic exam since age 73.        Past Medical History:  Diagnosis Date   Allergy 1985   Anxiety    Arthritis    Asthma    Constipation    Depression 2007   Enlarged liver    Generalized headaches    since teenage years- usually can get away with tylenol   GERD (gastroesophageal reflux disease)    Hyperlipidemia    Hypertension July 2022   Osteopenia    Thyroid  disease    hypothyroidism     Social History   Tobacco Use   Smoking status: Never   Smokeless tobacco: Never  Substance Use  Topics   Alcohol use: No   Drug use: Never    Past Surgical History:  Procedure Laterality Date   arthroscopic left knee surgery     DILATION AND CURETTAGE OF UTERUS     monthlong period related. everything was ok   NASAL SINUS SURGERY  1986    Family History  Problem Relation Age of Onset   Stroke Mother        58. never smoker   Hypertension Mother    Arthritis Mother    Hearing loss Mother    Kidney disease Mother    Miscarriages / Stillbirths Mother    Obesity Mother    Other Father        killed in car wreck 2012   Prostate cancer Father    Cancer Father    Obesity Sister    Kidney cancer Son    Heart disease Maternal Grandmother    Hypertension Maternal Grandmother    Obesity Maternal Grandmother    Heart disease Maternal Grandfather    Hearing loss Maternal Grandfather    Heart disease Maternal Aunt    Obesity Maternal Aunt    Arthritis Sister    Obesity Sister    ADD / ADHD Son     Allergies  Allergen Reactions   Atorvastatin     Other reaction(s): myalgias (muscle pain)   Crestor  [  Rosuvastatin ]     Other reaction(s): muscle cramps   Molds & Smuts     Other reaction(s): respiratory distress   Pitavastatin     Other reaction(s): muscle cramps   Tetanus Toxoid-Containing Vaccines Anaphylaxis   Latex Rash   Ezetimibe  Other (See Comments)    Myalgia   Statins Other (See Comments)    Lipitor, Crestor , Livalo -myalgias    Current Medications:   Current Outpatient Medications:    albuterol (VENTOLIN HFA) 108 (90 Base) MCG/ACT inhaler, Inhale 2 puffs into the lungs every 4 (four) hours as needed., Disp: , Rfl:    amLODipine  (NORVASC ) 2.5 MG tablet, TAKE 1 TABLET BY MOUTH EVERY DAY, Disp: 90 tablet, Rfl: 3   aspirin 81 MG EC tablet, Take 81 mg by mouth daily., Disp: , Rfl:    Azelastine HCl 137 MCG/SPRAY SOLN, Place into the nose., Disp: , Rfl:    diclofenac  Sodium (VOLTAREN ) 1 % GEL, Apply 4 g topically 4 (four) times daily., Disp: 100 g, Rfl: 0    inclisiran (LEQVIO ) 284 MG/1.5ML SOSY injection, Inject at 0, 3 months then q 6 months thereafter, Disp: 1.5 mL, Rfl: 0   levocetirizine (XYZAL ALLERGY 24HR) 5 MG tablet, take 1 tablet in the evening once a day orally Orally Once a day; Duration: 30 days, Disp: , Rfl:    levothyroxine  (SYNTHROID ) 75 MCG tablet, TAKE 1 TABLET BY MOUTH EVERY DAY, Disp: 90 tablet, Rfl: 3   metoprolol  succinate (TOPROL -XL) 25 MG 24 hr tablet, TAKE ONE TABLET DAILY WITH AN EXTRA HALF TABLET AS NEEDED FOR BREAKTHROUGH PALPITATIONS, Disp: 135 tablet, Rfl: 2   omeprazole (PRILOSEC OTC) 20 MG tablet, Take 20 mg by mouth as needed., Disp: , Rfl:    Polyethyl Glycol-Propyl Glycol (SYSTANE OP), Apply to eye., Disp: , Rfl:    PULMICORT FLEXHALER 180 MCG/ACT inhaler, Inhale into the lungs., Disp: , Rfl:    sertraline  (ZOLOFT ) 50 MG tablet, TAKE 1 TABLET BY MOUTH EVERY DAY, Disp: 90 tablet, Rfl: 3   triamcinolone  (NASACORT ) 55 MCG/ACT AERO nasal inhaler, Place 2 sprays into the nose daily., Disp: , Rfl:    VITAMIN D  PO, Take by mouth., Disp: , Rfl:    Review of Systems:   Negative unless otherwise specified per HPI.  Vitals:   Vitals:   05/03/24 1052  BP: 132/84  Pulse: 68  Temp: (!) 97.3 F (36.3 C)  TempSrc: Temporal  SpO2: 98%  Weight: 177 lb (80.3 kg)  Height: 5' (1.524 m)     Body mass index is 34.57 kg/m.  Physical Exam:   Physical Exam Vitals and nursing note reviewed. Exam conducted with a chaperone present.  Constitutional:      General: She is not in acute distress.    Appearance: She is well-developed. She is not ill-appearing or toxic-appearing.  Cardiovascular:     Rate and Rhythm: Normal rate and regular rhythm.     Pulses: Normal pulses.     Heart sounds: Normal heart sounds, S1 normal and S2 normal.  Pulmonary:     Effort: Pulmonary effort is normal.     Breath sounds: Normal breath sounds.  Genitourinary:    Labia:        Right: No rash or tenderness.        Left: No rash or  tenderness.      Vagina: Normal.     Cervix: Normal.     Uterus: Normal.      Adnexa: Right adnexa normal and left adnexa  normal.  Skin:    General: Skin is warm and dry.  Neurological:     Mental Status: She is alert.     GCS: GCS eye subscore is 4. GCS verbal subscore is 5. GCS motor subscore is 6.  Psychiatric:        Speech: Speech normal.        Behavior: Behavior normal. Behavior is cooperative.     Assessment and Plan:   Assessment and Plan    Acute cystitis with hematuria  Urinary tract infection symptoms improving post-sulfamethoxazole . No urine culture performed. - Rechecked urine analysis. - Repeated blood work for WBC count. - If further symptom(s) of incontinence -- recommend referral to urology vs urogynecology vs pelvic floor physical therapy   Pelvic pressure in female  Intermittent pain without bleeding or discharge. Possible relation to urinary symptoms or gynecological issues. - Performed pelvic exam, no obvious abnormalities -- vaginal swab obtained to rule out yeast/BV  Lucie Buttner, PA-C

## 2024-05-04 ENCOUNTER — Ambulatory Visit: Payer: Self-pay | Admitting: Physician Assistant

## 2024-05-04 LAB — CERVICOVAGINAL ANCILLARY ONLY
Bacterial Vaginitis (gardnerella): NEGATIVE
Candida Glabrata: NEGATIVE
Candida Vaginitis: NEGATIVE
Comment: NEGATIVE
Comment: NEGATIVE
Comment: NEGATIVE

## 2024-05-04 LAB — URINE CULTURE
MICRO NUMBER:: 17295550
Result:: NO GROWTH
SPECIMEN QUALITY:: ADEQUATE

## 2024-05-05 ENCOUNTER — Ambulatory Visit: Admitting: Physician Assistant

## 2024-05-05 VITALS — BP 138/78 | HR 80 | Temp 98.0°F | Ht 60.0 in | Wt 177.6 lb

## 2024-05-05 DIAGNOSIS — R051 Acute cough: Secondary | ICD-10-CM | POA: Diagnosis not present

## 2024-05-05 DIAGNOSIS — J029 Acute pharyngitis, unspecified: Secondary | ICD-10-CM

## 2024-05-05 DIAGNOSIS — J301 Allergic rhinitis due to pollen: Secondary | ICD-10-CM

## 2024-05-05 LAB — POCT INFLUENZA A/B
Influenza A, POC: NEGATIVE
Influenza B, POC: NEGATIVE

## 2024-05-05 LAB — POC COVID19 BINAXNOW: SARS Coronavirus 2 Ag: NEGATIVE

## 2024-05-05 MED ORDER — AMOXICILLIN-POT CLAVULANATE 875-125 MG PO TABS: 1.0000 | ORAL_TABLET | Freq: Two times a day (BID) | ORAL | 0 refills | Status: DC

## 2024-05-05 MED ORDER — METHYLPREDNISOLONE ACETATE 40 MG/ML IJ SUSP
40.0000 mg | Freq: Once | INTRAMUSCULAR | Status: AC
  Administered 2024-05-05: 40 mg via INTRAMUSCULAR

## 2024-05-05 MED ORDER — AZELASTINE HCL 137 MCG/SPRAY NA SOLN: 1.0000 | Freq: Two times a day (BID) | NASAL | 1 refills | Status: DC

## 2024-05-05 MED ORDER — BENZONATATE 100 MG PO CAPS: 100.0000 mg | ORAL_CAPSULE | Freq: Two times a day (BID) | ORAL | 0 refills | Status: DC | PRN

## 2024-05-05 NOTE — Progress Notes (Signed)
 History of Present Illness:   Chief Complaint  Patient presents with   Cough    Dry coughing about day or two    Eye Pain    Eye pain redness    Discussed the use of AI scribe software for clinical note transcription with the patient, who gave verbal consent to proceed.  History of Present Illness   Olivia Bender is a 73 year old female who presents with a sore throat and eye redness.  She has acute severe sore throat, with recent onset. She has had recurrent strep throat in the past, including three episodes in one year. At a recent evaluation she only had a COVID test and no strep test. She now has slight fever and sinus congestion.  She has persistent eye redness despite using prescription allergy eye drops and over-the-counter Systane. Her eyes were crusted on waking.  She has nasal congestion with yellow mucus in the morning and clear discharge later in the day. She previously used Xyzal, Nasacort , and sinus rinses twice daily with good effect but has not used them recently.  She has used Pulmicort and Symbicort in the past for respiratory issues but has not used Pulmicort recently because of insurance issues with Symbicort. She has a new Pulmicort inhaler at home and also has a blue inhaler she believes contains cortisone, but she has not used it recently.  Her daughter recently had a similar illness with sore throat and dry cough, but she was not in contact with her during that time.        Past Medical History:  Diagnosis Date   Allergy 1985   Anxiety    Arthritis    Asthma    Constipation    Depression 2007   Enlarged liver    Generalized headaches    since teenage years- usually can get away with tylenol   GERD (gastroesophageal reflux disease)    Hyperlipidemia    Hypertension July 2022   Osteopenia    Thyroid  disease    hypothyroidism     Social History   Tobacco Use   Smoking status: Never   Smokeless tobacco: Never  Substance Use Topics    Alcohol use: No   Drug use: Never    Past Surgical History:  Procedure Laterality Date   arthroscopic left knee surgery     DILATION AND CURETTAGE OF UTERUS     monthlong period related. everything was ok   NASAL SINUS SURGERY  1986    Family History  Problem Relation Age of Onset   Stroke Mother        72. never smoker   Hypertension Mother    Arthritis Mother    Hearing loss Mother    Kidney disease Mother    Miscarriages / Stillbirths Mother    Obesity Mother    Other Father        killed in car wreck 2012   Prostate cancer Father    Cancer Father    Obesity Sister    Kidney cancer Son    Heart disease Maternal Grandmother    Hypertension Maternal Grandmother    Obesity Maternal Grandmother    Heart disease Maternal Grandfather    Hearing loss Maternal Grandfather    Heart disease Maternal Aunt    Obesity Maternal Aunt    Arthritis Sister    Obesity Sister    ADD / ADHD Son     Allergies  Allergen Reactions   Atorvastatin  Other reaction(s): myalgias (muscle pain)   Crestor  [Rosuvastatin ]     Other reaction(s): muscle cramps   Molds & Smuts     Other reaction(s): respiratory distress   Pitavastatin     Other reaction(s): muscle cramps   Tetanus Toxoid-Containing Vaccines Anaphylaxis   Latex Rash   Ezetimibe  Other (See Comments)    Myalgia   Statins Other (See Comments)    Lipitor, Crestor , Livalo -myalgias    Current Medications:   Current Outpatient Medications:    amoxicillin -clavulanate (AUGMENTIN ) 875-125 MG tablet, Take 1 tablet by mouth 2 (two) times daily., Disp: 14 tablet, Rfl: 0   benzonatate  (TESSALON ) 100 MG capsule, Take 1 capsule (100 mg total) by mouth 2 (two) times daily as needed for cough., Disp: 20 capsule, Rfl: 0   albuterol (VENTOLIN HFA) 108 (90 Base) MCG/ACT inhaler, Inhale 2 puffs into the lungs every 4 (four) hours as needed., Disp: , Rfl:    amLODipine  (NORVASC ) 2.5 MG tablet, TAKE 1 TABLET BY MOUTH EVERY DAY, Disp: 90  tablet, Rfl: 3   aspirin 81 MG EC tablet, Take 81 mg by mouth daily., Disp: , Rfl:    Azelastine HCl 137 MCG/SPRAY SOLN, Place 1 spray into the nose in the morning and at bedtime., Disp: 30 mL, Rfl: 1   diclofenac  Sodium (VOLTAREN ) 1 % GEL, Apply 4 g topically 4 (four) times daily., Disp: 100 g, Rfl: 0   inclisiran (LEQVIO ) 284 MG/1.5ML SOSY injection, Inject at 0, 3 months then q 6 months thereafter, Disp: 1.5 mL, Rfl: 0   levocetirizine (XYZAL ALLERGY 24HR) 5 MG tablet, take 1 tablet in the evening once a day orally Orally Once a day; Duration: 30 days, Disp: , Rfl:    levothyroxine  (SYNTHROID ) 75 MCG tablet, TAKE 1 TABLET BY MOUTH EVERY DAY, Disp: 90 tablet, Rfl: 3   metoprolol  succinate (TOPROL -XL) 25 MG 24 hr tablet, TAKE ONE TABLET DAILY WITH AN EXTRA HALF TABLET AS NEEDED FOR BREAKTHROUGH PALPITATIONS, Disp: 135 tablet, Rfl: 2   omeprazole (PRILOSEC OTC) 20 MG tablet, Take 20 mg by mouth as needed., Disp: , Rfl:    Polyethyl Glycol-Propyl Glycol (SYSTANE OP), Apply to eye., Disp: , Rfl:    PULMICORT FLEXHALER 180 MCG/ACT inhaler, Inhale into the lungs., Disp: , Rfl:    sertraline  (ZOLOFT ) 50 MG tablet, TAKE 1 TABLET BY MOUTH EVERY DAY, Disp: 90 tablet, Rfl: 3   triamcinolone  (NASACORT ) 55 MCG/ACT AERO nasal inhaler, Place 2 sprays into the nose daily., Disp: , Rfl:    VITAMIN D  PO, Take by mouth., Disp: , Rfl:    Review of Systems:   Negative unless otherwise specified per HPI.  Vitals:   Vitals:   05/05/24 1519  BP: 138/78  Pulse: 80  Temp: 98 F (36.7 C)  TempSrc: Temporal  SpO2: 98%  Weight: 177 lb 9.6 oz (80.6 kg)  Height: 5' (1.524 m)     Body mass index is 34.69 kg/m.  Physical Exam:   Physical Exam Vitals and nursing note reviewed.  Constitutional:      General: She is not in acute distress.    Appearance: She is well-developed. She is not ill-appearing or toxic-appearing.  HENT:     Head: Normocephalic and atraumatic.     Right Ear: Ear canal and external  ear normal. A middle ear effusion is present. Tympanic membrane is not erythematous, retracted or bulging.     Left Ear: Ear canal and external ear normal. A middle ear effusion is present. Tympanic  membrane is not erythematous, retracted or bulging.     Nose: Nose normal.     Right Sinus: No maxillary sinus tenderness or frontal sinus tenderness.     Left Sinus: No maxillary sinus tenderness or frontal sinus tenderness.     Mouth/Throat:     Pharynx: Uvula midline. Posterior oropharyngeal erythema present.  Eyes:     General: Lids are normal.     Conjunctiva/sclera: Conjunctivae normal.  Neck:     Trachea: Trachea normal.  Cardiovascular:     Rate and Rhythm: Normal rate and regular rhythm.     Pulses: Normal pulses.     Heart sounds: Normal heart sounds, S1 normal and S2 normal.  Pulmonary:     Effort: Pulmonary effort is normal.     Breath sounds: Normal breath sounds. No decreased breath sounds, wheezing, rhonchi or rales.  Lymphadenopathy:     Cervical: No cervical adenopathy.  Skin:    General: Skin is warm and dry.  Neurological:     Mental Status: She is alert.     GCS: GCS eye subscore is 4. GCS verbal subscore is 5. GCS motor subscore is 6.  Psychiatric:        Speech: Speech normal.        Behavior: Behavior normal. Behavior is cooperative.    Results for orders placed or performed in visit on 05/05/24  POCT Influenza A/B  Result Value Ref Range   Influenza A, POC Negative Negative   Influenza B, POC Negative Negative  POC COVID-19 BinaxNow  Result Value Ref Range   SARS Coronavirus 2 Ag Negative Negative    Assessment and Plan:   Assessment and Plan    Sore throat; acute cough Acute pharyngitis with severe throat pain and cough. Negative strep test. Possible viral infection or allergies. Mild fever and sinus congestion with yellow nasal discharge. - Administered steroid injection. - Prescribed antibiotic if no improvement in a few days. - Encouraged  resumption of nasal sprays.  Allergic rhinitis with allergic conjunctivitis Allergic rhinitis with conjunctivitis, red crusty eyes, and nasal congestion. Symptoms suggest allergies, possibly due to environmental factors. - Encouraged use of nasal sprays and eye medications as needed. - Discussed potential need for prednisone  if symptoms worsen.   Lucie Buttner, PA-C

## 2024-05-06 ENCOUNTER — Other Ambulatory Visit: Payer: Self-pay

## 2024-05-06 MED ORDER — SERTRALINE HCL 50 MG PO TABS: 50.0000 mg | ORAL_TABLET | Freq: Every day | ORAL | 3 refills | Status: AC

## 2024-05-06 MED ORDER — AMLODIPINE BESYLATE 2.5 MG PO TABS: 2.5000 mg | ORAL_TABLET | Freq: Every day | ORAL | 3 refills | Status: AC

## 2024-05-06 MED ORDER — LEVOTHYROXINE SODIUM 75 MCG PO TABS: 75.0000 ug | ORAL_TABLET | Freq: Every day | ORAL | 3 refills | Status: AC

## 2024-05-11 MED ORDER — METOPROLOL SUCCINATE ER 25 MG PO TB24: ORAL_TABLET | ORAL | 0 refills | Status: AC

## 2024-05-30 ENCOUNTER — Other Ambulatory Visit: Payer: Self-pay | Admitting: Physician Assistant

## 2024-06-10 ENCOUNTER — Telehealth: Payer: Self-pay

## 2024-06-10 NOTE — Telephone Encounter (Signed)
 Auth Submission: NO AUTH NEEDED Site of care: Site of care: [facility] Payer: medicare a/b & supp Medication & CPT/J Code(s) submitted: Leqvio  (Inclisiran) J1306 Route of submission (phone, fax, portal):  Phone # Fax # Auth type: Buy/Bill Units/visits requested: 284mg  x 2 doses Reference number:  Approval from: 10/09/22 to 07/03/24   Medicare will cover 80% and mutual of [place] will cover remianing 20%

## 2024-06-30 ENCOUNTER — Ambulatory Visit: Admitting: Family Medicine

## 2024-07-08 ENCOUNTER — Encounter: Payer: Self-pay | Admitting: Family Medicine

## 2024-07-08 ENCOUNTER — Ambulatory Visit: Admitting: Family Medicine

## 2024-07-08 ENCOUNTER — Ambulatory Visit: Payer: Self-pay | Admitting: Family Medicine

## 2024-07-08 VITALS — BP 128/68 | HR 65 | Temp 98.1°F | Ht 60.0 in | Wt 177.4 lb

## 2024-07-08 DIAGNOSIS — Z131 Encounter for screening for diabetes mellitus: Secondary | ICD-10-CM

## 2024-07-08 DIAGNOSIS — E039 Hypothyroidism, unspecified: Secondary | ICD-10-CM

## 2024-07-08 DIAGNOSIS — I10 Essential (primary) hypertension: Secondary | ICD-10-CM

## 2024-07-08 DIAGNOSIS — R300 Dysuria: Secondary | ICD-10-CM

## 2024-07-08 DIAGNOSIS — E785 Hyperlipidemia, unspecified: Secondary | ICD-10-CM

## 2024-07-08 DIAGNOSIS — R7303 Prediabetes: Secondary | ICD-10-CM

## 2024-07-08 LAB — LIPID PANEL
Cholesterol: 169 mg/dL (ref 28–200)
HDL: 51.8 mg/dL
LDL Cholesterol: 93 mg/dL (ref 10–99)
NonHDL: 116.73
Total CHOL/HDL Ratio: 3
Triglycerides: 118 mg/dL (ref 10.0–149.0)
VLDL: 23.6 mg/dL (ref 0.0–40.0)

## 2024-07-08 LAB — COMPREHENSIVE METABOLIC PANEL WITH GFR
ALT: 17 U/L (ref 3–35)
AST: 26 U/L (ref 5–37)
Albumin: 4 g/dL (ref 3.5–5.2)
Alkaline Phosphatase: 61 U/L (ref 39–117)
BUN: 15 mg/dL (ref 6–23)
CO2: 31 meq/L (ref 19–32)
Calcium: 9.4 mg/dL (ref 8.4–10.5)
Chloride: 105 meq/L (ref 96–112)
Creatinine, Ser: 0.83 mg/dL (ref 0.40–1.20)
GFR: 69.57 mL/min
Glucose, Bld: 84 mg/dL (ref 70–99)
Potassium: 4.3 meq/L (ref 3.5–5.1)
Sodium: 141 meq/L (ref 135–145)
Total Bilirubin: 0.5 mg/dL (ref 0.2–1.2)
Total Protein: 7.3 g/dL (ref 6.0–8.3)

## 2024-07-08 LAB — CBC WITH DIFFERENTIAL/PLATELET
Basophils Absolute: 0 10*3/uL (ref 0.0–0.1)
Basophils Relative: 0.6 % (ref 0.0–3.0)
Eosinophils Absolute: 0.1 10*3/uL (ref 0.0–0.7)
Eosinophils Relative: 2.2 % (ref 0.0–5.0)
HCT: 35.3 % — ABNORMAL LOW (ref 36.0–46.0)
Hemoglobin: 12 g/dL (ref 12.0–15.0)
Lymphocytes Relative: 27.9 % (ref 12.0–46.0)
Lymphs Abs: 1.5 10*3/uL (ref 0.7–4.0)
MCHC: 33.9 g/dL (ref 30.0–36.0)
MCV: 93.1 fl (ref 78.0–100.0)
Monocytes Absolute: 0.5 10*3/uL (ref 0.1–1.0)
Monocytes Relative: 8.7 % (ref 3.0–12.0)
Neutro Abs: 3.3 10*3/uL (ref 1.4–7.7)
Neutrophils Relative %: 60.6 % (ref 43.0–77.0)
Platelets: 222 10*3/uL (ref 150.0–400.0)
RBC: 3.79 Mil/uL — ABNORMAL LOW (ref 3.87–5.11)
RDW: 13.9 % (ref 11.5–15.5)
WBC: 5.5 10*3/uL (ref 4.0–10.5)

## 2024-07-08 LAB — URINALYSIS, ROUTINE W REFLEX MICROSCOPIC
Bilirubin Urine: NEGATIVE
Hgb urine dipstick: NEGATIVE
Ketones, ur: NEGATIVE
Leukocytes,Ua: NEGATIVE
Nitrite: NEGATIVE
Specific Gravity, Urine: 1.015 (ref 1.000–1.030)
Total Protein, Urine: NEGATIVE
Urine Glucose: NEGATIVE
Urobilinogen, UA: 1 (ref 0.0–1.0)
pH: 7.5 (ref 5.0–8.0)

## 2024-07-08 LAB — HEMOGLOBIN A1C: Hgb A1c MFr Bld: 5.6 % (ref 4.6–6.5)

## 2024-07-08 LAB — TSH: TSH: 1.88 u[IU]/mL (ref 0.35–5.50)

## 2024-07-08 MED ORDER — BUDESONIDE-FORMOTEROL FUMARATE 80-4.5 MCG/ACT IN AERO: 2.0000 | INHALATION_SPRAY | Freq: Two times a day (BID) | RESPIRATORY_TRACT | 3 refills | Status: AC

## 2024-07-08 NOTE — Progress Notes (Signed)
 " Phone [phone] In person visit   Subjective:   Olivia Bender is a 74 y.o. year old very pleasant female patient who presents for/with See problem oriented charting Chief Complaint  Patient presents with   Medical Management of Chronic Issues    6 month follow up; pt feels she may be getting another uti;    Hypertension    Past Medical History-  Patient Active Problem List   Diagnosis Date Noted   Paroxysmal SVT (supraventricular tachycardia) 06/27/2023    Priority: High   Chronic allergic conjunctivitis 11/05/2021    Priority: Medium    Mild intermittent asthma 11/05/2021    Priority: Medium    Essential hypertension 12/10/2019    Priority: Medium    Aortic atherosclerosis 12/11/2018    Priority: Medium    Major depression in full remission 05/18/2018    Priority: Medium    Asthma 11/21/2017    Priority: Medium    Generalized headaches     Priority: Medium    Fatty infiltration of liver 12/30/2014    Priority: Medium    Prediabetes 06/30/2014    Priority: Medium    Hypothyroidism 12/01/2010    Priority: Medium    Hyperlipidemia 12/01/2010    Priority: Medium    Osteopenia 12/01/2010    Priority: Medium    GE reflux 12/01/2010    Priority: Medium    Trochanteric bursitis of left hip 12/11/2015    Priority: Low   Statin intolerance 06/30/2014    Priority: Low   Left knee pain 06/30/2014    Priority: Low   Allergic rhinitis 12/01/2010    Priority: Low   Pulmonary nodule/lesion, solitary 02/21/2020    Medications- reviewed and updated Current Outpatient Medications  Medication Sig Dispense Refill   albuterol (VENTOLIN HFA) 108 (90 Base) MCG/ACT inhaler Inhale 2 puffs into the lungs every 4 (four) hours as needed.     amLODipine  (NORVASC ) 2.5 MG tablet Take 1 tablet (2.5 mg total) by mouth daily. 90 tablet 3   aspirin 81 MG EC tablet Take 81 mg by mouth daily.     Azelastine  HCl 137 MCG/SPRAY SOLN PLACE 1 SPRAY INTO THE NOSE IN THE MORNING AND AT  BEDTIME. 3 mL 3   budesonide -formoterol  (SYMBICORT ) 80-4.5 MCG/ACT inhaler Inhale 2 puffs into the lungs 2 (two) times daily. 3 each 3   diclofenac  Sodium (VOLTAREN ) 1 % GEL Apply 4 g topically 4 (four) times daily. 100 g 0   inclisiran (LEQVIO ) 284 MG/1.5ML SOSY injection Inject at 0, 3 months then q 6 months thereafter 1.5 mL 0   levocetirizine (XYZAL ALLERGY 24HR) 5 MG tablet take 1 tablet in the evening once a day orally Orally Once a day; Duration: 30 days     levothyroxine  (SYNTHROID ) 75 MCG tablet Take 1 tablet (75 mcg total) by mouth daily. 90 tablet 3   metoprolol  succinate (TOPROL -XL) 25 MG 24 hr tablet Take one tablet daily with an extra half tablet as needed for breakthrough palpitations 30 tablet 0   omeprazole (PRILOSEC OTC) 20 MG tablet Take 20 mg by mouth as needed.     Polyethyl Glycol-Propyl Glycol (SYSTANE OP) Apply to eye.     sertraline  (ZOLOFT ) 50 MG tablet Take 1 tablet (50 mg total) by mouth daily. 90 tablet 3   triamcinolone  (NASACORT ) 55 MCG/ACT AERO nasal inhaler Place 2 sprays into the nose daily.     VITAMIN D  PO Take by mouth.     No current facility-administered medications for this  visit.     Objective:  BP 128/68 (BP Location: Left Arm, Patient Position: Sitting, Cuff Size: Normal)   Pulse 65   Temp 98.1 F (36.7 C) (Temporal)   Ht 5' (1.524 m)   Wt 177 lb 6.4 oz (80.5 kg)   SpO2 96%   BMI 34.65 kg/m  Gen: NAD, resting comfortably CV: RRR no murmurs rubs or gallops Lungs: CTAB no crackles, wheeze, rhonchi Abdomen: soft/nontender/nondistended/normal bowel sounds. No rebound or guarding.  Ext: no edema Skin: warm, dry     Assessment and Plan   #social update- enjoying yorkie puppy!   #CBC- mild anemia in December- mild intermittent - check with labs  #Concern for UTI S: Patients symptoms started in last week.  Had urinary tract infection treatment(s) in late November- had cleared by December visit with Lucie buttner, PA at our office.   Complains of dysuria: yes; polyuria: yes; nocturia: yes; some leakage/incontinence Symptoms are slightly worsening. Some odor- no discharge from vagina ROS- no fever, chills, nausea, vomiting. Mild back pain.  No suprapubic pain.   No blood in urine.   A/P: UA will be obtained= suspect UTI. Will get culture. Empiric treatment she's ok holding off to make sure right antibiotic(s).  Patient to follow up if new or worsening symptoms or failure to improve.    # Paroxysmal SVT/palpitations-started after COVID in 2024 #hypertension S: medication: amlodipine  2.5 mg, metoprolol  25 mg extended release - palpitations at bay BP Readings from Last 3 Encounters:  07/08/24 128/68  05/05/24 138/78  05/03/24 132/84  A/P: blood pressure well controlled continue current medications   #hyperlipidemia with aortic atherosclerosis S: Medication:Inclisiran through cardiology injections every 6 months, aspirin 81 mg Lab Results  Component Value Date   CHOL 159 06/27/2023   HDL 49.80 06/27/2023   LDLCALC 76 06/27/2023   LDLDIRECT 184.0 11/21/2017   TRIG 168.0 (H) 06/27/2023   CHOLHDL 3 06/27/2023   A/P: lipids hopefully stable to improved- prefer LDL under 70 and slightly high at 76 last visit- update today  #hypothyroidism S: compliant On thyroid  medication- levothyroxine  75 mcg        Lab Results  Component Value Date   TSH 1.24 12/25/2023  A/P:hopefully stable- update tsh today. Continue current meds for now    # Asthma- Dr. Fleeta Smock S: Maintenance Medication: Symbicort  80-4.5 2 puffs twice daily- but also SMART therapy- unfortunately insurance was not covering- but now does- she wants to retry- was not doing the pulmicort  A/P: she's been doing well- she's ok to just use Symbicort  as needed form my perspective   # Depression  S: Medication: sertraline  50 mg -Lost daughter in law as well in late 2025 -sister in law also sick    07/08/2024    9:20 AM 03/18/2024   10:12 AM 12/25/2023   10:02  AM  Depression screen PHQ 2/9  Decreased Interest 1 0 3  Down, Depressed, Hopeless 1 0 3  PHQ - 2 Score 2 0 6  Altered sleeping 1  1  Tired, decreased energy 1  1  Change in appetite 0  0  Feeling bad or failure about yourself  0  0  Trouble concentrating 0  0  Moving slowly or fidgety/restless 0  0  Suicidal thoughts 0  0  PHQ-9 Score 4  8   Difficult doing work/chores Somewhat difficult  Somewhat difficult     Data saved with a previous flowsheet row definition  A/P: reasonable control with recent losses- continue current  medications - she's ok holding steady  # Hyperglycemia/insulin resistance/prediabetes- a1c up to 6.1 S:  Medication: none Lab Results  Component Value Date   HGBA1C 5.8 12/25/2023   HGBA1C 6.1 06/27/2023   HGBA1C 6.0 09/20/2022     A/P: hopefully stable- update a1c today. Continue without meds for now - glad improving  #allergies- astelin  and claritin 10 mg as needed  #umbilical hernia- she wants to monitor for now but if worsens in pain can refer to general surgery - not too bad of issues lately  #osteopenia- worst to score -1.1 very mild- continue to monitor.  -actually reports actonel years ago- technically could label osteoposis  Recommended follow up: Return in about 6 months (around 01/05/2025) for followup or sooner if needed.Schedule b4 you leave. Future Appointments  Date Time Provider Department Center  07/30/2024  9:30 AM [facility] None  03/28/2025 10:00 AM [facility] ANNUAL WELLNESS VISIT 1 [facility] [place]   Lab/Order associations: fasting   ICD-10-CM   1. Essential hypertension  I10     2. Hyperlipidemia, unspecified hyperlipidemia type  E78.5 Comprehensive metabolic panel with GFR    CBC with Differential/Platelet    Lipid panel    3. Hypothyroidism, unspecified type  E03.9 TSH    4. Prediabetes  R73.03 Hemoglobin A1c    5. Screening for diabetes mellitus  Z13.1 Hemoglobin A1c    6. Dysuria  R30.0 Urinalysis,  Routine w reflex microscopic    Urine Culture      Meds ordered this encounter  Medications   budesonide -formoterol  (SYMBICORT ) 80-4.5 MCG/ACT inhaler    Sig: Inhale 2 puffs into the lungs 2 (two) times daily.    Dispense:  3 each    Refill:  3    Return precautions advised.  Garnette Lukes, MD  "

## 2024-07-08 NOTE — Patient Instructions (Addendum)
 Please stop by lab before you go If you have mychart- we will send your results within 3 business days of us  receiving them.  If you do not have mychart- we will call you about results within 5 business days of us  receiving them.  *please also note that you will see labs on mychart as soon as they post. I will later go in and write notes on them- will say notes from Dr. Katrinka   No changes today unless labs lead us  to make changes  I did refill Symbicort  since that helps you when ill  Recommended follow up: Return in about 6 months (around 01/05/2025) for followup or sooner if needed.Schedule b4 you leave.

## 2024-07-09 LAB — URINE CULTURE
MICRO NUMBER:: 17553682
Result:: NO GROWTH
SPECIMEN QUALITY:: ADEQUATE

## 2024-07-09 NOTE — Progress Notes (Signed)
 Last read by Renelda SHAUNNA Schiller at 7:46PM on 07/08/2024.

## 2024-07-30 ENCOUNTER — Ambulatory Visit

## 2025-01-05 ENCOUNTER — Ambulatory Visit: Admitting: Family Medicine

## 2025-03-28 ENCOUNTER — Ambulatory Visit
